# Patient Record
Sex: Female | Born: 1965 | Race: White | Hispanic: No | Smoking: Current every day smoker
Health system: Southern US, Community
[De-identification: ages and names within clinical notes are randomized; demographics above are authoritative.]

## PROBLEM LIST (undated history)

## (undated) ENCOUNTER — Ambulatory Visit

## (undated) ENCOUNTER — Encounter
Attending: Student in an Organized Health Care Education/Training Program | Primary: Student in an Organized Health Care Education/Training Program

## (undated) ENCOUNTER — Ambulatory Visit: Payer: MEDICARE

## (undated) ENCOUNTER — Telehealth

## (undated) ENCOUNTER — Telehealth: Attending: Hematology & Oncology | Primary: Hematology & Oncology

## (undated) ENCOUNTER — Ambulatory Visit: Payer: MEDICARE | Attending: Dermatology | Primary: Dermatology

## (undated) DIAGNOSIS — M5090 Cervical disc disorder, unspecified, unspecified cervical region: Secondary | ICD-10-CM

## (undated) DIAGNOSIS — S2239XA Fracture of one rib, unspecified side, initial encounter for closed fracture: Secondary | ICD-10-CM

## (undated) DIAGNOSIS — M4316 Spondylolisthesis, lumbar region: Secondary | ICD-10-CM

## (undated) DIAGNOSIS — M4310 Spondylolisthesis, site unspecified: Secondary | ICD-10-CM

## (undated) DIAGNOSIS — E119 Type 2 diabetes mellitus without complications: Secondary | ICD-10-CM

## (undated) DIAGNOSIS — F329 Major depressive disorder, single episode, unspecified: Secondary | ICD-10-CM

## (undated) DIAGNOSIS — K219 Gastro-esophageal reflux disease without esophagitis: Secondary | ICD-10-CM

## (undated) DIAGNOSIS — F32A Depression, unspecified: Secondary | ICD-10-CM

## (undated) DIAGNOSIS — E1140 Type 2 diabetes mellitus with diabetic neuropathy, unspecified: Secondary | ICD-10-CM

## (undated) DIAGNOSIS — I10 Essential (primary) hypertension: Secondary | ICD-10-CM

## (undated) DIAGNOSIS — J302 Other seasonal allergic rhinitis: Secondary | ICD-10-CM

## (undated) DIAGNOSIS — G971 Other reaction to spinal and lumbar puncture: Secondary | ICD-10-CM

## (undated) DIAGNOSIS — K259 Gastric ulcer, unspecified as acute or chronic, without hemorrhage or perforation: Secondary | ICD-10-CM

## (undated) DIAGNOSIS — M1990 Unspecified osteoarthritis, unspecified site: Secondary | ICD-10-CM

## (undated) HISTORY — PX: CATARACT EXTRACTION: SUR2

## (undated) HISTORY — PX: DIAGNOSTIC LAPAROSCOPY: SUR761

## (undated) HISTORY — PX: BACK SURGERY: SHX140

## (undated) HISTORY — PX: GASTRIC BYPASS: SHX52

## (undated) HISTORY — DX: Spondylolisthesis, lumbar region: M43.16

## (undated) HISTORY — PX: BREAST REDUCTION SURGERY: SHX8

## (undated) HISTORY — PX: APPENDECTOMY: SHX54

## (undated) HISTORY — PX: HERNIA REPAIR: SHX51

## (undated) HISTORY — PX: WISDOM TOOTH EXTRACTION: SHX21

## (undated) HISTORY — PX: CARPAL TUNNEL RELEASE: SHX101

## (undated) HISTORY — PX: COLONOSCOPY W/ BIOPSIES AND POLYPECTOMY: SHX1376

## (undated) HISTORY — PX: OTHER SURGICAL HISTORY: SHX169

## (undated) HISTORY — PX: KNEE ARTHROSCOPY: SUR90

## (undated) HISTORY — PX: JOINT REPLACEMENT: SHX530

## (undated) HISTORY — PX: CHOLECYSTECTOMY: SHX55

## (undated) HISTORY — DX: Cervical disc disorder, unspecified, unspecified cervical region: M50.90

---

## 2005-02-23 ENCOUNTER — Ambulatory Visit: Payer: Self-pay | Admitting: Internal Medicine

## 2005-04-10 ENCOUNTER — Ambulatory Visit: Payer: Self-pay | Admitting: Gastroenterology

## 2005-04-12 ENCOUNTER — Ambulatory Visit: Payer: Self-pay | Admitting: Gastroenterology

## 2005-10-19 ENCOUNTER — Encounter: Payer: Self-pay | Admitting: General Practice

## 2005-11-15 ENCOUNTER — Encounter: Payer: Self-pay | Admitting: General Practice

## 2006-05-08 ENCOUNTER — Emergency Department: Payer: Self-pay | Admitting: Emergency Medicine

## 2006-07-17 ENCOUNTER — Emergency Department: Payer: Self-pay | Admitting: Emergency Medicine

## 2006-11-20 ENCOUNTER — Emergency Department: Payer: Self-pay | Admitting: Emergency Medicine

## 2006-12-17 ENCOUNTER — Emergency Department: Payer: Self-pay | Admitting: Unknown Physician Specialty

## 2007-01-18 ENCOUNTER — Ambulatory Visit: Payer: Self-pay | Admitting: Internal Medicine

## 2007-02-08 ENCOUNTER — Emergency Department: Payer: Self-pay | Admitting: Internal Medicine

## 2007-08-01 ENCOUNTER — Ambulatory Visit: Payer: Self-pay | Admitting: Cardiology

## 2008-08-22 ENCOUNTER — Emergency Department: Payer: Self-pay | Admitting: Internal Medicine

## 2008-10-09 ENCOUNTER — Emergency Department: Payer: Self-pay | Admitting: Emergency Medicine

## 2008-10-14 ENCOUNTER — Ambulatory Visit: Payer: Self-pay | Admitting: Unknown Physician Specialty

## 2008-10-19 ENCOUNTER — Ambulatory Visit: Payer: Self-pay | Admitting: Unknown Physician Specialty

## 2008-10-29 ENCOUNTER — Ambulatory Visit: Payer: Self-pay | Admitting: Unknown Physician Specialty

## 2008-11-23 ENCOUNTER — Ambulatory Visit: Payer: Self-pay | Admitting: Unknown Physician Specialty

## 2009-02-01 ENCOUNTER — Ambulatory Visit: Payer: Self-pay

## 2009-03-11 ENCOUNTER — Ambulatory Visit: Payer: Self-pay

## 2009-04-06 ENCOUNTER — Emergency Department: Payer: Self-pay | Admitting: Emergency Medicine

## 2009-04-12 ENCOUNTER — Ambulatory Visit: Payer: Self-pay | Admitting: Unknown Physician Specialty

## 2009-06-10 ENCOUNTER — Ambulatory Visit: Payer: Self-pay | Admitting: Internal Medicine

## 2009-06-23 ENCOUNTER — Ambulatory Visit: Payer: Self-pay | Admitting: Unknown Physician Specialty

## 2009-08-10 ENCOUNTER — Ambulatory Visit: Payer: Self-pay | Admitting: Ophthalmology

## 2009-08-13 ENCOUNTER — Ambulatory Visit: Payer: Self-pay | Admitting: Unknown Physician Specialty

## 2009-08-23 ENCOUNTER — Ambulatory Visit: Payer: Self-pay | Admitting: Ophthalmology

## 2009-08-25 ENCOUNTER — Ambulatory Visit: Payer: Self-pay | Admitting: Unknown Physician Specialty

## 2009-08-31 ENCOUNTER — Ambulatory Visit: Payer: Self-pay | Admitting: Unknown Physician Specialty

## 2009-09-29 ENCOUNTER — Inpatient Hospital Stay: Payer: Self-pay | Admitting: Internal Medicine

## 2009-10-16 ENCOUNTER — Emergency Department: Payer: Self-pay | Admitting: Internal Medicine

## 2010-10-04 ENCOUNTER — Emergency Department: Payer: Self-pay | Admitting: Emergency Medicine

## 2011-10-12 ENCOUNTER — Emergency Department: Payer: Self-pay | Admitting: Emergency Medicine

## 2012-03-24 ENCOUNTER — Ambulatory Visit: Payer: Self-pay | Admitting: Unknown Physician Specialty

## 2012-05-01 ENCOUNTER — Ambulatory Visit: Payer: Self-pay | Admitting: Specialist

## 2012-05-08 ENCOUNTER — Ambulatory Visit: Payer: Self-pay | Admitting: Specialist

## 2012-05-08 DIAGNOSIS — I10 Essential (primary) hypertension: Secondary | ICD-10-CM

## 2012-05-08 LAB — COMPREHENSIVE METABOLIC PANEL
Alkaline Phosphatase: 82 U/L (ref 50–136)
Anion Gap: 8 (ref 7–16)
Bilirubin,Total: 0.3 mg/dL (ref 0.2–1.0)
Chloride: 106 mmol/L (ref 98–107)
Co2: 27 mmol/L (ref 21–32)
Creatinine: 0.7 mg/dL (ref 0.60–1.30)
EGFR (African American): >60
EGFR (Non-African Amer.): >60
Glucose: 162 mg/dL — ABNORMAL HIGH (ref 65–99)
Osmolality: 284 (ref 275–301)
Potassium: 3.9 mmol/L (ref 3.5–5.1)
SGPT (ALT): 31 U/L
Sodium: 141 mmol/L (ref 136–145)

## 2012-05-08 LAB — CBC WITH DIFFERENTIAL/PLATELET
Basophil %: 0.7 %
Eosinophil #: 0.5 10*3/uL (ref 0.0–0.7)
Eosinophil %: 3.9 %
HGB: 14.1 g/dL (ref 12.0–16.0)
Lymphocyte %: 28.7 %
MCH: 28.9 pg (ref 26.0–34.0)
MCHC: 33.5 g/dL (ref 32.0–36.0)
MCV: 86 fL (ref 80–100)
Monocyte #: 0.7 x10 3/mm (ref 0.2–0.9)
Neutrophil #: 7.4 10*3/uL — ABNORMAL HIGH (ref 1.4–6.5)
RBC: 4.88 10*6/uL (ref 3.80–5.20)
RDW: 14.2 % (ref 11.5–14.5)

## 2012-05-08 LAB — IRON AND TIBC
Iron Bind.Cap.(Total): 330 ug/dL (ref 250–450)
Iron Saturation: 17 %
Iron: 56 ug/dL (ref 50–170)

## 2012-05-08 LAB — BILIRUBIN, DIRECT: Bilirubin, Direct: <0.05 mg/dL (ref 0.00–0.20)

## 2012-05-08 LAB — LIPASE, BLOOD: Lipase: 105 U/L (ref 73–393)

## 2012-05-08 LAB — PROTIME-INR
INR: 0.8
Prothrombin Time: 11.5 secs (ref 11.5–14.7)

## 2012-05-08 LAB — AMYLASE: Amylase: 35 U/L (ref 25–115)

## 2012-05-08 LAB — HEMOGLOBIN A1C: Hemoglobin A1C: 8.4 % — ABNORMAL HIGH (ref 4.2–6.3)

## 2012-06-09 ENCOUNTER — Ambulatory Visit: Payer: Self-pay | Admitting: Specialist

## 2012-06-18 ENCOUNTER — Ambulatory Visit: Payer: Self-pay | Admitting: Unknown Physician Specialty

## 2012-06-18 ENCOUNTER — Other Ambulatory Visit: Payer: Self-pay | Admitting: Internal Medicine

## 2012-06-18 LAB — COMPREHENSIVE METABOLIC PANEL
Albumin: 3.1 g/dL — ABNORMAL LOW (ref 3.4–5.0)
Alkaline Phosphatase: 74 U/L (ref 50–136)
Anion Gap: 7 (ref 7–16)
BUN: 15 mg/dL (ref 7–18)
Bilirubin,Total: 0.2 mg/dL (ref 0.2–1.0)
Chloride: 103 mmol/L (ref 98–107)
Creatinine: 0.91 mg/dL (ref 0.60–1.30)
Glucose: 252 mg/dL — ABNORMAL HIGH (ref 65–99)
Osmolality: 279 (ref 275–301)
SGOT(AST): 23 U/L (ref 15–37)
SGPT (ALT): 21 U/L (ref 12–78)
Sodium: 135 mmol/L — ABNORMAL LOW (ref 136–145)
Total Protein: 7 g/dL (ref 6.4–8.2)

## 2012-06-18 LAB — URINALYSIS, COMPLETE
Bilirubin,UR: NEGATIVE
Glucose,UR: 50 mg/dL (ref 0–75)
Ketone: NEGATIVE
Protein: NEGATIVE
WBC UR: 6 /HPF (ref 0–5)

## 2012-06-18 LAB — HEMOGLOBIN A1C: Hemoglobin A1C: 8.7 % — ABNORMAL HIGH (ref 4.2–6.3)

## 2012-06-18 LAB — CBC WITH DIFFERENTIAL/PLATELET
Basophil %: 1.3 %
HCT: 39.4 % (ref 35.0–47.0)
HGB: 13.4 g/dL (ref 12.0–16.0)
Lymphocyte #: 3.5 10*3/uL (ref 1.0–3.6)
Lymphocyte %: 24.5 %
MCH: 29.3 pg (ref 26.0–34.0)
Monocyte %: 5.8 %
Neutrophil #: 8.4 10*3/uL — ABNORMAL HIGH (ref 1.4–6.5)
Platelet: 199 10*3/uL (ref 150–440)
RBC: 4.56 10*6/uL (ref 3.80–5.20)
RDW: 14.3 % (ref 11.5–14.5)
WBC: 14.1 10*3/uL — ABNORMAL HIGH (ref 3.6–11.0)

## 2012-06-18 LAB — PROTIME-INR: Prothrombin Time: 12.9 secs (ref 11.5–14.7)

## 2012-06-18 LAB — LIPID PANEL

## 2012-06-18 LAB — MRSA PCR SCREENING

## 2012-06-19 ENCOUNTER — Ambulatory Visit: Payer: Self-pay | Admitting: Specialist

## 2012-06-25 ENCOUNTER — Ambulatory Visit: Payer: Self-pay | Admitting: Gastroenterology

## 2012-07-02 ENCOUNTER — Inpatient Hospital Stay: Payer: Self-pay | Admitting: Unknown Physician Specialty

## 2012-07-02 LAB — ELECTROLYTE PANEL
Anion Gap: 8 (ref 7–16)
Chloride: 107 mmol/L (ref 98–107)
Potassium: 4.4 mmol/L (ref 3.5–5.1)

## 2012-07-03 LAB — BASIC METABOLIC PANEL
Anion Gap: 10 (ref 7–16)
BUN: 18 mg/dL (ref 7–18)
Calcium, Total: 7.9 mg/dL — ABNORMAL LOW (ref 8.5–10.1)
Chloride: 102 mmol/L (ref 98–107)
EGFR (Non-African Amer.): >60
Glucose: 253 mg/dL — ABNORMAL HIGH (ref 65–99)
Osmolality: 281 (ref 275–301)
Potassium: 4.2 mmol/L (ref 3.5–5.1)

## 2012-07-04 LAB — COMPREHENSIVE METABOLIC PANEL
Albumin: 2.9 g/dL — ABNORMAL LOW (ref 3.4–5.0)
Alkaline Phosphatase: 71 U/L (ref 50–136)
Anion Gap: 11 (ref 7–16)
BUN: 7 mg/dL (ref 7–18)
Bilirubin,Total: 0.4 mg/dL (ref 0.2–1.0)
Glucose: 245 mg/dL — ABNORMAL HIGH (ref 65–99)
Osmolality: 282 (ref 275–301)
Potassium: 4.1 mmol/L (ref 3.5–5.1)
Sodium: 138 mmol/L (ref 136–145)
Total Protein: 6.8 g/dL (ref 6.4–8.2)

## 2012-07-04 LAB — CBC WITH DIFFERENTIAL/PLATELET
Basophil #: 0.1 10*3/uL (ref 0.0–0.1)
Eosinophil %: 7.8 %
Lymphocyte %: 17.8 %
MCH: 28.7 pg (ref 26.0–34.0)
MCHC: 32.9 g/dL (ref 32.0–36.0)
MCV: 87 fL (ref 80–100)
Monocyte #: 1.6 x10 3/mm — ABNORMAL HIGH (ref 0.2–0.9)
Monocyte %: 11.1 %
Neutrophil %: 62.4 %
Platelet: 173 10*3/uL (ref 150–440)

## 2012-07-04 LAB — TROPONIN I: Troponin-I: <0.02 ng/mL

## 2012-12-06 LAB — CBC
HCT: 42.6 % (ref 35.0–47.0)
HGB: 14.1 g/dL (ref 12.0–16.0)
MCH: 27.6 pg (ref 26.0–34.0)
MCHC: 33.1 g/dL (ref 32.0–36.0)
MCV: 84 fL (ref 80–100)
WBC: 14.7 10*3/uL — ABNORMAL HIGH (ref 3.6–11.0)

## 2012-12-06 LAB — BASIC METABOLIC PANEL
Anion Gap: 9 (ref 7–16)
Chloride: 99 mmol/L (ref 98–107)
EGFR (Non-African Amer.): >60
Osmolality: 280 (ref 275–301)
Potassium: 4.2 mmol/L (ref 3.5–5.1)
Sodium: 132 mmol/L — ABNORMAL LOW (ref 136–145)

## 2012-12-07 ENCOUNTER — Inpatient Hospital Stay: Payer: Self-pay | Admitting: Surgery

## 2012-12-12 LAB — CULTURE, BLOOD (SINGLE)

## 2012-12-25 ENCOUNTER — Other Ambulatory Visit: Payer: Self-pay | Admitting: Specialist

## 2012-12-26 ENCOUNTER — Ambulatory Visit: Payer: Self-pay | Admitting: Specialist

## 2013-07-30 ENCOUNTER — Ambulatory Visit: Payer: Self-pay | Admitting: Specialist

## 2013-08-15 ENCOUNTER — Ambulatory Visit: Payer: Self-pay | Admitting: Specialist

## 2014-02-08 DIAGNOSIS — M5090 Cervical disc disorder, unspecified, unspecified cervical region: Secondary | ICD-10-CM

## 2014-02-08 DIAGNOSIS — E109 Type 1 diabetes mellitus without complications: Secondary | ICD-10-CM | POA: Insufficient documentation

## 2014-02-08 DIAGNOSIS — F17200 Nicotine dependence, unspecified, uncomplicated: Secondary | ICD-10-CM | POA: Insufficient documentation

## 2014-02-08 DIAGNOSIS — G939 Disorder of brain, unspecified: Secondary | ICD-10-CM | POA: Insufficient documentation

## 2014-02-08 DIAGNOSIS — I6782 Cerebral ischemia: Secondary | ICD-10-CM | POA: Insufficient documentation

## 2014-02-08 DIAGNOSIS — I10 Essential (primary) hypertension: Secondary | ICD-10-CM | POA: Insufficient documentation

## 2014-02-08 HISTORY — DX: Cervical disc disorder, unspecified, unspecified cervical region: M50.90

## 2014-04-09 ENCOUNTER — Ambulatory Visit: Payer: Self-pay | Admitting: Internal Medicine

## 2014-04-23 ENCOUNTER — Ambulatory Visit: Payer: Self-pay | Admitting: Internal Medicine

## 2014-04-23 ENCOUNTER — Emergency Department: Payer: Self-pay | Admitting: Emergency Medicine

## 2014-04-23 LAB — COMPREHENSIVE METABOLIC PANEL
ALBUMIN: 3.2 g/dL — AB (ref 3.4–5.0)
ALK PHOS: 80 U/L
ANION GAP: 8 (ref 7–16)
BUN: 8 mg/dL (ref 7–18)
Bilirubin,Total: 0.3 mg/dL (ref 0.2–1.0)
Calcium, Total: 8.9 mg/dL (ref 8.5–10.1)
Chloride: 104 mmol/L (ref 98–107)
Co2: 26 mmol/L (ref 21–32)
Creatinine: 0.9 mg/dL (ref 0.60–1.30)
EGFR (African American): >60
EGFR (Non-African Amer.): >60
GLUCOSE: 162 mg/dL — AB (ref 65–99)
Osmolality: 278 (ref 275–301)
Potassium: 3.4 mmol/L — ABNORMAL LOW (ref 3.5–5.1)
SGOT(AST): 17 U/L (ref 15–37)
SGPT (ALT): 15 U/L (ref 12–78)
Sodium: 138 mmol/L (ref 136–145)
Total Protein: 7.8 g/dL (ref 6.4–8.2)

## 2014-04-23 LAB — CBC WITH DIFFERENTIAL/PLATELET
BASOS PCT: 0.8 %
Basophil #: 0.1 10*3/uL (ref 0.0–0.1)
Eosinophil #: 0.5 10*3/uL (ref 0.0–0.7)
Eosinophil %: 4.1 %
HCT: 46.5 % (ref 35.0–47.0)
HGB: 15.2 g/dL (ref 12.0–16.0)
Lymphocyte #: 3.8 10*3/uL — ABNORMAL HIGH (ref 1.0–3.6)
Lymphocyte %: 28.1 %
MCH: 27.4 pg (ref 26.0–34.0)
MCHC: 32.7 g/dL (ref 32.0–36.0)
MCV: 84 fL (ref 80–100)
MONOS PCT: 6.3 %
Monocyte #: 0.9 x10 3/mm (ref 0.2–0.9)
Neutrophil #: 8.2 10*3/uL — ABNORMAL HIGH (ref 1.4–6.5)
Neutrophil %: 60.7 %
PLATELETS: 222 10*3/uL (ref 150–440)
RBC: 5.54 10*6/uL — ABNORMAL HIGH (ref 3.80–5.20)
RDW: 14.1 % (ref 11.5–14.5)
WBC: 13.5 10*3/uL — ABNORMAL HIGH (ref 3.6–11.0)

## 2014-04-23 LAB — URINALYSIS, COMPLETE
Blood: NEGATIVE
GLUCOSE, UR: NEGATIVE mg/dL (ref 0–75)
Ketone: NEGATIVE
Nitrite: NEGATIVE
Ph: 5 (ref 4.5–8.0)
Protein: NEGATIVE
RBC,UR: 9 /HPF (ref 0–5)
SPECIFIC GRAVITY: 1.029 (ref 1.003–1.030)
Squamous Epithelial: 15
WBC UR: 38 /HPF (ref 0–5)

## 2014-04-23 LAB — LIPASE, BLOOD: LIPASE: 86 U/L (ref 73–393)

## 2014-05-10 ENCOUNTER — Encounter: Payer: Self-pay | Admitting: Neurology

## 2014-05-15 ENCOUNTER — Encounter: Payer: Self-pay | Admitting: Neurology

## 2014-06-07 ENCOUNTER — Ambulatory Visit: Payer: Self-pay | Admitting: Surgery

## 2014-06-07 LAB — POTASSIUM: Potassium: 4.4 mmol/L (ref 3.5–5.1)

## 2014-06-10 ENCOUNTER — Ambulatory Visit: Payer: Self-pay | Admitting: Surgery

## 2014-06-11 LAB — PATHOLOGY REPORT

## 2014-06-15 ENCOUNTER — Encounter: Payer: Self-pay | Admitting: Neurology

## 2014-06-24 ENCOUNTER — Emergency Department: Payer: Self-pay | Admitting: Emergency Medicine

## 2014-06-24 LAB — COMPREHENSIVE METABOLIC PANEL
ALBUMIN: 3.2 g/dL — AB (ref 3.4–5.0)
ALT: 22 U/L
AST: 23 U/L (ref 15–37)
Alkaline Phosphatase: 74 U/L
Anion Gap: 9 (ref 7–16)
BUN: 8 mg/dL (ref 7–18)
Bilirubin,Total: 0.2 mg/dL (ref 0.2–1.0)
CALCIUM: 8.9 mg/dL (ref 8.5–10.1)
CHLORIDE: 110 mmol/L — AB (ref 98–107)
CO2: 22 mmol/L (ref 21–32)
CREATININE: 0.78 mg/dL (ref 0.60–1.30)
EGFR (Non-African Amer.): >60
Glucose: 135 mg/dL — ABNORMAL HIGH (ref 65–99)
OSMOLALITY: 282 (ref 275–301)
Potassium: 4 mmol/L (ref 3.5–5.1)
Sodium: 141 mmol/L (ref 136–145)
Total Protein: 7.4 g/dL (ref 6.4–8.2)

## 2014-06-24 LAB — URINALYSIS, COMPLETE
BILIRUBIN, UR: NEGATIVE
BLOOD: NEGATIVE
GLUCOSE, UR: NEGATIVE mg/dL (ref 0–75)
Ketone: NEGATIVE
NITRITE: NEGATIVE
PH: 5 (ref 4.5–8.0)
Protein: NEGATIVE
SPECIFIC GRAVITY: 1.018 (ref 1.003–1.030)
Squamous Epithelial: 9
WBC UR: 40 /HPF (ref 0–5)

## 2014-06-24 LAB — LIPASE, BLOOD: LIPASE: 95 U/L (ref 73–393)

## 2014-06-24 LAB — CBC
HCT: 47.5 % — ABNORMAL HIGH (ref 35.0–47.0)
HGB: 15.5 g/dL (ref 12.0–16.0)
MCH: 28.2 pg (ref 26.0–34.0)
MCHC: 32.7 g/dL (ref 32.0–36.0)
MCV: 86 fL (ref 80–100)
PLATELETS: 225 10*3/uL (ref 150–440)
RBC: 5.51 10*6/uL — ABNORMAL HIGH (ref 3.80–5.20)
RDW: 14.1 % (ref 11.5–14.5)
WBC: 10.5 10*3/uL (ref 3.6–11.0)

## 2014-06-29 ENCOUNTER — Ambulatory Visit: Payer: Self-pay | Admitting: Surgery

## 2014-07-30 ENCOUNTER — Ambulatory Visit: Payer: Self-pay | Admitting: Gastroenterology

## 2014-08-31 ENCOUNTER — Emergency Department: Payer: Self-pay | Admitting: Emergency Medicine

## 2014-08-31 LAB — URINALYSIS, COMPLETE
Bilirubin,UR: NEGATIVE
Blood: NEGATIVE
Glucose,UR: NEGATIVE mg/dL (ref 0–75)
KETONE: NEGATIVE
Leukocyte Esterase: NEGATIVE
NITRITE: NEGATIVE
PH: 5 (ref 4.5–8.0)
Protein: NEGATIVE
SPECIFIC GRAVITY: 1.014 (ref 1.003–1.030)
WBC UR: 3 /HPF (ref 0–5)

## 2014-08-31 LAB — CBC WITH DIFFERENTIAL/PLATELET
Basophil #: 0.1 10*3/uL (ref 0.0–0.1)
Basophil %: 0.9 %
EOS ABS: 1.1 10*3/uL — AB (ref 0.0–0.7)
Eosinophil %: 8.1 %
HCT: 50 % — ABNORMAL HIGH (ref 35.0–47.0)
HGB: 16.3 g/dL — AB (ref 12.0–16.0)
Lymphocyte #: 3.5 10*3/uL (ref 1.0–3.6)
Lymphocyte %: 26.8 %
MCH: 28.2 pg (ref 26.0–34.0)
MCHC: 32.6 g/dL (ref 32.0–36.0)
MCV: 87 fL (ref 80–100)
Monocyte #: 0.7 x10 3/mm (ref 0.2–0.9)
Monocyte %: 4.9 %
NEUTROS ABS: 7.9 10*3/uL — AB (ref 1.4–6.5)
Neutrophil %: 59.3 %
Platelet: 254 10*3/uL (ref 150–440)
RBC: 5.77 10*6/uL — AB (ref 3.80–5.20)
RDW: 13.2 % (ref 11.5–14.5)
WBC: 13.2 10*3/uL — ABNORMAL HIGH (ref 3.6–11.0)

## 2014-08-31 LAB — COMPREHENSIVE METABOLIC PANEL
ALBUMIN: 3.9 g/dL (ref 3.4–5.0)
ALT: 25 U/L
Alkaline Phosphatase: 79 U/L
Anion Gap: 4 — ABNORMAL LOW (ref 7–16)
BUN: 9 mg/dL (ref 7–18)
Bilirubin,Total: 0.4 mg/dL (ref 0.2–1.0)
CO2: 27 mmol/L (ref 21–32)
Calcium, Total: 8.8 mg/dL (ref 8.5–10.1)
Chloride: 105 mmol/L (ref 98–107)
Creatinine: 0.9 mg/dL (ref 0.60–1.30)
Glucose: 134 mg/dL — ABNORMAL HIGH (ref 65–99)
OSMOLALITY: 273 (ref 275–301)
Potassium: 3.8 mmol/L (ref 3.5–5.1)
SGOT(AST): 17 U/L (ref 15–37)
SODIUM: 136 mmol/L (ref 136–145)
TOTAL PROTEIN: 8.1 g/dL (ref 6.4–8.2)

## 2014-08-31 LAB — LIPASE, BLOOD: Lipase: 85 U/L (ref 73–393)

## 2014-09-02 LAB — URINE CULTURE

## 2014-09-23 ENCOUNTER — Emergency Department: Payer: Self-pay | Admitting: Emergency Medicine

## 2014-09-23 LAB — COMPREHENSIVE METABOLIC PANEL
ALBUMIN: 3.5 g/dL (ref 3.4–5.0)
ANION GAP: 7 (ref 7–16)
AST: 23 U/L (ref 15–37)
Alkaline Phosphatase: 84 U/L
BILIRUBIN TOTAL: 0.4 mg/dL (ref 0.2–1.0)
BUN: 7 mg/dL (ref 7–18)
CO2: 27 mmol/L (ref 21–32)
CREATININE: 0.79 mg/dL (ref 0.60–1.30)
Calcium, Total: 8.6 mg/dL (ref 8.5–10.1)
Chloride: 106 mmol/L (ref 98–107)
GLUCOSE: 153 mg/dL — AB (ref 65–99)
Osmolality: 280 (ref 275–301)
Potassium: 3.8 mmol/L (ref 3.5–5.1)
SGPT (ALT): 26 U/L
Sodium: 140 mmol/L (ref 136–145)
Total Protein: 8 g/dL (ref 6.4–8.2)

## 2014-09-23 LAB — CBC WITH DIFFERENTIAL/PLATELET
BASOS ABS: 0.2 10*3/uL — AB (ref 0.0–0.1)
Basophil %: 1.4 %
Eosinophil #: 0.6 10*3/uL (ref 0.0–0.7)
Eosinophil %: 5.2 %
HCT: 50.4 % — AB (ref 35.0–47.0)
HGB: 16.8 g/dL — ABNORMAL HIGH (ref 12.0–16.0)
LYMPHS PCT: 38.1 %
Lymphocyte #: 4.1 10*3/uL — ABNORMAL HIGH (ref 1.0–3.6)
MCH: 28.9 pg (ref 26.0–34.0)
MCHC: 33.3 g/dL (ref 32.0–36.0)
MCV: 87 fL (ref 80–100)
MONO ABS: 0.7 x10 3/mm (ref 0.2–0.9)
Monocyte %: 6.2 %
NEUTROS ABS: 5.3 10*3/uL (ref 1.4–6.5)
Neutrophil %: 49.1 %
Platelet: 243 10*3/uL (ref 150–440)
RBC: 5.82 10*6/uL — ABNORMAL HIGH (ref 3.80–5.20)
RDW: 13.4 % (ref 11.5–14.5)
WBC: 10.8 10*3/uL (ref 3.6–11.0)

## 2014-09-23 LAB — TROPONIN I

## 2014-09-30 ENCOUNTER — Ambulatory Visit: Payer: Self-pay | Admitting: Specialist

## 2014-10-01 ENCOUNTER — Ambulatory Visit: Payer: Self-pay | Admitting: Gastroenterology

## 2014-10-18 DIAGNOSIS — L732 Hidradenitis suppurativa: Secondary | ICD-10-CM | POA: Insufficient documentation

## 2014-10-28 ENCOUNTER — Ambulatory Visit: Payer: Self-pay | Admitting: Obstetrics and Gynecology

## 2014-12-22 ENCOUNTER — Emergency Department: Payer: Self-pay | Admitting: Emergency Medicine

## 2015-02-01 NOTE — Consult Note (Signed)
PATIENT NAME:  Brittany Cummings, Brittany Cummings MR#:  [id number] DATE OF BIRTH:  September 01, 1966  DATE OF CONSULTATION:  07/03/2012  REFERRING PHYSICIAN:  Hessie Knows, MD CONSULTING PHYSICIAN:  Rusty Aus, MD  REASON FOR CONSULTATION:  Elevated heart rate.   HISTORY OF PRESENT ILLNESS: The patient is day one postop total knee replacement. She has a history of significant smoking and diabetes. She is in significant pain this morning postop. She notes no chest pain or shortness of breath. Heart rate is running in the 120-130 range, regular. Systolic pressure is 333 to 120. She notes no shortness of breath, no abdominal pain, no headache. She has a history of elevated blood pressure typically control with Azor.  She notes no neck pain.   PAST MEDICAL HISTORY/MEDICAL ILLNESSES:  1. Tobacco abuse.  2. Cervical disk disease.  3. Anxiety/depression.  4. Diabetes mellitus, type 2 complicated by diabetic neuropathy.   PAST SURGICAL HISTORY:  1. Lumbar disk.  2. Breast reduction.  3. Appendectomy.  4. Left knee surgery 2010.  5. Cataract surgery 2008.   ALLERGIES: Demerol.   MEDICATIONS: 1. Azor 10/40 daily.  2. NovoLog Mix 70/30, 50 units twice daily.  3. Cymbalta 60 mg b.i.d.  4. Protonix 40 mg daily.  5. HCTZ 25 mg daily p.r.n.  6. Glimepiride 4 mg daily.  7. Flonase two sprays daily.   SOCIAL HISTORY: Married. Smokes 1 to 2 packs a day. She is a Marine scientist.   FAMILY HISTORY: Mother with breast cancer.   REVIEW OF SYSTEMS: Otherwise negative.   PHYSICAL EXAMINATION:  VITAL SIGNS: Blood pressure 115/75, pulse 130 and regular.   HEENT: Normal oropharynx.   LUNGS: Clear to auscultation and percussion.   HEART: Tachycardic, regular rhythm. No audible murmur.   ABDOMEN: Soft and nontender.   EXTREMITIES: No edema.   NEUROLOGICAL: Nonfocal.   ASSESSMENT AND PLAN:  1. Sinus tachycardia: Likely reactive to pain. With her lower blood pressures we will use low dose beta blocker but hold her other  blood pressure medications. Administer IV fluids. Some of this could be nicotine withdrawal. We will put a nicotine patch on her. Follow closely. No signs for ischemia.  No sign for significant dyspnea. The likelihood of pulmonary embolism is very low.  2. Diabetes: Sliding scale insulin.  ____________________________ Rusty Aus, MD mfm:bjt D: 07/03/2012 08:21:42 ET T: 07/03/2012 10:31:18 ET JOB#: [id number]  cc: Laurene Footman, MD Rusty Aus MD ELECTRONICALLY SIGNED 07/04/2012 8:00

## 2015-02-01 NOTE — Discharge Summary (Signed)
PATIENT NAME:  Brittany Cummings, Brittany Cummings MR#:  [id number] DATE OF BIRTH:  1966-08-09  DATE OF ADMISSION:  07/02/2012 DATE OF DISCHARGE:  07/05/2012  ADMITTING DIAGNOSIS: Degenerative arthrosis of the left knee.   DISCHARGE DIAGNOSES: Degenerative arthrosis of the left knee, tachycardia.  CONSULTATION: Emily Filbert, MD  HISTORY: The patient is a 49 year old female who has been followed at [facility] for progression of left knee pain. She was noted to have significant tricompartmental arthritic changes. MRI also was consistent with tricompartmental degenerative changes. The patient did not see any improvement in her condition despite conservative care. After discussion of the risks and benefits of surgical intervention, the patient expressed her understanding of the risks and benefits and agreed for plans for surgical intervention.   PROCEDURE: Left total knee arthroplasty.   ANESTHESIA: Spinal.   IMPLANTS UTILIZED: Stryker PS.  A #7 tibial baseplate cemented, a #8 femoral component cemented, a 10 mm thick resurfacing patellar component.  HOSPITAL COURSE: The patient tolerated the procedure very well. She had no complications. She was then taken to PACU where she was stabilized and then transferred to the orthopedic floor. She began receiving anticoagulation therapy of Lovenox 30 mg subcutaneous every 12 hours per anesthesia protocol. She was fitted with TED stockings bilaterally. These were allowed to be removed 1 hour per 8-hour shift. The left one was applied on day 2 following removal of the Hemovac and the dressing change. She was also fitted with the AV-I compression foot pumps set at 80 mmHg. Her calves have been nontender. There has been no evidence of any deep venous thromboses.   The patient has denied any chest pains or shortness of breath. Vital signs were stable except for tachycardia. She was noted to run 120 to 130. Subsequently, a medical consultation was obtained by Dr. Emily Filbert. He felt that this was secondary to pain and was placed on a beta blocker with her blood pressure medications being reduced secondary to the fact that she was running normotensive. She seemed to tolerate this very well and has had no problems. The patient has been very independent with her activities. The first night she was noted to get up and go to the bathroom herself by disconnecting the bed alarm, Polar Care, and other devices. She has progressed very well. There were no transfusions needed on this visitation. She has been afebrile.   Physical therapy was initiated on day 1 for ADLs and assistive devices. Upon being discharged was ambulating greater than 200 feet. Was able to go up and down four sets of steps. Was independent with bed to chair transfers. Occupational therapy was also initiated on day 1 for ADLs and assistive devices.   The patient's IV, Foley, and Hemovac were discontinued on day 2 along with dressing change. The wound was free of any drainage or signs of infection.   DISPOSITION: The patient is being discharged to home in improved, stable condition. She  needs to weight bear as tolerated. Continue using a walker until cleared by Physical Therapy to go to a quad cane. She will receive home health with PT. She was instructed on wound care. She is to continue wearing TED stockings. These are to be worn during the day but may be removed at night. She is also to continue the Polar Care. Recommend that she try to use this much as  she can around the clock.  She is placed on an ADA diet. She is to follow up with Dr. Joneen Boers  [facility] Thursday in the office. Will need to call for this appointment.   She is to resume her regular medications that she was on prior to admission. She was given a prescription for Roxicodone 5 to 10 mg q.4 to 6 hours p.r.n. for pain and Lovenox 40 mg 1 injection subcutaneously daily for 14 days, then discontinue and begin taking 181 mg enteric-coated aspirin.    PAST MEDICAL HISTORY:  Depression, hypertension, diabetes mellitus, glaucoma, migraines, scoliosis, H. pylori  with treatment.    ____________________________ Vance Peper, PA jrw:vtd D: 07/05/2012 07:14:22 ET T: 07/07/2012 12:24:17 ET JOB#: [id number] cc: Vance Peper, PA, <Dictator> Stonewall Doss PA ELECTRONICALLY SIGNED 07/07/2012 14:10

## 2015-02-01 NOTE — Op Note (Signed)
PATIENT NAME:  Brittany Cummings, Brittany Cummings MR#:  [id number] DATE OF BIRTH:  1965/12/06  DATE OF PROCEDURE:  07/02/2012  PREOPERATIVE DIAGNOSIS: Severe degenerative arthritis, left knee.   POSTOPERATIVE DIAGNOSIS: Severe degenerative arthritis, left knee.   PROCEDURE: Stryker PS total knee replacement on the left.   SURGEON: Kathrene Alu., MD  ANESTHESIA: Spinal.   HISTORY: The patient had a long history of symptomatic degenerative arthritis of the left knee. Plain films revealed tricompartmental degenerative arthritis. MRI confirmed this. Due to worsening of her symptoms despite conservative treatment she was brought in for a total knee replacement on the left.   DESCRIPTION OF PROCEDURE: Patient taken to the Operating Room where satisfactory spinal anesthesia was achieved. A tourniquet was applied to her left upper thigh and the left lower extremity was prepped and draped in the usual fashion for a total knee procedure. Incidentally, the patient was given 2 grams of Kefzol IV prior to the start of the procedure. Next, the left lower extremity was exsanguinated and the tourniquet was inflated. Slightly curved longitudinal incision was made over the anterior aspect of the left knee joint. Dissection was carried down through the subcutaneous tissue onto the extensor mechanism. The vastus medialis was divided in line with its fibers about a fingerbreadth above the superomedial pole of the patella. The joint was entered. Dissection was carried down along the medial border of the patellar and patellar tendon. The knee was flexed, patella was everted and dislocated laterally.   Inspection of the knee joint revealed the patient indeed had rather severe degenerative arthritis of the medial compartment and moderately severe degenerative arthritis of the lateral compartment. There was erosion of her medial and lateral tibial plateaus. The patient had extensive osteophytes about the periphery of her distal femur  and patella. Osteophytes were excised. I went ahead and made and 3 inch hole in the trochlear groove of the femur about a centimeter anterior to the insertion of the posterior cruciate ligament on the distal femur. Intramedullary rod was inserted. On the distal end of the rod was an alignment jig and cutting block. After the alignment jig was appropriately positioned, cutting block was fixed to the distal femur with smooth pins. The intramedullary rod and alignment jig were removed and the distal femoral cut was made. We templated the distal femur for a #8 femoral component. Starting holes were made in the distal femur using the appropriate jig. They were placed in slight external rotation. I then impacted the femoral cutting block onto the distal femur and made my anterior and posterior cuts along with our anterior and posterior chamfer cuts. The appropriate jig was used to notch out the trochlear groove. The anterior cruciate ligament and posterior cruciate ligament were removed at this time. The knee was hyperflexed. Meniscal remnants were excised. A three-eighths inch hole was made in the midline of the tibia junction of the anterior one third and posterior two thirds. Intramedullary rod was inserted. On the proximal end of the rod was a 5 degrees sloped cutting block. It was positioned for a cut 4 mm below the lowest portion of the lateral tibial plateau. After the cutting block was aligned, it was fixed to the proximal tibia with smooth pins and then the proximal tibia cut was made. The tibial cut appeared to be too shallow so we actually made two more cuts with each cut being 2 mm lower than the one before it. Finally I was able to create enough space to insert our  trials. With the #8 femoral component in place and a #7 tibial baseplate and 8 mm spacer in place, the knee came to full extension. It seemed to be quite stable. I actually was able to insert a 10 mm spacer and achieve the same result.   At this  time the tourniquet was released. I went ahead and removed about 6 mm of bone from the retropatellar surface. We trialed the retropatellar surface for a #7 resurfacing patellar component. Appropriate holes were made for the trial with the trial in place, the patella tracked well. After the tourniquet had been down about 12 minutes the left lower extremity was re-exsanguinated and the tourniquet was inflated. The trial components were removed. I did use the tibial baseplate as a guide for our cruciate cut in the proximal tibia. After this was made, jet lavage was used to remove blood from the cut cancellous surfaces. I do need to add that I had previously used a curved osteotome to osteotomize the posterior aspect of the medial and lateral femoral condyles removing some osteophytes. An elevator was used to slightly elevate the posterior capsule from its attachment on the distal femur.   Next, the components were sequentially implanted. The #7 tibial baseplate was impacted onto the proximal tibia with methylmethacrylate and the #8 femoral component was impacted onto the distal femur with methylmethacrylate. Excess glue was removed. We went ahead and mixed up another batch of glue for the patella. The #7 10-mm thick resurfacing patellar component was glued to the retropatellar surface and then a trial 8 mm tibial bearing insert was inserted. The trial #7 8 mm thick tibial bearing insert was impacted on the tibial baseplate. After the glue had set up, I removed the trial, and then inserted the permanent #7 8 mm thick tibial bearing insert. This was a PS insert.   The knee moved well at this time and was quite stable. The tourniquet was released. It was up 82 minutes initially, and the second time it was up 49 minutes for a total of 131 minutes.   The wound was re-irrigated using jet lavage. Bleeding was controlled with coagulation cautery. The extensor mechanism was closed with #1 Ethibond and #1 Vicryl sutures.  The subcutaneous was closed with 2-0 Vicryl and the skin with skin staples. Betadine was applied to the wound followed by four TENS pads and sterile dressing. A Polar Care cooling pad was placed around the left knee and then a knee immobilizer was applied.   The patient was then transferred to her hospital bed and taken to the recovery room in satisfactory condition. Estimated blood loss was about 150 to 200 mL. No blood was transfused during the course of the procedure.   SUMMARY OF IMPLANTS USED: A #8 PS femoral component, a #7 standard tibial tray, a #7 8-mm PS tibial bearing insert, and a #7 10-mm thick resurfacing patellar component.   ____________________________ Kathrene Alu., MD hbk:cms D: 07/02/2012 18:19:54 ET T: 07/03/2012 10:57:35 ET JOB#: [id number]  cc: Kathrene Alu., MD, <Dictator>  Vilinda Flake, JR MD ELECTRONICALLY SIGNED 08/19/2012 8:39

## 2015-02-04 NOTE — Op Note (Signed)
PATIENT NAME:  Brittany Cummings, Brittany Cummings MR#:  [id number] DATE OF BIRTH:  1965/11/28  DATE OF PROCEDURE:  12/07/2012  PREOPERATIVE DIAGNOSIS: Left groin abscess   POSTOPERATIVE DIAGNOSIS: Left groin abscess with some necrotic fascia.   OPERATION PERFORMED: Incision and drainage of abscess and debridement of necrotic fascia, left groin.   SURGEON: Consuela Mimes, MD   ANESTHESIA: General.   PROCEDURE IN DETAIL: The patient was placed supine on the Operating Room table and prepped and draped in the usual sterile fashion. I probed the external opening, and it went cephalad and lateral; therefore, I opened the skin in this area which went across the groin crease and down through the extensive subcutaneous tissue into the abscess cavity. This needed to be enlarged a little bit medially as well. There was some necrotic fascia at the base of the incision, and this was debrided and sent for tissue culture. All of the necrotic tissue was removed. The patient did not have necrotizing fasciitis. Hemostasis was achieved with the electrocautery, and after it was ascertained that all of the pus was adequately drained, and all of the tissue was viable, the wound was irrigated with hydrogen peroxide, and this was suctioned clear, and it was then packed with a saline-soaked Kerlix gauze. The patient tolerated the procedure well. There were no complications.    ____________________________ Consuela Mimes, MD wfm:cb D: 12/07/2012 15:21:35 ET T: 12/07/2012 19:06:18 ET JOB#: [id number]  cc: Consuela Mimes, MD, <Dictator> Consuela Mimes MD ELECTRONICALLY SIGNED 12/15/2012 20:44

## 2015-02-04 NOTE — H&P (Signed)
Subjective/Chief Complaint Left groin swelling, drainage, pain   History of Present Illness Brittany Cummings is a pleasant 49 yo diabetic female who presents with 3 weeks of left groin pain and swelling.  She says that it began suddenly approx 3 weeks ago.  She felt a swelling which increased in size and began draining 1 week ago.  She has a history of approx 2-3 abscesses per year but usually they drain and resolve on their own.  She did take some keflex and sulfa drug that she had left over and it has not improved.  Has noticed a hardening area superiorly to this which has became increasingly more painful.  Her blood sugars, which when well controlled are around 200, have been approx 3-400 recently.  Has an ultrasound which shows a superior 3 x 3 cm fluid collection connecting to opening.  Is scheduled to get gastric bypass in March.  Last ate at 4 pm, last PO liquids at 9 pm.   Past History DM HTN Arthritis Glaucoma Migraines Depression H/o total knee replacement s/p appendectomy H/o back surgery H/o breast reduction   Past Medical Health Hypertension, Diabetes Mellitus, Smoking   Past Med/Surgical Hx:  Glaucoma:   Arthritis:   diabetes:   Migraines:   Hypertension:   Depression:   left total knee replacement:   left knee:   Cataract Extraction:   appendectomy:   breast reduction:   back surg:   ALLERGIES:  Demerol: N/V/Diarrhea  Family and Social History:  Family History Coronary Artery Disease  Hypertension  Diabetes Mellitus  Cancer  Stroke  Mother - Breast, Father - colon   Social History positive  tobacco, negative ETOH, negative Illicit drugs, 1 ppd, previously 1.5-2 ppd   + Tobacco Current (within 1 year)   Place of [place], Here with husband   Review of Systems:  Subjective/Chief Complaint Pain, drainage, swelling left groin   Fever/Chills No   Cough No   Sputum No   Abdominal Pain No   Diarrhea No   Constipation No   Nausea/Vomiting  No   SOB/DOE No   Chest Pain No   Medications/Allergies Reviewed Medications/Allergies reviewed   Physical Exam:  GEN well developed, no acute distress, obese   HEENT pink conjunctivae, PERRL, hearing intact to voice, moist oral mucosa   NECK supple  No masses   RESP normal resp effort  clear BS  no use of accessory muscles   CARD regular rate  no murmur  no thrills  No LE edema  no JVD  no Rub   ABD denies tenderness  denies Flank Tenderness  no hernia  soft   GU Left groin with punctate opening with purulent drainage, indurated, approx 3 x 3 cm area of induration left lower abdomen   EXTR negative cyanosis/clubbing, negative edema   SKIN No rashes, No ulcers   NEURO negative rigidity, negative tremor   PSYCH alert, A+O to time, place, person, good insight    Assessment/Admission Diagnosis Brittany Cummings is a pleasant 49 yo F with a history of DM and recurrent groin abscesses.  Presents with left groin abscess, likely inadequately drained and with hyperglycemia.   Plan Will plan on operative I and D of left groin abscess.  Will have to lower BG prior to operative management.   Electronic Signatures: Floyde Parkins (MD)  (Signed 23-Feb-14 03:55)  Authored: CHIEF COMPLAINT and HISTORY, PAST MEDICAL/SURGIAL HISTORY, ALLERGIES, FAMILY AND SOCIAL HISTORY, REVIEW OF SYSTEMS, PHYSICAL EXAM, ASSESSMENT  AND PLAN   Last Updated: 23-Feb-14 03:55 by Floyde Parkins (MD)

## 2015-02-05 NOTE — Op Note (Signed)
PATIENT NAME:  Brittany Cummings, Brittany Cummings MR#:  [id number] DATE OF BIRTH:  1966/09/06  DATE OF PROCEDURE:  06/10/2014  PREOPERATIVE DIAGNOSIS: Chronic cholecystitis, cholelithiasis.   POSTOPERATIVE DIAGNOSIS: Chronic cholecystitis, cholelithiasis.   PROCEDURE: Laparoscopic cholecystectomy.   SURGEON: Loreli Dollar, M.D.   ANESTHESIA: General.   INDICATIONS: This 49 year old female has a history of right upper quadrant pains. CT findings of a dependent gallstone, ultrasound findings of a density within the gallbladder that did not shadow. 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 Laparoscopic cholecystectomy is recommended due to continuing right upper quadrant pains.   DESCRIPTION OF PROCEDURE: The patient was placed on the operating table in the supine position under general endotracheal anesthesia. The abdomen was prepared with ChloraPrep, draped in a sterile manner.   A short incision was made in the inferior aspect of the umbilicus and carried down to the deep fascia, which was grasped with a laryngeal hook and elevated. A Veress needle was inserted, aspirated, and irrigated with a saline solution. Next, the peritoneal cavity was inflated with carbon dioxide. The Veress needle was removed. The 10 mm cannula was inserted. The 10 mm 0 degree laparoscope was inserted to view the peritoneal cavity. Another incision was made in the epigastrium, slightly to the right of the midline, to introduce an 11 mm cannula. Two incisions were made in the lateral aspect of the right mid abdomen to introduce two 5 mm cannulas.   Initial inspection revealed the stomach with close proximity of the small bowel, consistent with her bypass. There was a fatty liver. There were a few adhesions in the right lower quadrant. Next, the gallbladder was retracted towards the right shoulder. The infundibulum  was retracted inferiorly and laterally. The gallbladder neck was mobilized with incision of the visceral peritoneum. The cystic duct was dissected free from surrounding structures. The cystic artery was dissected free from surrounding structures. A critical view of safety was demonstrated. Next, the cystic artery was controlled with double Endoclips and divided allowing additional traction on the cystic duct. It appeared that with the mass effect of the fatty liver and degree of exposure, I could not safely do a cholangiogram, and, therefore, the cystic duct was controlled with 3 Endoclips proximally and 1 distally and divided. Next, the gallbladder was dissected free from the liver with hook and cautery. Bleeding was minimal. Hemostasis was subsequently intact. The gallbladder was completely separated and was brought up through the infraumbilical incision, opened and suctioned. Stone forceps were used to remove a small stone, and with additional traction and manipulation, the gallbladder was removed and submitted in formalin for routine pathology.   Next, the right upper quadrant was further inspected. Hemostasis was intact. The cannulas were removed allowing carbon dioxide to escape from the peritoneal cavity. Next, umbilical hernia sac was dissected free from surrounding structures up through the fascial ring defect and was inverted and the fascial ring defect closed with a 0 Maxon figure-of-eight suture. All skin incisions were closed with interrupted 5-0 chromic subcuticular suture, benzoin, and Steri-Strips. Dressings were applied with paper tape.   The patient tolerated the surgery satisfactorily and was prepared for transfer to the recovery room.   ____________________________ Lenna Sciara. Rochel Brome, MD jws:JT D: 06/10/2014 11:47:35 ET T: 06/10/2014 14:18:29 ET JOB#: [id number]  cc: Loreli Dollar, MD, <Dictator> Loreli Dollar MD ELECTRONICALLY SIGNED 06/11/2014 17:28

## 2015-05-02 ENCOUNTER — Other Ambulatory Visit: Payer: Self-pay | Admitting: Internal Medicine

## 2015-05-02 DIAGNOSIS — M5116 Intervertebral disc disorders with radiculopathy, lumbar region: Secondary | ICD-10-CM

## 2015-05-05 ENCOUNTER — Ambulatory Visit
Admission: RE | Admit: 2015-05-05 | Discharge: 2015-05-05 | Disposition: A | Discharge status: Home / Self Care | Payer: Medicare Other | Source: Ambulatory Visit | Attending: Internal Medicine | Admitting: Internal Medicine

## 2015-05-05 DIAGNOSIS — M4806 Spinal stenosis, lumbar region: Secondary | ICD-10-CM | POA: Diagnosis not present

## 2015-05-05 DIAGNOSIS — M5416 Radiculopathy, lumbar region: Secondary | ICD-10-CM | POA: Insufficient documentation

## 2015-05-05 DIAGNOSIS — M5116 Intervertebral disc disorders with radiculopathy, lumbar region: Secondary | ICD-10-CM

## 2015-05-05 DIAGNOSIS — M7138 Other bursal cyst, other site: Secondary | ICD-10-CM | POA: Diagnosis not present

## 2015-05-05 DIAGNOSIS — Z9889 Other specified postprocedural states: Secondary | ICD-10-CM | POA: Diagnosis not present

## 2015-07-08 ENCOUNTER — Other Ambulatory Visit: Payer: Self-pay | Admitting: Neurosurgery

## 2015-07-08 DIAGNOSIS — M5416 Radiculopathy, lumbar region: Secondary | ICD-10-CM

## 2015-07-21 ENCOUNTER — Ambulatory Visit
Admission: RE | Admit: 2015-07-21 | Discharge: 2015-07-21 | Disposition: A | Discharge status: Home / Self Care | Payer: Medicare Other | Source: Ambulatory Visit | Attending: Neurosurgery | Admitting: Neurosurgery

## 2015-07-21 DIAGNOSIS — M5416 Radiculopathy, lumbar region: Secondary | ICD-10-CM

## 2015-07-21 MED ORDER — DIAZEPAM 5 MG PO TABS: 10.0000 mg | ORAL_TABLET | Freq: Once | ORAL | Status: DC

## 2015-07-21 MED ORDER — DIAZEPAM 5 MG PO TABS
10.0000 mg | ORAL_TABLET | Freq: Once | ORAL | Status: AC
  Administered 2015-07-21: 10 mg via ORAL

## 2015-07-21 MED ORDER — IOHEXOL 180 MG/ML  SOLN
15.0000 mL | Freq: Once | INTRAMUSCULAR | Status: DC | PRN
  Administered 2015-07-21: 15 mL via INTRATHECAL

## 2015-07-21 NOTE — Progress Notes (Signed)
Pt states she has been off Seroquel and Cymbalta for the past 2 days. Discharge instructions explained to pt.

## 2015-07-21 NOTE — Discharge Instructions (Signed)
Myelogram Discharge Instructions  1. Go home and rest quietly for the next 24 hours.  It is important to lie flat for the next 24 hours.  Get up only to go to the restroom.  You may lie in the bed or on a couch on your back, your stomach, your left side or your right side.  You may have one pillow under your head.  You may have pillows between your knees while you are on your side or under your knees while you are on your back.  2. DO NOT drive today.  Recline the seat as far back as it will go, while still wearing your seat belt, on the way home.  3. You may get up to go to the bathroom as needed.  You may sit up for 10 minutes to eat.  You may resume your normal diet and medications unless otherwise indicated.  Drink lots of extra fluids today and tomorrow.  4. The incidence of headache, nausea, or vomiting is about 5% (one in 20 patients).  If you develop a headache, lie flat and drink plenty of fluids until the headache goes away.  Caffeinated beverages may be helpful.  If you develop severe nausea and vomiting or a headache that does not go away with flat bed rest, call [phone].  5. You may resume normal activities after your 24 hours of bed rest is over; however, do not exert yourself strongly or do any heavy lifting tomorrow. If when you get up you have a headache when standing, go back to bed and force fluids for another 24 hours.  6. Call your physician for a follow-up appointment.  The results of your myelogram will be sent directly to your physician by the following day.  7. If you have any questions or if complications develop after you arrive home, please call [phone].  Discharge instructions have been explained to the patient.  The patient, or the person responsible for the patient, fully understands these instructions.       May resume Seroquel and Cymbalta on Oct. 7, 2016, after 1:00 pm.

## 2015-08-10 ENCOUNTER — Other Ambulatory Visit (HOSPITAL_COMMUNITY): Payer: Self-pay | Admitting: Neurosurgery

## 2015-08-24 ENCOUNTER — Encounter (HOSPITAL_COMMUNITY): Payer: Self-pay

## 2015-08-24 ENCOUNTER — Encounter (HOSPITAL_COMMUNITY)
Admission: RE | Admit: 2015-08-24 | Discharge: 2015-08-24 | Disposition: A | Discharge status: Home / Self Care | Payer: Medicare Other | Source: Ambulatory Visit | Attending: Neurosurgery | Admitting: Neurosurgery

## 2015-08-24 DIAGNOSIS — Z01818 Encounter for other preprocedural examination: Secondary | ICD-10-CM | POA: Diagnosis not present

## 2015-08-24 DIAGNOSIS — M4310 Spondylolisthesis, site unspecified: Secondary | ICD-10-CM | POA: Insufficient documentation

## 2015-08-24 DIAGNOSIS — E119 Type 2 diabetes mellitus without complications: Secondary | ICD-10-CM | POA: Diagnosis not present

## 2015-08-24 HISTORY — DX: Type 2 diabetes mellitus without complications: E11.9

## 2015-08-24 HISTORY — DX: Spondylolisthesis, site unspecified: M43.10

## 2015-08-24 HISTORY — DX: Type 2 diabetes mellitus with diabetic neuropathy, unspecified: E11.40

## 2015-08-24 HISTORY — DX: Other seasonal allergic rhinitis: J30.2

## 2015-08-24 HISTORY — DX: Essential (primary) hypertension: I10

## 2015-08-24 HISTORY — DX: Gastric ulcer, unspecified as acute or chronic, without hemorrhage or perforation: K25.9

## 2015-08-24 HISTORY — DX: Major depressive disorder, single episode, unspecified: F32.9

## 2015-08-24 HISTORY — DX: Gastro-esophageal reflux disease without esophagitis: K21.9

## 2015-08-24 HISTORY — DX: Depression, unspecified: F32.A

## 2015-08-24 HISTORY — DX: Unspecified osteoarthritis, unspecified site: M19.90

## 2015-08-24 HISTORY — DX: Other reaction to spinal and lumbar puncture: G97.1

## 2015-08-24 LAB — BASIC METABOLIC PANEL
ANION GAP: 8 (ref 5–15)
BUN: 6 mg/dL (ref 6–20)
CALCIUM: 8.8 mg/dL — AB (ref 8.9–10.3)
CO2: 25 mmol/L (ref 22–32)
Chloride: 103 mmol/L (ref 101–111)
Creatinine, Ser: 0.62 mg/dL (ref 0.44–1.00)
GFR calc Af Amer: >60 mL/min (ref >60)
GLUCOSE: 149 mg/dL — AB (ref 65–99)
Potassium: 3.9 mmol/L (ref 3.5–5.1)
SODIUM: 136 mmol/L (ref 135–145)

## 2015-08-24 LAB — TYPE AND SCREEN
ABO/RH(D): O POS
ANTIBODY SCREEN: NEGATIVE

## 2015-08-24 LAB — CBC
HCT: 44.9 % (ref 36.0–46.0)
Hemoglobin: 14.8 g/dL (ref 12.0–15.0)
MCH: 29 pg (ref 26.0–34.0)
MCHC: 33 g/dL (ref 30.0–36.0)
MCV: 87.9 fL (ref 78.0–100.0)
PLATELETS: 182 10*3/uL (ref 150–400)
RBC: 5.11 MIL/uL (ref 3.87–5.11)
RDW: 13 % (ref 11.5–15.5)
WBC: 12.5 10*3/uL — AB (ref 4.0–10.5)

## 2015-08-24 LAB — SURGICAL PCR SCREEN
MRSA, PCR: NEGATIVE
STAPHYLOCOCCUS AUREUS: NEGATIVE

## 2015-08-24 LAB — ABO/RH: ABO/RH(D): O POS

## 2015-08-24 LAB — GLUCOSE, CAPILLARY: Glucose-Capillary: 169 mg/dL — ABNORMAL HIGH (ref 65–99)

## 2015-08-24 NOTE — Progress Notes (Signed)
Pt denies SOB, chest pain, and being under the care of a cardiologist. Pt stated that an echo was done 2 years ago pre-operatively, prior to gastric bypass surgery but denies having a stress and cardiac cath; records requested from Dr. Ubaldo Glassing of [facility]. Pt denies having a chest x ray within the last year.

## 2015-08-24 NOTE — Pre-Procedure Instructions (Addendum)
Brittany Cummings  08/24/2015      [id number] [place], [place] - [address] [address] Phone: [phone] Fax: [phone]    Your procedure is scheduled on Thursday, September 01, 2015.  Report to [facility] Admitting at 9:00 A.M.  Call this number if you have problems the morning of surgery:  [phone]   Remember:  Do not eat food or drink liquids after midnight Wednesday, August 31, 2015  Take these medicines the morning of surgery with A SIP OF WATER : DULoxetine (CYMBALTA), estradiol-norethindrone (MIMVEY), metoprolol succinate (TOPROL-XL), pantoprazole (PROTONIX), fluticasone (FLONASE) nasal spray, azelastine (ASTELIN) nasal spray  if needed: Oxycodone OR Hydrocodone for pain, ondansetron (ZOFRAN ODT) for nausea or vomiting,  butorphanol (STADOL) 10 MG/ML nasal spray for headache or migraine.   Stop taking Aspirin, vitamins, fish oil,  and herbal medications. Do not take any NSAIDs ie: Ibuprofen, Advil, Naproxen or any medication containing Aspirin such as diclofenac sodium (VOLTAREN) ; STOP Thursday 11/ 10/ 16 How to Manage Your Diabetes Before Surgery Why is it important to control my blood sugar before and after surgery?   Improving blood sugar levels before and after surgery helps healing and can limit problems.  A way of improving blood sugar control is eating a healthy diet by:  - Eating less sugar and carbohydrates  - Increasing activity/exercise  - Talk with your doctor about reaching your blood sugar goals  High blood sugars (greater than 180 mg/dL) can raise your risk of infections and slow down your recovery so you will need to focus on controlling your diabetes during the weeks before surgery.  Make sure that the doctor who takes care of your diabetes knows about your planned surgery including the date and location.  How do I manage my blood sugars before surgery?   Check your blood sugar at least 4  times a day, 2 days before surgery to make sure that they are not too high or low.   Check your blood sugar the morning of your surgery when you wake up and every 2  hours until you get to the Short-Stay unit.  If your blood sugar is less than 70 mg/dL, you will need to treat for low blood sugar by:  Treat a low blood sugar (less than 70 mg/dL) with 1/2 cup of clear juice (cranberry or apple), 4 glucose tablets, OR glucose gel.  Recheck blood sugar in 15 minutes after treatment (to make sure it is greater than 70 mg/dL).  If blood sugar is not greater than 70 mg/dL on re-check, call [phone] for further instructions.   Report your blood sugar to the Short-Stay nurse when you get to Short-Stay.  References:  [facility], 2007 "How to Manage your Diabetes Before and After Surgery".  What do I do about my diabetes medications?   Do not take oral diabetes medicines (pills) the morning of surgery such as glimepiride (AMARYL)   THE NIGHT BEFORE SURGERY, take 28 units units of NOVOLIN 70/30   Insulin.  DO not take any insulin such as NOVOLIN 70/30 the morning of surgery.  Do not wear jewelry, make-up or nail polish.  Do not wear lotions, powders, or perfumes.  You may not wear deodorant.  Do not shave 48 hours prior to surgery.    Do not bring valuables to the hospital.  [facility] is not responsible for any belongings or valuables.  Contacts, dentures  or bridgework may not be worn into surgery.  Leave your suitcase in the car.  After surgery it may be brought to your room.  For patients admitted to the hospital, discharge time will be determined by your treatment team.  Patients discharged the day of surgery will not be allowed to drive home.   Name and phone number of your driver:    Special instructions:SHOWER THE NIGHT BEFORE SURGERY AND THE MORNING OF WITH CHG.  Please read over the following fact sheets that you were given. Pain Booklet, Coughing  and Deep Breathing, Blood Transfusion Information, MRSA Information and Surgical Site Infection Prevention

## 2015-08-25 LAB — HEMOGLOBIN A1C
Hgb A1c MFr Bld: 7.1 % — ABNORMAL HIGH (ref 4.8–5.6)
MEAN PLASMA GLUCOSE: 157 mg/dL

## 2015-08-30 ENCOUNTER — Encounter: Payer: Self-pay | Admitting: Urgent Care

## 2015-08-30 ENCOUNTER — Emergency Department
Admission: EM | Admit: 2015-08-30 | Discharge: 2015-08-30 | Disposition: A | Discharge status: Home / Self Care | Payer: Medicare Other | Attending: Emergency Medicine | Admitting: Emergency Medicine

## 2015-08-30 ENCOUNTER — Emergency Department: Payer: Medicare Other

## 2015-08-30 DIAGNOSIS — I10 Essential (primary) hypertension: Secondary | ICD-10-CM | POA: Insufficient documentation

## 2015-08-30 DIAGNOSIS — Z79899 Other long term (current) drug therapy: Secondary | ICD-10-CM | POA: Insufficient documentation

## 2015-08-30 DIAGNOSIS — F17210 Nicotine dependence, cigarettes, uncomplicated: Secondary | ICD-10-CM | POA: Diagnosis not present

## 2015-08-30 DIAGNOSIS — Y9289 Other specified places as the place of occurrence of the external cause: Secondary | ICD-10-CM | POA: Insufficient documentation

## 2015-08-30 DIAGNOSIS — S20211A Contusion of right front wall of thorax, initial encounter: Secondary | ICD-10-CM | POA: Diagnosis not present

## 2015-08-30 DIAGNOSIS — E1140 Type 2 diabetes mellitus with diabetic neuropathy, unspecified: Secondary | ICD-10-CM | POA: Insufficient documentation

## 2015-08-30 DIAGNOSIS — W01198A Fall on same level from slipping, tripping and stumbling with subsequent striking against other object, initial encounter: Secondary | ICD-10-CM | POA: Diagnosis not present

## 2015-08-30 DIAGNOSIS — S299XXA Unspecified injury of thorax, initial encounter: Secondary | ICD-10-CM | POA: Diagnosis present

## 2015-08-30 DIAGNOSIS — Z794 Long term (current) use of insulin: Secondary | ICD-10-CM | POA: Diagnosis not present

## 2015-08-30 DIAGNOSIS — Y998 Other external cause status: Secondary | ICD-10-CM | POA: Diagnosis not present

## 2015-08-30 DIAGNOSIS — Y9389 Activity, other specified: Secondary | ICD-10-CM | POA: Insufficient documentation

## 2015-08-30 DIAGNOSIS — Z791 Long term (current) use of non-steroidal anti-inflammatories (NSAID): Secondary | ICD-10-CM | POA: Diagnosis not present

## 2015-08-30 MED ORDER — OXYCODONE-ACETAMINOPHEN 5-325 MG PO TABS: 1.0000 | ORAL_TABLET | Freq: Four times a day (QID) | ORAL | Status: DC | PRN

## 2015-08-30 MED ORDER — MORPHINE SULFATE (PF) 4 MG/ML IV SOLN
4.0000 mg | Freq: Once | INTRAVENOUS | Status: AC
  Administered 2015-08-30: 4 mg via INTRAMUSCULAR
  Filled 2015-08-30: qty 1

## 2015-08-30 NOTE — ED Provider Notes (Signed)
[facility] Emergency Department Provider Note  Time seen: 7:35 AM  I have reviewed the triage vital signs and the nursing notes.   HISTORY  Chief Complaint Fall    HPI Brittany Cummings is a 49 y.o. female with a past medical history of diabetes, hypertension, presents to the emergency department after a fall last night. According to the patient she tripped in her yard on a root of an oak tree.Landing on her right side. States right lateral chest wall pain since then. Some pain with deep breathing. Denies any shortness of breath. Describes her chest pain is moderate and dull constant pain with occasional severe sharp pains. Denies any leg pain, swelling, cough/congestion, fever.     Past Medical History  Diagnosis Date  . Spondylolisthesis   . Diabetes mellitus without complication ([place])   . Hypertension   . Spinal headache     migraines  . Depression   . Seasonal allergies   . Diabetic neuropathy ([place])   . GERD (gastroesophageal reflux disease)   . Multiple gastric ulcers   . Arthritis     There are no active problems to display for this patient.   Past Surgical History  Procedure Laterality Date  . Gastric bypass    . Back surgery    . Breast reduction surgery    . Appendectomy    . Carpal tunnel release      right   . Joint replacement      left knee  . Knee arthroscopy      Left  . Cataract extraction      right  . Abdominal abscess excision    . Diagnostic laparoscopy      exploratory lap and LOA  . Cholecystectomy    . Hernia repair    . Colonoscopy w/ biopsies and polypectomy    . Wisdom tooth extraction      Current Outpatient Rx  Name  Route  Sig  Dispense  Refill  . amLODipine-olmesartan (AZOR) 10-40 MG tablet   Oral   Take 1 tablet by mouth daily.          Marland Kitchen azelastine (ASTELIN) 0.1 % nasal spray   Each Nare   Place 1 spray into both nostrils 2 (two) times daily. Use in each nostril as directed         .  butorphanol (STADOL) 10 MG/ML nasal spray   Nasal   Place 1 spray into the nose every 4 (four) hours as needed for headache or migraine.          . Calcium Carb-Cholecalciferol (CALCIUM 600/VITAMIN D3) 600-800 MG-UNIT TABS   Oral   Take 1 tablet by mouth daily.          . diclofenac sodium (VOLTAREN) 1 % GEL   Topical   Apply 4 g topically 4 (four) times daily.         . DULoxetine (CYMBALTA) 60 MG capsule   Oral   Take 60 mg by mouth daily.          Marland Kitchen estradiol-norethindrone (MIMVEY) 1-0.5 MG tablet   Oral   Take 1 tablet by mouth daily.          . fluticasone (FLONASE) 50 MCG/ACT nasal spray   Each Nare   Place 2 sprays into both nostrils daily.         Marland Kitchen gabapentin (NEURONTIN) 300 MG capsule   Oral   Take 300 mg by mouth at bedtime.          Marland Kitchen  glimepiride (AMARYL) 4 MG tablet   Oral   Take 4 mg by mouth daily with breakfast.          . HYDROcodone-acetaminophen (NORCO/VICODIN) 5-325 MG tablet   Oral   Take 1 tablet by mouth 3 (three) times daily as needed for moderate pain.         Marland Kitchen insulin NPH-regular Human (NOVOLIN 70/30) (70-30) 100 UNIT/ML injection   Subcutaneous   Inject 40 Units into the skin 2 (two) times daily.          . metoprolol succinate (TOPROL-XL) 100 MG 24 hr tablet   Oral   Take 100 mg by mouth daily.          . Multiple Vitamin (MULTI-VITAMINS) TABS   Oral   Take 1 tablet by mouth daily.          . ondansetron (ZOFRAN ODT) 4 MG disintegrating tablet   Oral   Take 4 mg by mouth every 8 (eight) hours as needed for nausea or vomiting.          . Oxycodone HCl 10 MG TABS   Oral   Take 10 mg by mouth every 4 (four) hours as needed. pain      0   . pantoprazole (PROTONIX) 40 MG tablet   Oral   Take 40 mg by mouth daily.          . QUEtiapine (SEROQUEL) 100 MG tablet   Oral   Take 100 mg by mouth at bedtime.          . sucralfate (CARAFATE) 1 G tablet   Oral   Take 1 g by mouth 4 (four) times daily -   with meals and at bedtime.           Allergies Meperidine  Family History  Problem Relation Age of Onset  . Cancer Mother   . Cancer - Colon Father   . Diabetes Other   . Stroke Other   . Hypertension Other     Social History Social History  Substance Use Topics  . Smoking status: Current Every Day Smoker -- 1.00 packs/day for 25 years    Types: Cigarettes  . Smokeless tobacco: Never Used  . Alcohol Use: No    Review of Systems Constitutional: Negative for fever. Denies loss of consciousness. Denies head injury. Cardiovascular: Positive right chest wall pain. Respiratory: Negative for shortness of breath. Gastrointestinal: Negative for abdominal pain Musculoskeletal: Negative for back pain. Negative neck pain. Skin: Negative for rash. Negative for abrasions Neurological: Negative for headache 10-point ROS otherwise negative.  ____________________________________________   PHYSICAL EXAM:  VITAL SIGNS: ED Triage Vitals  Enc Vitals Group     BP 08/30/15 0612 139/93 mmHg     Pulse Rate 08/30/15 0612 100     Resp 08/30/15 0612 18     Temp 08/30/15 0612 98.2 F (36.8 C)     Temp Source 08/30/15 0612 Oral     SpO2 08/30/15 0612 94 %     Weight 08/30/15 0612 165 lb (74.844 kg)     Height 08/30/15 0612 5\' 3"  (1.6 m)     Head Cir --      Peak Flow --      Pain Score 08/30/15 0612 10     Pain Loc --      Pain Edu? --      Excl. in [place]? --     Constitutional: Alert and oriented. Well appearing and in no distress. Eyes:  Normal exam ENT   Head: Normocephalic and atraumatic.   Mouth/Throat: Mucous membranes are moist. Cardiovascular: Normal rate, regular rhythm. No murmur Respiratory: Normal respiratory effort without tachypnea nor retractions. Breath sounds are clear and equal bilaterally. No wheezes/rales/rhonchi. Moderate right chest wall tenderness to palpation inferior to the right breast. Gastrointestinal: Soft and nontender. No distention.    Musculoskeletal: Nontender with normal range of motion in all extremities. Neurologic:  Normal speech and language. No gross focal neurologic deficits Skin:  Skin is warm, dry and intact.  Psychiatric: Mood and affect are normal. Speech and behavior are normal.   ____________________________________________    RADIOLOGY  Chest x-ray shows no acute findings.  ____________________________________________    INITIAL IMPRESSION / ASSESSMENT AND PLAN / ED COURSE  Pertinent labs & imaging results that were available during my care of the patient were reviewed by me and considered in my medical decision making (see chart for details).  No acute findings on chest x-ray. Likely chest wall contusion. We'll discharge him Percocet, and incentive spirometer, and have the patient follow up with her primary care doctor.  ____________________________________________   FINAL CLINICAL IMPRESSION(S) / ED DIAGNOSES  Right chest wall contusion   Harvest Dark, MD 08/30/15 [phone]

## 2015-08-30 NOTE — ED Notes (Signed)
Patient transported to X-ray 

## 2015-08-30 NOTE — ED Notes (Signed)
Patient presents with c/o pain to the lateral aspect of the RIGHT side of her torso s/p a fall last night. Patient reports that she tripped over a root and fell. (+) SOB and intense pain since.

## 2015-08-30 NOTE — ED Notes (Signed)
Pain to R side from fall when going across yard.  States that she was going to her mother in laws and tripped over a root in the yard.

## 2015-08-30 NOTE — Discharge Instructions (Signed)
Chest Contusion °A contusion is a deep bruise. Bruises happen when an injury causes bleeding under the skin. Signs of bruising include pain, puffiness (swelling), and discolored skin. The bruise may turn blue, purple, or yellow.  °HOME CARE °· Put ice on the injured area. °¨ Put ice in a plastic bag. °¨ Place a towel between the skin and the bag. °¨ Leave the ice on for 15-20 minutes at a time, 03-04 times a day for the first 48 hours. °· Only take medicine as told by your doctor. °· Rest. °· Take deep breaths (deep-breathing exercises) as told by your doctor. °· Stop smoking if you smoke. °· Do not lift objects over 5 pounds (2.3 kilograms) for 3 days or longer if told by your doctor. °GET HELP RIGHT AWAY IF:  °· You have more bruising or puffiness. °· You have pain that gets worse. °· You have trouble breathing. °· You are dizzy, weak, or pass out (faint). °· You have blood in your pee (urine) or poop (stool). °· You cough up or throw up (vomit) blood. °· Your puffiness or pain is not helped with medicines. °MAKE SURE YOU:  °· Understand these instructions. °· Will watch your condition. °· Will get help right away if you are not doing well or get worse. °  °This information is not intended to replace advice given to you by your health care provider. Make sure you discuss any questions you have with your health care provider. °  °Document Released: 03/19/2008 Document Revised: 06/25/2012 Document Reviewed: 03/24/2012 °Elsevier Interactive Patient Education ©2016 [employer] ° °

## 2015-09-01 ENCOUNTER — Emergency Department: Payer: Medicare Other

## 2015-09-01 ENCOUNTER — Encounter: Payer: Self-pay | Admitting: *Deleted

## 2015-09-01 ENCOUNTER — Emergency Department
Admission: EM | Admit: 2015-09-01 | Discharge: 2015-09-01 | Disposition: A | Discharge status: Home / Self Care | Payer: Medicare Other | Attending: Emergency Medicine | Admitting: Emergency Medicine

## 2015-09-01 DIAGNOSIS — E1140 Type 2 diabetes mellitus with diabetic neuropathy, unspecified: Secondary | ICD-10-CM | POA: Insufficient documentation

## 2015-09-01 DIAGNOSIS — Y9302 Activity, running: Secondary | ICD-10-CM | POA: Insufficient documentation

## 2015-09-01 DIAGNOSIS — Z794 Long term (current) use of insulin: Secondary | ICD-10-CM | POA: Insufficient documentation

## 2015-09-01 DIAGNOSIS — Y9289 Other specified places as the place of occurrence of the external cause: Secondary | ICD-10-CM | POA: Insufficient documentation

## 2015-09-01 DIAGNOSIS — Z791 Long term (current) use of non-steroidal anti-inflammatories (NSAID): Secondary | ICD-10-CM | POA: Diagnosis not present

## 2015-09-01 DIAGNOSIS — S2231XA Fracture of one rib, right side, initial encounter for closed fracture: Secondary | ICD-10-CM | POA: Diagnosis not present

## 2015-09-01 DIAGNOSIS — F17210 Nicotine dependence, cigarettes, uncomplicated: Secondary | ICD-10-CM | POA: Insufficient documentation

## 2015-09-01 DIAGNOSIS — Z79899 Other long term (current) drug therapy: Secondary | ICD-10-CM | POA: Diagnosis not present

## 2015-09-01 DIAGNOSIS — W01198A Fall on same level from slipping, tripping and stumbling with subsequent striking against other object, initial encounter: Secondary | ICD-10-CM | POA: Insufficient documentation

## 2015-09-01 DIAGNOSIS — I10 Essential (primary) hypertension: Secondary | ICD-10-CM | POA: Insufficient documentation

## 2015-09-01 DIAGNOSIS — Y998 Other external cause status: Secondary | ICD-10-CM | POA: Diagnosis not present

## 2015-09-01 DIAGNOSIS — S299XXA Unspecified injury of thorax, initial encounter: Secondary | ICD-10-CM | POA: Diagnosis present

## 2015-09-01 MED ORDER — METHOCARBAMOL 500 MG PO TABS: 500.0000 mg | ORAL_TABLET | Freq: Four times a day (QID) | ORAL | Status: DC | PRN

## 2015-09-01 NOTE — Discharge Instructions (Signed)

## 2015-09-01 NOTE — ED Provider Notes (Signed)
[facility] Emergency Department Provider Note ____________________________________________  Time seen: Approximately 7:24 AM  I have reviewed the triage vital signs and the nursing notes.   HISTORY  Chief Complaint Fall   HPI Brittany Cummings is a 49 y.o. female who presents to the emergency department for a second evaluation of right rib pain. She was running outside and tripped over a root and fell.She states that she was seen here after the fall, but the pain has worsened. She denies new injury. She is taking Percocet with some relief. Movement and lifting dishes make the pain worse.   Past Medical History  Diagnosis Date  . Spondylolisthesis   . Diabetes mellitus without complication ([place])   . Hypertension   . Spinal headache     migraines  . Depression   . Seasonal allergies   . Diabetic neuropathy ([place])   . GERD (gastroesophageal reflux disease)   . Multiple gastric ulcers   . Arthritis     There are no active problems to display for this patient.   Past Surgical History  Procedure Laterality Date  . Gastric bypass    . Back surgery    . Breast reduction surgery    . Appendectomy    . Carpal tunnel release      right   . Joint replacement      left knee  . Knee arthroscopy      Left  . Cataract extraction      right  . Abdominal abscess excision    . Diagnostic laparoscopy      exploratory lap and LOA  . Cholecystectomy    . Hernia repair    . Colonoscopy w/ biopsies and polypectomy    . Wisdom tooth extraction      Current Outpatient Rx  Name  Route  Sig  Dispense  Refill  . amLODipine-olmesartan (AZOR) 10-40 MG tablet   Oral   Take 1 tablet by mouth daily.          Marland Kitchen azelastine (ASTELIN) 0.1 % nasal spray   Each Nare   Place 1 spray into both nostrils 2 (two) times daily. Use in each nostril as directed         . butorphanol (STADOL) 10 MG/ML nasal spray   Nasal   Place 1 spray into the nose every 4 (four) hours  as needed for headache or migraine.          . Calcium Carb-Cholecalciferol (CALCIUM 600/VITAMIN D3) 600-800 MG-UNIT TABS   Oral   Take 1 tablet by mouth daily.          . diclofenac sodium (VOLTAREN) 1 % GEL   Topical   Apply 4 g topically 4 (four) times daily.         . DULoxetine (CYMBALTA) 60 MG capsule   Oral   Take 60 mg by mouth daily.          Marland Kitchen estradiol-norethindrone (MIMVEY) 1-0.5 MG tablet   Oral   Take 1 tablet by mouth daily.          . fluticasone (FLONASE) 50 MCG/ACT nasal spray   Each Nare   Place 2 sprays into both nostrils daily.         Marland Kitchen gabapentin (NEURONTIN) 300 MG capsule   Oral   Take 300 mg by mouth at bedtime.          Marland Kitchen glimepiride (AMARYL) 4 MG tablet   Oral   Take 4 mg  by mouth daily with breakfast.          . HYDROcodone-acetaminophen (NORCO/VICODIN) 5-325 MG tablet   Oral   Take 1 tablet by mouth 3 (three) times daily as needed for moderate pain.         Marland Kitchen insulin NPH-regular Human (NOVOLIN 70/30) (70-30) 100 UNIT/ML injection   Subcutaneous   Inject 40 Units into the skin 2 (two) times daily.          . methocarbamol (ROBAXIN) 500 MG tablet   Oral   Take 1 tablet (500 mg total) by mouth every 6 (six) hours as needed for muscle spasms.   30 tablet   0   . metoprolol succinate (TOPROL-XL) 100 MG 24 hr tablet   Oral   Take 100 mg by mouth daily.          . Multiple Vitamin (MULTI-VITAMINS) TABS   Oral   Take 1 tablet by mouth daily.          . Oxycodone HCl 10 MG TABS   Oral   Take 10 mg by mouth every 4 (four) hours as needed (for pain).       0   . oxyCODONE-acetaminophen (ROXICET) 5-325 MG tablet   Oral   Take 1 tablet by mouth every 6 (six) hours as needed.   12 tablet   0   . pantoprazole (PROTONIX) 40 MG tablet   Oral   Take 40 mg by mouth daily.          . QUEtiapine (SEROQUEL) 100 MG tablet   Oral   Take 100 mg by mouth at bedtime.          . sucralfate (CARAFATE) 1 G tablet    Oral   Take 1 g by mouth 4 (four) times daily -  with meals and at bedtime.           Allergies Meperidine  Family History  Problem Relation Age of Onset  . Cancer Mother   . Cancer - Colon Father   . Diabetes Other   . Stroke Other   . Hypertension Other     Social History Social History  Substance Use Topics  . Smoking status: Current Every Day Smoker -- 1.00 packs/day for 25 years    Types: Cigarettes  . Smokeless tobacco: Never Used  . Alcohol Use: No    Review of Systems Constitutional: No recent illness. Eyes: No visual changes. ENT: No sore throat. Cardiovascular: Denies chest pain or palpitations. Respiratory: Denies shortness of breath. Gastrointestinal: No abdominal pain.  Genitourinary: Negative for dysuria. Musculoskeletal: Pain in right rib area. Skin: Negative for rash. Neurological: Negative for headaches, focal weakness or numbness. 10-point ROS otherwise negative.  ____________________________________________   PHYSICAL EXAM:  VITAL SIGNS: ED Triage Vitals  Enc Vitals Group     BP 09/01/15 0714 135/79 mmHg     Pulse Rate 09/01/15 0714 98     Resp 09/01/15 0714 20     Temp 09/01/15 0714 97.8 F (36.6 C)     Temp Source 09/01/15 0714 Oral     SpO2 09/01/15 0714 97 %     Weight 09/01/15 0714 165 lb (74.844 kg)     Height 09/01/15 0714 5\' 3"  (1.6 m)     Head Cir --      Peak Flow --      Pain Score 09/01/15 0715 10     Pain Loc --      Pain Edu? --  Excl. in [place]? --     Constitutional: Alert and oriented. Well appearing and in no acute distress. Eyes: Conjunctivae are normal. EOMI. Head: Atraumatic. Nose: No congestion/rhinnorhea. Neck: No stridor.  Respiratory: Normal respiratory effort.  Scattered wheezes throughout noted to auscultation Musculoskeletal: Tenderness to palpation over the right anterior ribs under the right breast. No external sign of trauma. No flail chest. Neurologic:  Normal speech and language. No gross  focal neurologic deficits are appreciated. Speech is normal. No gait instability. Skin:  Skin is warm, dry and intact. Atraumatic. Psychiatric: Mood and affect are normal. Speech and behavior are normal.  ____________________________________________   LABS (all labs ordered are listed, but only abnormal results are displayed)  Labs Reviewed - No data to display ____________________________________________  RADIOLOGY  Questionable nondisplaced fracture of the right seventh rib. ____________________________________________   PROCEDURES  Procedure(s) performed: None   ____________________________________________   INITIAL IMPRESSION / ASSESSMENT AND PLAN / ED COURSE  Pertinent labs & imaging results that were available during my care of the patient were reviewed by me and considered in my medical decision making (see chart for details).  Patient was advised to follow-up with her primary care provider or orthopedics. She was advised that she may continue the Percocet for pain. She was given a prescription for Robaxin today. ER return precautions were discussed. ____________________________________________   FINAL CLINICAL IMPRESSION(S) / ED DIAGNOSES  Final diagnoses:  Right rib fracture, closed, initial encounter       Victorino Dike, FNP 09/01/15 [id number]  Ahmed Prima, MD 09/01/15 [phone]

## 2015-09-01 NOTE — ED Notes (Addendum)
Still has pain under right breast, hx fall, seen here this week for same, tender to touch

## 2015-09-14 ENCOUNTER — Encounter (HOSPITAL_COMMUNITY): Payer: Self-pay | Admitting: *Deleted

## 2015-09-14 MED ORDER — CEFAZOLIN SODIUM-DEXTROSE 2-3 GM-% IV SOLR
2.0000 g | INTRAVENOUS | Status: AC
  Administered 2015-09-15: 2 g via INTRAVENOUS
  Filled 2015-09-14: qty 50

## 2015-09-14 MED ORDER — DEXAMETHASONE SODIUM PHOSPHATE 10 MG/ML IJ SOLN
10.0000 mg | INTRAMUSCULAR | Status: AC
  Administered 2015-09-15: 10 mg via INTRAVENOUS
  Filled 2015-09-14: qty 1

## 2015-09-14 NOTE — Progress Notes (Signed)
Pt had pre-op instructions from PAT; pt verbalized understanding of all pre-op instructions.

## 2015-09-15 ENCOUNTER — Encounter (HOSPITAL_COMMUNITY): Payer: Self-pay | Admitting: Certified Registered"

## 2015-09-15 ENCOUNTER — Inpatient Hospital Stay (HOSPITAL_COMMUNITY): Payer: Medicare Other | Admitting: Certified Registered"

## 2015-09-15 ENCOUNTER — Encounter (HOSPITAL_COMMUNITY)
Admission: RE | Disposition: A | Discharge status: Home / Self Care | Payer: Self-pay | Source: Ambulatory Visit | Attending: Neurosurgery

## 2015-09-15 ENCOUNTER — Inpatient Hospital Stay (HOSPITAL_COMMUNITY): Payer: Medicare Other

## 2015-09-15 ENCOUNTER — Inpatient Hospital Stay (HOSPITAL_COMMUNITY)
Admission: RE | Admit: 2015-09-15 | Discharge: 2015-09-16 | DRG: 460 | Disposition: A | Discharge status: Home / Self Care | Payer: Medicare Other | Source: Ambulatory Visit | Attending: Neurosurgery | Admitting: Neurosurgery

## 2015-09-15 DIAGNOSIS — F17210 Nicotine dependence, cigarettes, uncomplicated: Secondary | ICD-10-CM | POA: Diagnosis present

## 2015-09-15 DIAGNOSIS — M4316 Spondylolisthesis, lumbar region: Secondary | ICD-10-CM | POA: Diagnosis present

## 2015-09-15 DIAGNOSIS — M79605 Pain in left leg: Secondary | ICD-10-CM | POA: Diagnosis present

## 2015-09-15 DIAGNOSIS — Z7951 Long term (current) use of inhaled steroids: Secondary | ICD-10-CM

## 2015-09-15 DIAGNOSIS — Z9884 Bariatric surgery status: Secondary | ICD-10-CM | POA: Diagnosis not present

## 2015-09-15 DIAGNOSIS — M4806 Spinal stenosis, lumbar region: Secondary | ICD-10-CM | POA: Diagnosis present

## 2015-09-15 DIAGNOSIS — K219 Gastro-esophageal reflux disease without esophagitis: Secondary | ICD-10-CM | POA: Diagnosis present

## 2015-09-15 DIAGNOSIS — E1140 Type 2 diabetes mellitus with diabetic neuropathy, unspecified: Secondary | ICD-10-CM | POA: Diagnosis present

## 2015-09-15 DIAGNOSIS — Z96652 Presence of left artificial knee joint: Secondary | ICD-10-CM | POA: Diagnosis present

## 2015-09-15 DIAGNOSIS — Z794 Long term (current) use of insulin: Secondary | ICD-10-CM | POA: Diagnosis not present

## 2015-09-15 DIAGNOSIS — Z79899 Other long term (current) drug therapy: Secondary | ICD-10-CM | POA: Diagnosis not present

## 2015-09-15 DIAGNOSIS — Z419 Encounter for procedure for purposes other than remedying health state, unspecified: Secondary | ICD-10-CM

## 2015-09-15 DIAGNOSIS — F329 Major depressive disorder, single episode, unspecified: Secondary | ICD-10-CM | POA: Diagnosis present

## 2015-09-15 DIAGNOSIS — I10 Essential (primary) hypertension: Secondary | ICD-10-CM | POA: Diagnosis present

## 2015-09-15 HISTORY — PX: SPINAL FUSION: SHX223

## 2015-09-15 HISTORY — DX: Spondylolisthesis, lumbar region: M43.16

## 2015-09-15 HISTORY — DX: Fracture of one rib, unspecified side, initial encounter for closed fracture: S22.39XA

## 2015-09-15 LAB — CBC
HEMATOCRIT: 46 % (ref 36.0–46.0)
HEMOGLOBIN: 15.4 g/dL — AB (ref 12.0–15.0)
MCH: 28.9 pg (ref 26.0–34.0)
MCHC: 33.5 g/dL (ref 30.0–36.0)
MCV: 86.5 fL (ref 78.0–100.0)
Platelets: 210 10*3/uL (ref 150–400)
RBC: 5.32 MIL/uL — ABNORMAL HIGH (ref 3.87–5.11)
RDW: 12.9 % (ref 11.5–15.5)
WBC: 11 10*3/uL — ABNORMAL HIGH (ref 4.0–10.5)

## 2015-09-15 LAB — GLUCOSE, CAPILLARY
GLUCOSE-CAPILLARY: 103 mg/dL — AB (ref 65–99)
Glucose-Capillary: 172 mg/dL — ABNORMAL HIGH (ref 65–99)
Glucose-Capillary: 242 mg/dL — ABNORMAL HIGH (ref 65–99)
Glucose-Capillary: 185 mg/dL — ABNORMAL HIGH (ref 65–99)

## 2015-09-15 LAB — TYPE AND SCREEN
ABO/RH(D): O POS
ANTIBODY SCREEN: NEGATIVE

## 2015-09-15 LAB — BASIC METABOLIC PANEL
Anion gap: 11 (ref 5–15)
BUN: 14 mg/dL (ref 6–20)
CALCIUM: 8.8 mg/dL — AB (ref 8.9–10.3)
CHLORIDE: 108 mmol/L (ref 101–111)
CO2: 19 mmol/L — AB (ref 22–32)
CREATININE: 0.58 mg/dL (ref 0.44–1.00)
GFR calc non Af Amer: >60 mL/min (ref >60)
GLUCOSE: 107 mg/dL — AB (ref 65–99)
Potassium: 4.2 mmol/L (ref 3.5–5.1)
Sodium: 138 mmol/L (ref 135–145)

## 2015-09-15 SURGERY — POSTERIOR LUMBAR FUSION 1 LEVEL
Anesthesia: General | Site: Spine Lumbar

## 2015-09-15 MED ORDER — AMLODIPINE BESYLATE 10 MG PO TABS
10.0000 mg | ORAL_TABLET | Freq: Every day | ORAL | Status: DC
  Filled 2015-09-15: qty 1

## 2015-09-15 MED ORDER — ESTRADIOL-NORETHINDRONE ACET 1-0.5 MG PO TABS: 1.0000 | ORAL_TABLET | Freq: Every day | ORAL | Status: DC

## 2015-09-15 MED ORDER — ROCURONIUM BROMIDE 100 MG/10ML IV SOLN
INTRAVENOUS | Status: DC | PRN
  Administered 2015-09-15: 50 mg via INTRAVENOUS

## 2015-09-15 MED ORDER — POTASSIUM CHLORIDE IN NACL 20-0.45 MEQ/L-% IV SOLN
INTRAVENOUS | Status: DC
  Filled 2015-09-15 (×3): qty 1000

## 2015-09-15 MED ORDER — ESMOLOL HCL 100 MG/10ML IV SOLN
INTRAVENOUS | Status: AC
  Filled 2015-09-15: qty 10

## 2015-09-15 MED ORDER — GLIMEPIRIDE 4 MG PO TABS
4.0000 mg | ORAL_TABLET | Freq: Every day | ORAL | Status: DC
  Administered 2015-09-16: 4 mg via ORAL
  Filled 2015-09-15 (×2): qty 1

## 2015-09-15 MED ORDER — ONDANSETRON HCL 4 MG/2ML IJ SOLN
INTRAMUSCULAR | Status: AC
  Filled 2015-09-15: qty 2

## 2015-09-15 MED ORDER — INSULIN ASPART 100 UNIT/ML ~~LOC~~ SOLN
0.0000 [IU] | Freq: Three times a day (TID) | SUBCUTANEOUS | Status: DC
  Administered 2015-09-15: 5 [IU] via SUBCUTANEOUS
  Administered 2015-09-16: 2 [IU] via SUBCUTANEOUS

## 2015-09-15 MED ORDER — CEFAZOLIN SODIUM-DEXTROSE 2-3 GM-% IV SOLR
2.0000 g | Freq: Three times a day (TID) | INTRAVENOUS | Status: AC
  Administered 2015-09-15 – 2015-09-16 (×2): 2 g via INTRAVENOUS
  Filled 2015-09-15 (×2): qty 50

## 2015-09-15 MED ORDER — BISACODYL 5 MG PO TBEC: 5.0000 mg | DELAYED_RELEASE_TABLET | Freq: Every day | ORAL | Status: DC | PRN

## 2015-09-15 MED ORDER — ALBUMIN HUMAN 5 % IV SOLN
INTRAVENOUS | Status: DC | PRN
  Administered 2015-09-15: 12:00:00 via INTRAVENOUS

## 2015-09-15 MED ORDER — SODIUM CHLORIDE 0.9 % IJ SOLN
3.0000 mL | Freq: Two times a day (BID) | INTRAMUSCULAR | Status: DC
  Administered 2015-09-15 (×2): 3 mL via INTRAVENOUS

## 2015-09-15 MED ORDER — 0.9 % SODIUM CHLORIDE (POUR BTL) OPTIME
TOPICAL | Status: DC | PRN
  Administered 2015-09-15: 1000 mL

## 2015-09-15 MED ORDER — FENTANYL CITRATE (PF) 250 MCG/5ML IJ SOLN
INTRAMUSCULAR | Status: AC
  Filled 2015-09-15: qty 5

## 2015-09-15 MED ORDER — PHENOL 1.4 % MT LIQD: 1.0000 | OROMUCOSAL | Status: DC | PRN

## 2015-09-15 MED ORDER — METHOCARBAMOL 500 MG PO TABS
500.0000 mg | ORAL_TABLET | Freq: Four times a day (QID) | ORAL | Status: DC | PRN
  Administered 2015-09-15 – 2015-09-16 (×3): 500 mg via ORAL
  Filled 2015-09-15 (×2): qty 1

## 2015-09-15 MED ORDER — SODIUM CHLORIDE 0.9 % IR SOLN
Status: DC | PRN
  Administered 2015-09-15: 500 mL

## 2015-09-15 MED ORDER — ACETAMINOPHEN 325 MG PO TABS: 650.0000 mg | ORAL_TABLET | ORAL | Status: DC | PRN

## 2015-09-15 MED ORDER — BUPIVACAINE HCL (PF) 0.5 % IJ SOLN
INTRAMUSCULAR | Status: DC | PRN
Start: 2015-09-15 — End: 2015-09-15
  Administered 2015-09-15: 20 mL

## 2015-09-15 MED ORDER — QUETIAPINE FUMARATE 100 MG PO TABS
100.0000 mg | ORAL_TABLET | Freq: Every day | ORAL | Status: DC
  Administered 2015-09-15: 100 mg via ORAL
  Filled 2015-09-15 (×2): qty 1

## 2015-09-15 MED ORDER — IRBESARTAN 300 MG PO TABS
300.0000 mg | ORAL_TABLET | Freq: Every day | ORAL | Status: DC
  Filled 2015-09-15: qty 1

## 2015-09-15 MED ORDER — PROPOFOL 10 MG/ML IV BOLUS
INTRAVENOUS | Status: AC
  Filled 2015-09-15: qty 20

## 2015-09-15 MED ORDER — LIDOCAINE HCL (CARDIAC) 20 MG/ML IV SOLN
INTRAVENOUS | Status: AC
  Filled 2015-09-15: qty 5

## 2015-09-15 MED ORDER — METOPROLOL SUCCINATE ER 25 MG PO TB24
100.0000 mg | ORAL_TABLET | Freq: Every day | ORAL | Status: DC
  Administered 2015-09-16: 100 mg via ORAL
  Filled 2015-09-15: qty 4

## 2015-09-15 MED ORDER — ARTIFICIAL TEARS OP OINT
TOPICAL_OINTMENT | OPHTHALMIC | Status: AC
  Filled 2015-09-15: qty 3.5

## 2015-09-15 MED ORDER — HYDROMORPHONE HCL 1 MG/ML IJ SOLN
0.2500 mg | INTRAMUSCULAR | Status: DC | PRN
  Administered 2015-09-15 (×3): 0.5 mg via INTRAVENOUS

## 2015-09-15 MED ORDER — FLUTICASONE PROPIONATE 50 MCG/ACT NA SUSP
2.0000 | Freq: Every day | NASAL | Status: DC
  Filled 2015-09-15: qty 16

## 2015-09-15 MED ORDER — HYDROMORPHONE HCL 1 MG/ML IJ SOLN
INTRAMUSCULAR | Status: AC
  Filled 2015-09-15: qty 1

## 2015-09-15 MED ORDER — ONDANSETRON HCL 4 MG/2ML IJ SOLN: 4.0000 mg | INTRAMUSCULAR | Status: DC | PRN

## 2015-09-15 MED ORDER — AMLODIPINE-OLMESARTAN 10-40 MG PO TABS: 1.0000 | ORAL_TABLET | Freq: Every day | ORAL | Status: DC

## 2015-09-15 MED ORDER — VANCOMYCIN HCL 1000 MG IV SOLR
INTRAVENOUS | Status: DC | PRN
  Administered 2015-09-15: 1000 mg via TOPICAL

## 2015-09-15 MED ORDER — SUCRALFATE 1 G PO TABS
1.0000 g | ORAL_TABLET | Freq: Three times a day (TID) | ORAL | Status: DC
  Administered 2015-09-15 – 2015-09-16 (×2): 1 g via ORAL
  Filled 2015-09-15 (×7): qty 1

## 2015-09-15 MED ORDER — HYDROMORPHONE HCL 1 MG/ML IJ SOLN
1.0000 mg | INTRAMUSCULAR | Status: DC | PRN
  Administered 2015-09-15: 1 mg via INTRAMUSCULAR
  Administered 2015-09-16: 1.5 mg via INTRAMUSCULAR
  Filled 2015-09-15: qty 1
  Filled 2015-09-15: qty 2

## 2015-09-15 MED ORDER — INSULIN ASPART 100 UNIT/ML ~~LOC~~ SOLN
4.0000 [IU] | Freq: Three times a day (TID) | SUBCUTANEOUS | Status: DC
  Administered 2015-09-15 – 2015-09-16 (×2): 4 [IU] via SUBCUTANEOUS

## 2015-09-15 MED ORDER — PROPOFOL 10 MG/ML IV BOLUS
INTRAVENOUS | Status: DC | PRN
  Administered 2015-09-15: 170 mg via INTRAVENOUS
  Administered 2015-09-15: 30 mg via INTRAVENOUS

## 2015-09-15 MED ORDER — GLYCOPYRROLATE 0.2 MG/ML IJ SOLN
INTRAMUSCULAR | Status: DC | PRN
  Administered 2015-09-15: 0.4 mg via INTRAVENOUS

## 2015-09-15 MED ORDER — METHOCARBAMOL 500 MG PO TABS
ORAL_TABLET | ORAL | Status: AC
  Filled 2015-09-15: qty 1

## 2015-09-15 MED ORDER — NEOSTIGMINE METHYLSULFATE 10 MG/10ML IV SOLN
INTRAVENOUS | Status: AC
  Filled 2015-09-15: qty 1

## 2015-09-15 MED ORDER — NEOSTIGMINE METHYLSULFATE 10 MG/10ML IV SOLN
INTRAVENOUS | Status: DC | PRN
  Administered 2015-09-15: 3 mg via INTRAVENOUS

## 2015-09-15 MED ORDER — LACTATED RINGERS IV SOLN
INTRAVENOUS | Status: DC
  Administered 2015-09-15 (×3): via INTRAVENOUS

## 2015-09-15 MED ORDER — MENTHOL 3 MG MT LOZG: 1.0000 | LOZENGE | OROMUCOSAL | Status: DC | PRN

## 2015-09-15 MED ORDER — SODIUM CHLORIDE 0.9 % IJ SOLN: 3.0000 mL | INTRAMUSCULAR | Status: DC | PRN

## 2015-09-15 MED ORDER — ARTIFICIAL TEARS OP OINT
TOPICAL_OINTMENT | OPHTHALMIC | Status: DC | PRN
Start: 2015-09-15 — End: 2015-09-15
  Administered 2015-09-15: 1 via OPHTHALMIC

## 2015-09-15 MED ORDER — MIDAZOLAM HCL 5 MG/5ML IJ SOLN
INTRAMUSCULAR | Status: DC | PRN
  Administered 2015-09-15: 2 mg via INTRAVENOUS

## 2015-09-15 MED ORDER — THROMBIN 20000 UNITS EX SOLR
CUTANEOUS | Status: DC | PRN
  Administered 2015-09-15: 20 mL via TOPICAL

## 2015-09-15 MED ORDER — DULOXETINE HCL 30 MG PO CPEP
60.0000 mg | ORAL_CAPSULE | Freq: Every day | ORAL | Status: DC
  Administered 2015-09-15 – 2015-09-16 (×2): 60 mg via ORAL
  Filled 2015-09-15 (×2): qty 2

## 2015-09-15 MED ORDER — MIDAZOLAM HCL 2 MG/2ML IJ SOLN
INTRAMUSCULAR | Status: AC
  Filled 2015-09-15: qty 2

## 2015-09-15 MED ORDER — DOCUSATE SODIUM 100 MG PO CAPS
100.0000 mg | ORAL_CAPSULE | Freq: Two times a day (BID) | ORAL | Status: DC
  Administered 2015-09-15 – 2015-09-16 (×2): 100 mg via ORAL
  Filled 2015-09-15 (×2): qty 1

## 2015-09-15 MED ORDER — PROMETHAZINE HCL 25 MG/ML IJ SOLN: 6.2500 mg | INTRAMUSCULAR | Status: DC | PRN

## 2015-09-15 MED ORDER — INSULIN ASPART 100 UNIT/ML ~~LOC~~ SOLN: 0.0000 [IU] | Freq: Every day | SUBCUTANEOUS | Status: DC

## 2015-09-15 MED ORDER — ONDANSETRON HCL 4 MG/2ML IJ SOLN
INTRAMUSCULAR | Status: DC | PRN
  Administered 2015-09-15: 4 mg via INTRAVENOUS

## 2015-09-15 MED ORDER — VANCOMYCIN HCL 1000 MG IV SOLR
INTRAVENOUS | Status: AC
  Filled 2015-09-15: qty 1000

## 2015-09-15 MED ORDER — OXYCODONE-ACETAMINOPHEN 5-325 MG PO TABS
1.0000 | ORAL_TABLET | ORAL | Status: DC | PRN
  Administered 2015-09-15 (×2): 2 via ORAL
  Administered 2015-09-15: 1 via ORAL
  Administered 2015-09-16 (×2): 2 via ORAL
  Filled 2015-09-15 (×5): qty 2

## 2015-09-15 MED ORDER — GABAPENTIN 300 MG PO CAPS
300.0000 mg | ORAL_CAPSULE | Freq: Every day | ORAL | Status: DC
  Administered 2015-09-15: 300 mg via ORAL
  Filled 2015-09-15: qty 1

## 2015-09-15 MED ORDER — ESMOLOL HCL 100 MG/10ML IV SOLN
INTRAVENOUS | Status: DC | PRN
  Administered 2015-09-15 (×2): 20 mg via INTRAVENOUS

## 2015-09-15 MED ORDER — GLYCOPYRROLATE 0.2 MG/ML IJ SOLN
INTRAMUSCULAR | Status: AC
  Filled 2015-09-15: qty 2

## 2015-09-15 MED ORDER — ACETAMINOPHEN 650 MG RE SUPP: 650.0000 mg | RECTAL | Status: DC | PRN

## 2015-09-15 MED ORDER — AZELASTINE HCL 0.1 % NA SOLN
1.0000 | Freq: Two times a day (BID) | NASAL | Status: DC
  Administered 2015-09-15 – 2015-09-16 (×2): 1 via NASAL
  Filled 2015-09-15: qty 30

## 2015-09-15 MED ORDER — FENTANYL CITRATE (PF) 100 MCG/2ML IJ SOLN
INTRAMUSCULAR | Status: DC | PRN
  Administered 2015-09-15 (×4): 50 ug via INTRAVENOUS
  Administered 2015-09-15: 150 ug via INTRAVENOUS
  Administered 2015-09-15 (×2): 50 ug via INTRAVENOUS
  Administered 2015-09-15: 100 ug via INTRAVENOUS
  Administered 2015-09-15: 50 ug via INTRAVENOUS

## 2015-09-15 MED ORDER — LIDOCAINE HCL (CARDIAC) 20 MG/ML IV SOLN
INTRAVENOUS | Status: DC | PRN
  Administered 2015-09-15: 80 mg via INTRAVENOUS

## 2015-09-15 SURGICAL SUPPLY — 70 items
BAG DECANTER FOR FLEXI CONT (MISCELLANEOUS) ×3 IMPLANT
BENZOIN TINCTURE PRP APPL 2/3 (GAUZE/BANDAGES/DRESSINGS) ×3 IMPLANT
BLADE CLIPPER SURG (BLADE) IMPLANT
BONE EQUIVA 5CC (Bone Implant) ×3 IMPLANT
BRUSH SCRUB EZ PLAIN DRY (MISCELLANEOUS) ×3 IMPLANT
BUR CUTTER 7.0 ROUND (BURR) ×3 IMPLANT
BUR MATCHSTICK NEURO 3.0 LAGG (BURR) ×3 IMPLANT
CAGE PEEK OPTIMA ARDIS 11X9X26 (Cage) ×6 IMPLANT
CANISTER SUCT 3000ML PPV (MISCELLANEOUS) ×3 IMPLANT
CLOSURE WOUND 1/2 X4 (GAUZE/BANDAGES/DRESSINGS)
CONT SPEC 4OZ CLIKSEAL STRL BL (MISCELLANEOUS) ×3 IMPLANT
COVER BACK TABLE 60X90IN (DRAPES) ×3 IMPLANT
DRAPE C-ARM 42X72 X-RAY (DRAPES) ×6 IMPLANT
DRAPE C-ARMOR (DRAPES) ×3 IMPLANT
DRAPE LAPAROTOMY 100X72X124 (DRAPES) ×3 IMPLANT
DRAPE SURG 17X23 STRL (DRAPES) ×6 IMPLANT
DRSG OPSITE 4X5.5 SM (GAUZE/BANDAGES/DRESSINGS) ×3 IMPLANT
DRSG OPSITE POSTOP 4X6 (GAUZE/BANDAGES/DRESSINGS) ×3 IMPLANT
DURAPREP 26ML APPLICATOR (WOUND CARE) ×3 IMPLANT
ELECT REM PT RETURN 9FT ADLT (ELECTROSURGICAL) ×3
ELECTRODE REM PT RTRN 9FT ADLT (ELECTROSURGICAL) ×1 IMPLANT
EVACUATOR 1/8 PVC DRAIN (DRAIN) ×3 IMPLANT
GAUZE SPONGE 4X4 12PLY STRL (GAUZE/BANDAGES/DRESSINGS) IMPLANT
GAUZE SPONGE 4X4 16PLY XRAY LF (GAUZE/BANDAGES/DRESSINGS) ×6 IMPLANT
GLOVE BIO SURGEON STRL SZ8 (GLOVE) ×3 IMPLANT
GLOVE BIOGEL PI IND STRL 6.5 (GLOVE) ×1 IMPLANT
GLOVE BIOGEL PI IND STRL 8 (GLOVE) ×1 IMPLANT
GLOVE BIOGEL PI IND STRL 8.5 (GLOVE) ×1 IMPLANT
GLOVE BIOGEL PI INDICATOR 6.5 (GLOVE) ×2
GLOVE BIOGEL PI INDICATOR 8 (GLOVE) ×2
GLOVE BIOGEL PI INDICATOR 8.5 (GLOVE) ×2
GLOVE ECLIPSE 6.5 STRL STRAW (GLOVE) ×3 IMPLANT
GLOVE ECLIPSE 7.5 STRL STRAW (GLOVE) ×12 IMPLANT
GLOVE ECLIPSE 8.0 STRL XLNG CF (GLOVE) ×6 IMPLANT
GLOVE EXAM NITRILE LRG STRL (GLOVE) IMPLANT
GLOVE EXAM NITRILE MD LF STRL (GLOVE) IMPLANT
GLOVE EXAM NITRILE XS STR PU (GLOVE) IMPLANT
GOWN STRL REUS W/ TWL LRG LVL3 (GOWN DISPOSABLE) ×1 IMPLANT
GOWN STRL REUS W/ TWL XL LVL3 (GOWN DISPOSABLE) ×3 IMPLANT
GOWN STRL REUS W/TWL 2XL LVL3 (GOWN DISPOSABLE) ×6 IMPLANT
GOWN STRL REUS W/TWL LRG LVL3 (GOWN DISPOSABLE) ×2
GOWN STRL REUS W/TWL XL LVL3 (GOWN DISPOSABLE) ×6
HANDLE PEDIGUARD CANNULATED (INSTRUMENTS) ×3 IMPLANT
K-WIRE NITHNOL TROCAR TIP (WIRE) ×12 IMPLANT
KIT BASIN OR (CUSTOM PROCEDURE TRAY) ×3 IMPLANT
KIT ROOM TURNOVER OR (KITS) ×3 IMPLANT
LIQUID BAND (GAUZE/BANDAGES/DRESSINGS) ×3 IMPLANT
NEEDLE 1 PEDIGUARD CANNULATED (NEEDLE) ×6 IMPLANT
NEEDLE HYPO 22GX1.5 SAFETY (NEEDLE) ×3 IMPLANT
NS IRRIG 1000ML POUR BTL (IV SOLUTION) ×3 IMPLANT
PACK LAMINECTOMY NEURO (CUSTOM PROCEDURE TRAY) ×3 IMPLANT
PAD ARMBOARD 7.5X6 YLW CONV (MISCELLANEOUS) ×18 IMPLANT
PATTIES SURGICAL .75X.75 (GAUZE/BANDAGES/DRESSINGS) IMPLANT
ROD BENT PERC 35MM (Rod) ×6 IMPLANT
SCREW POLYAXIA MIS 6.5X40MM (Screw) ×12 IMPLANT
SHEATH PAT (SHEATH) ×3 IMPLANT
SPONGE LAP 4X18 X RAY DECT (DISPOSABLE) IMPLANT
SPONGE SURGIFOAM ABS GEL 100 (HEMOSTASIS) ×3 IMPLANT
STRIP CLOSURE SKIN 1/2X4 (GAUZE/BANDAGES/DRESSINGS) IMPLANT
SUT PROLENE 0 CT 1 30 (SUTURE) ×3 IMPLANT
SUT VIC AB 0 CT1 18XCR BRD8 (SUTURE) ×2 IMPLANT
SUT VIC AB 0 CT1 8-18 (SUTURE) ×4
SUT VIC AB 2-0 OS6 18 (SUTURE) ×9 IMPLANT
SUT VIC AB 3-0 CP2 18 (SUTURE) ×3 IMPLANT
TOP CLSR SEQUOIA (Orthopedic Implant) ×12 IMPLANT
TOWEL OR 17X24 6PK STRL BLUE (TOWEL DISPOSABLE) ×3 IMPLANT
TOWEL OR 17X26 10 PK STRL BLUE (TOWEL DISPOSABLE) ×3 IMPLANT
TRAP SPECIMEN MUCOUS 40CC (MISCELLANEOUS) ×3 IMPLANT
TRAY FOLEY W/METER SILVER 14FR (SET/KITS/TRAYS/PACK) ×3 IMPLANT
WATER STERILE IRR 1000ML POUR (IV SOLUTION) ×3 IMPLANT

## 2015-09-15 NOTE — Progress Notes (Signed)
Orthopedic Tech Progress Note Patient Details:  Brittany Cummings [birth year] [name] Patient has brace. Patient ID: Brittany Cummings, female   DOB: [birth year], 49 y.o.   MRN: [name]   Braulio Bosch 09/15/2015, 4:03 PM

## 2015-09-15 NOTE — Transfer of Care (Signed)
Immediate Anesthesia Transfer of Care Note  Patient: Brittany Cummings  Procedure(s) Performed: Procedure(s): POSTERIOR LUMBAR INTERBODY FUSION LUMBAR FOUR-FIVE WITH PATHFINDER SCREWS (N/A)  Patient Location: PACU  Anesthesia Type:General  Level of Consciousness: awake, alert  and patient cooperative  Airway & Oxygen Therapy: Patient Spontanous Breathing and Patient connected to face mask oxygen  Post-op Assessment: Report given to RN, Post -op Vital signs reviewed and stable and Patient moving all extremities X 4  Post vital signs: Reviewed and stable  Last Vitals:  Filed Vitals:   09/15/15 0844  BP: 138/80  Pulse: 90  Temp: 36.8 C  Resp: 20    Complications: No apparent anesthesia complications

## 2015-09-15 NOTE — Anesthesia Preprocedure Evaluation (Signed)
Anesthesia Evaluation  Patient identified by MRN, date of birth, ID band Patient awake    Reviewed: Allergy & Precautions, NPO status , Patient's Chart, lab work & pertinent test results  History of Anesthesia Complications (+) POST - OP SPINAL HEADACHE  Airway Mallampati: II  TM Distance: >3 FB Neck ROM: Full    Dental  (+) Poor Dentition   Pulmonary Current Smoker,    breath sounds clear to auscultation       Cardiovascular hypertension,  Rhythm:Regular Rate:Normal     Neuro/Psych  Headaches,    GI/Hepatic PUD, GERD  ,Hx of gastric bypass surgery   Endo/Other  diabetes  Renal/GU      Musculoskeletal  (+) Arthritis ,   Abdominal   Peds  Hematology   Anesthesia Other Findings   Reproductive/Obstetrics                             Anesthesia Physical Anesthesia Plan  ASA: III  Anesthesia Plan: General   Post-op Pain Management:    Induction:   Airway Management Planned: Oral ETT  Additional Equipment:   Intra-op Plan:   Post-operative Plan: Extubation in OR  Informed Consent: I have reviewed the patients History and Physical, chart, labs and discussed the procedure including the risks, benefits and alternatives for the proposed anesthesia with the patient or authorized representative who has indicated his/her understanding and acceptance.   Dental advisory given  Plan Discussed with: CRNA and Surgeon  Anesthesia Plan Comments:         Anesthesia Quick Evaluation

## 2015-09-15 NOTE — Anesthesia Procedure Notes (Signed)
Procedure Name: Intubation Date/Time: 09/15/2015 10:11 AM Performed by: Rogers Blocker Pre-anesthesia Checklist: Patient identified, Timeout performed, Emergency Drugs available, Suction available and Patient being monitored Patient Re-evaluated:Patient Re-evaluated prior to inductionOxygen Delivery Method: Circle system utilized Preoxygenation: Pre-oxygenation with 100% oxygen Intubation Type: IV induction Ventilation: Mask ventilation without difficulty Laryngoscope Size: Mac and 3 Grade View: Grade I Tube type: Oral Tube size: 7.5 mm Number of attempts: 1 Airway Equipment and Method: Stylet Placement Confirmation: ETT inserted through vocal cords under direct vision,  breath sounds checked- equal and bilateral,  positive ETCO2 and CO2 detector Secured at: 22 cm Tube secured with: Tape Dental Injury: Teeth and Oropharynx as per pre-operative assessment

## 2015-09-15 NOTE — Op Note (Signed)
Preop diagnosis: Multiple spondylolisthesis L4-5 with lateral recess stenosis Postop diagnosis: Same Procedure: By lateral L4-5 decompressive laminectomy for relief of central and lateral recess stenosis more so than needed for interbody fusion with decompression of L4 and L5 nerve roots Bilateral L4-5 microdiscectomy L4-5 posterior lumbar interbody fusion with peek interbody spacer L4-5 posterolateral fusion L4-5 nonsegmental instrumentation with Pathfinder percutaneous pedicle screw system Surgeon: Garo Heidelberg Asst.: Vertell Limber  After being placed the prone position the patient's back was prepped and draped in usual sterile fashion. Localizing x-rays taken prior to incision to identify the appropriate level. Midline incision was made above the spinous processes of L4 and L5 incision was carried out of the dorsal lumbar fascia. The plane between the subcutaneous tissue and the dorsal lumbar fascia was dissected free. Subperiosteal dissection was then carried out on the spinous processes and lamina and facet joint at L4-5 bilaterally. Self retaining retractor was placed for exposure and x-ray showed approach the appropriate level. Using the [place] rongeur spinous processes of L4 and L5 were removed. Starting at L4-5 on the left generous laminotomy was performed by removing the inferior 80% of the L4 lamina the medial three quarters the facet joint the superior one third of the L5 lamina. Residual bone and hypertrophic ligamentum flavum removed in a piecemeal fashion. Some decompression was carried on the opposite side and then the residual midline structures were removed to complete the bilateral decompression. L4 and L5 nerve roots well visualized well decompressed more so than needed for interbody fusion. At this time bilateral microdiscectomy was carried out. Thorough disc space cleanout was carried out once same time great care was taken to avoid injury to the neural elements and this was successfully done.  This was then prepared for interbody fusion. It was sequentially distracted up to an 11 mm size and this was felt to be a good choice. After thoroughly preparing the endplates for interbody fusion we placed an 11 x 9 x 26 motor cage filled with autologous bone morselized allograft on one side. Prior to placing the second cage we placed the same mixture deep within the interspace to help with interbody fusion. We then placed a second cage without difficulty and fossae showed to be in good position. Large amount of irrigation were carried out and any bleeding control proper coagulation [place]. The dorsal lumbar fascia was closed the percutaneous pedicle screws were placed in standard fashion. Jamshidi needle was placed at L4 and L5 bilaterally through the pedicle and the vertebral body without difficulty. We then placed guidewires remove the Jamshidi needles. We then connected the open the fascia tapped with a 6 mm tap and then placed 6.5 x 40 mm screws at L4 and L5 bilaterally. We then passed the rod to the towers and secured at the top of the screws without difficulty. We did tightening and final tightening with torque and counter torque and then removed the [place] without difficulty. Final fluoroscopy AP lateral direction looked excellent. The was then irrigated and the opening the dorsal lumbar fascia was closed with 0 Vicryl. A drain was left in the space and then the subcutaneous and subcuticular tissues were closed with interrupted Vicryl. A running locking Prolene was placed on the skin. A sterile dressing was then applied and the patient was extubated and taken to recovery in stable condition.

## 2015-09-15 NOTE — Anesthesia Postprocedure Evaluation (Signed)
Anesthesia Post Note  Patient: Brittany Cummings  Procedure(s) Performed: Procedure(s) (LRB): POSTERIOR LUMBAR INTERBODY FUSION LUMBAR FOUR-FIVE WITH PATHFINDER SCREWS (N/A)  Patient location during evaluation: PACU Anesthesia Type: General Level of consciousness: awake and alert Pain management: pain level controlled Vital Signs Assessment: post-procedure vital signs reviewed and stable Respiratory status: spontaneous breathing, nonlabored ventilation, respiratory function stable and patient connected to nasal cannula oxygen Cardiovascular status: blood pressure returned to baseline and stable Postop Assessment: no signs of nausea or vomiting Anesthetic complications: no    Last Vitals:  Filed Vitals:   09/15/15 1440 09/15/15 1500  BP: 130/75   Pulse: 104   Temp:  36.1 C  Resp: 19     Last Pain:  Filed Vitals:   09/15/15 1511  PainSc: 5     LLE Motor Response: Purposeful movement, Responds to commands LLE Sensation: No numbness RLE Motor Response: Purposeful movement, Responds to commands RLE Sensation: No numbness      Laquitha Heslin,JAMES TERRILL

## 2015-09-15 NOTE — H&P (Signed)
Brittany Cummings is an 49 y.o. female.   Chief Complaint: Back and bilateral leg pain HPI: The patient is a 49 year old female who was evaluated in the office for back pain with bilateral lower extremity pain. She had left-sided pain for many years of the right-sided pain is going on for a few months is fairly severe. She actually relates this increased pain to lifting some boxes back in July. She saw physician recommended a fusion and she came to me for evaluation. After evaluation the office the patient underwent myelography with post myelographic CT. Showed a mobile spondylolisthesis at L4-5. There was lateral recess stenosis bilaterally. After discussing the options the patient requested surgery and now comes for bilateral decompression with interbody fusion and pedicle screw fixation. I've had a long discussion with her regarding the risks and benefits of surgical intervention. The risks discussed include but are not limited to bleeding infection weakness numbness paralysis spinal fluid leak coma quadriplegia nonunion coma and death. We have discussed alternative methods of therapy along with the risks and benefits of nonintervention. She's had the opportunity to ask numerous questions and appears to understand. With this information in hand she has requested we proceed with surgery.  Past Medical History  Diagnosis Date  . Spondylolisthesis   . Diabetes mellitus without complication ([place])   . Hypertension   . Spinal headache     migraines  . Depression   . Seasonal allergies   . Diabetic neuropathy ([place])   . GERD (gastroesophageal reflux disease)   . Multiple gastric ulcers   . Arthritis   . Rib fracture     right side    Past Surgical History  Procedure Laterality Date  . Gastric bypass    . Back surgery    . Breast reduction surgery    . Appendectomy    . Carpal tunnel release      right   . Joint replacement      left knee  . Knee arthroscopy      Left  . Cataract extraction       right  . Abdominal abscess excision    . Diagnostic laparoscopy      exploratory lap and LOA  . Cholecystectomy    . Hernia repair    . Colonoscopy w/ biopsies and polypectomy    . Wisdom tooth extraction      Family History  Problem Relation Age of Onset  . Cancer Mother   . Cancer - Colon Father   . Diabetes Other   . Stroke Other   . Hypertension Other    Social History:  reports that she has been smoking Cigarettes.  She has a 25 pack-year smoking history. She has never used smokeless tobacco. She reports that she does not drink alcohol or use illicit drugs.  Allergies:  Allergies  Allergen Reactions  . Meperidine Nausea And Vomiting    Medications Prior to Admission  Medication Sig Dispense Refill  . amLODipine-olmesartan (AZOR) 10-40 MG tablet Take 1 tablet by mouth daily.     Marland Kitchen azelastine (ASTELIN) 0.1 % nasal spray Place 1 spray into both nostrils 2 (two) times daily. Use in each nostril as directed    . butorphanol (STADOL) 10 MG/ML nasal spray Place 1 spray into the nose every 4 (four) hours as needed for headache or migraine.     . Calcium Carb-Cholecalciferol (CALCIUM 600/VITAMIN D3) 600-800 MG-UNIT TABS Take 1 tablet by mouth daily.     . diclofenac sodium (VOLTAREN)  1 % GEL Apply 4 g topically 4 (four) times daily.    . DULoxetine (CYMBALTA) 60 MG capsule Take 60 mg by mouth daily.     Marland Kitchen estradiol-norethindrone (MIMVEY) 1-0.5 MG tablet Take 1 tablet by mouth daily.     . fluticasone (FLONASE) 50 MCG/ACT nasal spray Place 2 sprays into both nostrils daily.    Marland Kitchen gabapentin (NEURONTIN) 300 MG capsule Take 300 mg by mouth at bedtime.     Marland Kitchen glimepiride (AMARYL) 4 MG tablet Take 4 mg by mouth daily with breakfast.     . HYDROcodone-acetaminophen (NORCO/VICODIN) 5-325 MG tablet Take 1 tablet by mouth 3 (three) times daily as needed for moderate pain.    Marland Kitchen insulin NPH-regular Human (NOVOLIN 70/30) (70-30) 100 UNIT/ML injection Inject 40 Units into the skin 2 (two)  times daily.     . methocarbamol (ROBAXIN) 500 MG tablet Take 1 tablet (500 mg total) by mouth every 6 (six) hours as needed for muscle spasms. 30 tablet 0  . metoprolol succinate (TOPROL-XL) 100 MG 24 hr tablet Take 100 mg by mouth daily.     . Multiple Vitamin (MULTI-VITAMINS) TABS Take 1 tablet by mouth daily.     . Oxycodone HCl 10 MG TABS Take 10 mg by mouth every 4 (four) hours as needed (for pain).   0  . oxyCODONE-acetaminophen (ROXICET) 5-325 MG tablet Take 1 tablet by mouth every 6 (six) hours as needed. 12 tablet 0  . pantoprazole (PROTONIX) 40 MG tablet Take 40 mg by mouth daily.     . QUEtiapine (SEROQUEL) 100 MG tablet Take 100 mg by mouth at bedtime.     . sucralfate (CARAFATE) 1 G tablet Take 1 g by mouth 4 (four) times daily -  with meals and at bedtime.      Results for orders placed or performed during the hospital encounter of 09/15/15 (from the past 48 hour(s))  Glucose, capillary     Status: Abnormal   Collection Time: 09/15/15  8:47 AM  Result Value Ref Range   Glucose-Capillary 103 (H) 65 - 99 mg/dL  CBC     Status: Abnormal   Collection Time: 09/15/15  8:54 AM  Result Value Ref Range   WBC 11.0 (H) 4.0 - 10.5 K/uL   RBC 5.32 (H) 3.87 - 5.11 MIL/uL   Hemoglobin 15.4 (H) 12.0 - 15.0 g/dL   HCT 46.0 36.0 - 46.0 %   MCV 86.5 78.0 - 100.0 fL   MCH 28.9 26.0 - 34.0 pg   MCHC 33.5 30.0 - 36.0 g/dL   RDW 12.9 11.5 - 15.5 %   Platelets 210 150 - 400 K/uL  Basic metabolic panel     Status: Abnormal   Collection Time: 09/15/15  8:54 AM  Result Value Ref Range   Sodium 138 135 - 145 mmol/L   Potassium 4.2 3.5 - 5.1 mmol/L   Chloride 108 101 - 111 mmol/L   CO2 19 (L) 22 - 32 mmol/L   Glucose, Bld 107 (H) 65 - 99 mg/dL   BUN 14 6 - 20 mg/dL   Creatinine, Ser 0.58 0.44 - 1.00 mg/dL   Calcium 8.8 (L) 8.9 - 10.3 mg/dL   GFR calc non Af Amer >60 >60 mL/min   GFR calc Af Amer >60 >60 mL/min    Comment: (NOTE) The eGFR has been calculated using the CKD EPI  equation. This calculation has not been validated in all clinical situations. eGFR's persistently <60 mL/min signify possible Chronic Kidney  Disease.    Anion gap 11 5 - 15   No results found.  Positive for balance disturbance chest pain high blood pressure indigestion nausea and vomiting gastritis abdominal pain  Blood pressure 138/80, pulse 90, temperature 98.2 F (36.8 C), temperature source Oral, resp. rate 20, height 5' 3"  (1.6 m), weight 74.844 kg (165 lb), SpO2 100 %.  Patient is awake or and oriented. She is no facial asymmetry. Her gait is mildly antalgic. She has decreased knee jerk absent ankle jerk reflexes. Strength is intact. Assessment/Plan Impression is that of mobile spondylolisthesis L4-5. The plan is for an L4-5 interbody fusion with pedicle screw fixation.  Faythe Ghee, MD 09/15/2015, 9:58 AM

## 2015-09-16 LAB — GLUCOSE, CAPILLARY: Glucose-Capillary: 144 mg/dL — ABNORMAL HIGH (ref 65–99)

## 2015-09-16 MED ORDER — CYCLOBENZAPRINE HCL 10 MG PO TABS: 10.0000 mg | ORAL_TABLET | Freq: Three times a day (TID) | ORAL | Status: AC | PRN

## 2015-09-16 MED ORDER — OXYCODONE HCL 10 MG PO TABS: 5.0000 mg | ORAL_TABLET | ORAL | Status: DC | PRN

## 2015-09-16 NOTE — Progress Notes (Signed)
Pt. discharged home accompanied by husband. Prescriptions and discharge instructions given with verbalization of understanding. Incision site on back with no s/s of infection - no swelling, redness, bleeding, and/or drainage noted. Lumbar brace in place. Opportunity given to ask questions but no question asked. Pt. transported out of this unit in wheelchair by the volunteer.

## 2015-09-16 NOTE — Discharge Summary (Signed)
Physician Discharge Summary  Patient ID: KITT COURSE MRN: [name] DOB/AGE: 51-Mar-1967 51 y.o.  Admit date: 09/15/2015 Discharge date: 09/16/2015  Admission Diagnoses:  Discharge Diagnoses:  Active Problems:   Spondylolisthesis of lumbar region   Discharged Condition: good  Hospital Course: Surgery yesterday for 1 level plif; did well. No leg pain post op. Wound clean and dry. Home pod 1, specific instructions given.  Consults: None  Significant Diagnostic Studies: none  Treatments: surgery: L 45 plif  Discharge Exam: Blood pressure 127/62, pulse 113, temperature 98.9 F (37.2 C), temperature source Oral, resp. rate 20, height 5\' 3"  (1.6 m), weight 74.844 kg (165 lb), SpO2 97 %. Incision/Wound:clean and dry; no new neuro issues  Disposition: 01-Home or Self Care     Medication List    ASK your doctor about these medications        azelastine 0.1 % nasal spray  Commonly known as:  ASTELIN  Place 1 spray into both nostrils 2 (two) times daily. Use in each nostril as directed     AZOR 10-40 MG tablet  Generic drug:  amLODipine-olmesartan  Take 1 tablet by mouth daily.     butorphanol 10 MG/ML nasal spray  Commonly known as:  STADOL  Place 1 spray into the nose every 4 (four) hours as needed for headache or migraine.     CALCIUM 600/VITAMIN D3 600-800 MG-UNIT Tabs  Generic drug:  Calcium Carb-Cholecalciferol  Take 1 tablet by mouth daily.     diclofenac sodium 1 % Gel  Commonly known as:  VOLTAREN  Apply 4 g topically 4 (four) times daily.     DULoxetine 60 MG capsule  Commonly known as:  CYMBALTA  Take 60 mg by mouth daily.     fluticasone 50 MCG/ACT nasal spray  Commonly known as:  FLONASE  Place 2 sprays into both nostrils daily.     gabapentin 300 MG capsule  Commonly known as:  NEURONTIN  Take 300 mg by mouth at bedtime.     glimepiride 4 MG tablet  Commonly known as:  AMARYL  Take 4 mg by mouth daily with breakfast.     HYDROcodone-acetaminophen 5-325 MG tablet  Commonly known as:  NORCO/VICODIN  Take 1 tablet by mouth 3 (three) times daily as needed for moderate pain.     insulin NPH-regular Human (70-30) 100 UNIT/ML injection  Commonly known as:  NOVOLIN 70/30  Inject 40 Units into the skin 2 (two) times daily.     methocarbamol 500 MG tablet  Commonly known as:  ROBAXIN  Take 1 tablet (500 mg total) by mouth every 6 (six) hours as needed for muscle spasms.     metoprolol succinate 100 MG 24 hr tablet  Commonly known as:  TOPROL-XL  Take 100 mg by mouth daily.     MIMVEY 1-0.5 MG tablet  Generic drug:  estradiol-norethindrone  Take 1 tablet by mouth daily.     MULTI-VITAMINS Tabs  Take 1 tablet by mouth daily.     Oxycodone HCl 10 MG Tabs  Take 10 mg by mouth every 4 (four) hours as needed (for pain).     oxyCODONE-acetaminophen 5-325 MG tablet  Commonly known as:  ROXICET  Take 1 tablet by mouth every 6 (six) hours as needed.     pantoprazole 40 MG tablet  Commonly known as:  PROTONIX  Take 40 mg by mouth daily.     QUEtiapine 100 MG tablet  Commonly known as:  SEROQUEL  Take 100 mg  by mouth at bedtime.     sucralfate 1 G tablet  Commonly known as:  CARAFATE  Take 1 g by mouth 4 (four) times daily -  with meals and at bedtime.         At home rest most of the time. Get up 9 or 10 times each day and take a 15 or 20 minute walk. No riding in the car and to your first postoperative appointment. If you have neck surgery you may shower from the chest down starting on the third postoperative day. If you had back surgery he may start showering on the third postoperative day with saran wrap wrapped around your incisional area 3 times. After the shower remove the saran wrap. Take pain medicine as needed and other medications as instructed. Call my office for an appointment.  SignedFaythe Ghee, MD 09/16/2015, 9:17 AM

## 2015-11-04 ENCOUNTER — Other Ambulatory Visit: Payer: Self-pay | Admitting: Otolaryngology

## 2015-11-04 DIAGNOSIS — R221 Localized swelling, mass and lump, neck: Secondary | ICD-10-CM

## 2015-11-04 DIAGNOSIS — E041 Nontoxic single thyroid nodule: Secondary | ICD-10-CM

## 2015-11-14 ENCOUNTER — Ambulatory Visit
Admission: RE | Admit: 2015-11-14 | Discharge: 2015-11-14 | Disposition: A | Discharge status: Home / Self Care | Payer: Medicare Other | Source: Ambulatory Visit | Attending: Otolaryngology | Admitting: Otolaryngology

## 2015-11-14 DIAGNOSIS — R590 Localized enlarged lymph nodes: Secondary | ICD-10-CM | POA: Insufficient documentation

## 2015-11-14 DIAGNOSIS — E042 Nontoxic multinodular goiter: Secondary | ICD-10-CM | POA: Diagnosis present

## 2015-11-14 DIAGNOSIS — R221 Localized swelling, mass and lump, neck: Secondary | ICD-10-CM

## 2015-11-14 DIAGNOSIS — E041 Nontoxic single thyroid nodule: Secondary | ICD-10-CM

## 2015-11-14 LAB — POCT I-STAT CREATININE: Creatinine, Ser: 0.6 mg/dL (ref 0.44–1.00)

## 2015-11-14 MED ORDER — IOHEXOL 300 MG/ML  SOLN
75.0000 mL | Freq: Once | INTRAMUSCULAR | Status: AC | PRN
  Administered 2015-11-14: 75 mL via INTRAVENOUS

## 2015-11-16 ENCOUNTER — Ambulatory Visit: Payer: Medicare Other | Admitting: Internal Medicine

## 2015-11-22 ENCOUNTER — Encounter: Payer: Medicare Other | Attending: Internal Medicine | Admitting: Internal Medicine

## 2015-11-22 DIAGNOSIS — F329 Major depressive disorder, single episode, unspecified: Secondary | ICD-10-CM | POA: Insufficient documentation

## 2015-11-22 DIAGNOSIS — I10 Essential (primary) hypertension: Secondary | ICD-10-CM | POA: Insufficient documentation

## 2015-11-22 DIAGNOSIS — E119 Type 2 diabetes mellitus without complications: Secondary | ICD-10-CM | POA: Diagnosis not present

## 2015-11-22 DIAGNOSIS — M4316 Spondylolisthesis, lumbar region: Secondary | ICD-10-CM | POA: Diagnosis present

## 2015-11-22 DIAGNOSIS — Y839 Surgical procedure, unspecified as the cause of abnormal reaction of the patient, or of later complication, without mention of misadventure at the time of the procedure: Secondary | ICD-10-CM | POA: Insufficient documentation

## 2015-11-22 DIAGNOSIS — T8131XA Disruption of external operation (surgical) wound, not elsewhere classified, initial encounter: Secondary | ICD-10-CM | POA: Diagnosis not present

## 2015-11-22 DIAGNOSIS — Z794 Long term (current) use of insulin: Secondary | ICD-10-CM | POA: Diagnosis not present

## 2015-11-22 DIAGNOSIS — F17200 Nicotine dependence, unspecified, uncomplicated: Secondary | ICD-10-CM | POA: Diagnosis not present

## 2015-11-22 DIAGNOSIS — M1990 Unspecified osteoarthritis, unspecified site: Secondary | ICD-10-CM | POA: Insufficient documentation

## 2015-11-22 DIAGNOSIS — Z9884 Bariatric surgery status: Secondary | ICD-10-CM | POA: Diagnosis not present

## 2015-11-23 NOTE — Progress Notes (Signed)
Brittany Cummings, Brittany Cummings ([name]) Visit Report for 11/22/2015 Abuse/Suicide Risk Screen Details Patient Name: Brittany Cummings, Brittany Cummings. Date of Service: 11/22/2015 12:45 PM Medical Record Patient Account Number: [name] Number: Treating RN: Baruch Gouty, RN, BSN, Rita [birth year] [phone] y.o. Other Clinician: Date of Birth/Sex: Female) Treating ROBSON, MICHAEL Primary Care Physician/Extender: Claudette Laws Physician: Referring Physician: Karie Chimera Weeks in Treatment: 0 Abuse/Suicide Risk Screen Items Answer ABUSE/SUICIDE RISK SCREEN: Has anyone close to you tried to hurt or harm you recentlyo No Do you feel uncomfortable with anyone in your familyo No Has anyone forced you do things that you didnot want to doo No Do you have any thoughts of harming yourselfo No Patient displays signs or symptoms of abuse and/or neglect. No Electronic Signature(s) Signed: 11/22/2015 3:17:41 PM By: Regan Lemming BSN, RN Entered By: Regan Lemming on 11/22/2015 13:04:40 Brittany Cummings ([name]) -------------------------------------------------------------------------------- Activities of Daily Living Details Patient Name: Brittany Cummings, Brittany Cummings. Date of Service: 11/22/2015 12:45 PM Medical Record Patient Account Number: [name] Number: Treating RN: Baruch Gouty, RN, BSN, Rita [birth year] [phone] y.o. Other Clinician: Date of Birth/Sex: Female) Treating ROBSON, MICHAEL Primary Care Physician/Extender: Claudette Laws Physician: Referring Physician: Karie Chimera Weeks in Treatment: 0 Activities of Daily Living Items Answer Activities of Daily Living (Please select one for each item) Drive Automobile Completely Able Take Medications Completely Able Use Telephone Completely Able Care for Appearance Completely Able Use Toilet Completely Able Bath / Shower Completely Able Dress Self Completely Able Feed Self Completely Able Walk Completely Able Get In / Out Bed Completely Able Housework Completely Able Prepare  Meals Completely Able Handle Money Completely Able Shop for Self Completely Able Electronic Signature(s) Signed: 11/22/2015 3:17:41 PM By: Regan Lemming BSN, RN Entered By: Regan Lemming on 11/22/2015 13:04:27 Brittany Cummings ([name]) -------------------------------------------------------------------------------- Education Assessment Details Patient Name: Brittany Cummings. Date of Service: 11/22/2015 12:45 PM Medical Record Patient Account Number: [name] Number: Treating RN: Baruch Gouty, RN, BSN, Rita [birth year] [phone] y.o. Other Clinician: Date of Birth/Sex: Female) Treating ROBSON, MICHAEL Primary Care Physician/Extender: Claudette Laws Physician: Referring Physician: Clement Sayres in Treatment: 0 Primary Learner Assessed: Patient Learning Preferences/Education Level/Primary Language Learning Preference: Explanation Highest Education Level: College or Above Preferred Language: English Cognitive Barrier Assessment/Beliefs Language Barrier: No Physical Barrier Assessment Impaired Vision: No Impaired Hearing: No Decreased Hand dexterity: No Knowledge/Comprehension Assessment Knowledge Level: High Comprehension Level: High Ability to understand written High instructions: Ability to understand verbal High instructions: Motivation Assessment Anxiety Level: Calm Cooperation: Cooperative Education Importance: Acknowledges Need Interest in Health Problems: Asks Questions Perception: Coherent Willingness to Engage in Self- High Management Activities: Readiness to Engage in Self- High Management Activities: Electronic Signature(s) Signed: 11/22/2015 3:17:41 PM By: Regan Lemming BSN, RN Brittany Cummings, Brittany Cummings. ([name]) Entered By: Regan Lemming on 11/22/2015 [place], [place]. ([name]) -------------------------------------------------------------------------------- Fall Risk Assessment Details Patient Name: Brittany Cummings. Date of Service: 11/22/2015 12:45  PM Medical Record Patient Account Number: [name] Number: Treating RN: Baruch Gouty, RN, BSN, Rita 03-14-1966 [phone] y.o. Other Clinician: Date of Birth/Sex: Female) Treating ROBSON, MICHAEL Primary Care Physician/Extender: Claudette Laws Physician: Referring Physician: Karie Chimera Weeks in Treatment: 0 Fall Risk Assessment Items Have you had 2 or more falls in the last 12 monthso 0 Yes Have you had any fall that resulted in injury in the last 12 monthso 0 No FALL RISK ASSESSMENT: History of falling - immediate or within 3 months 0 No Secondary diagnosis 0 No Ambulatory aid None/bed rest/wheelchair/nurse 0 Yes Crutches/cane/walker 0  No Furniture 0 No IV Access/Saline Lock 0 No Gait/Training Normal/bed rest/immobile 0 Yes Weak 0 No Impaired 0 No Mental Status Oriented to own ability 0 Yes Electronic Signature(s) Signed: 11/22/2015 3:17:41 PM By: Regan Lemming BSN, RN Entered By: Regan Lemming on 11/22/2015 13:03:39 Brittany Cummings ([id number]) -------------------------------------------------------------------------------- Foot Assessment Details Patient Name: Brittany Decamp Cummings. Date of Service: 11/22/2015 12:45 PM Medical Record Patient Account Number: [id number] Number: Treating RN: Baruch Gouty, RN, BSN, Rita 02-17-1966 [phone] y.o. Other Clinician: Date of Birth/Sex: Female) Treating ROBSON, MICHAEL Primary Care Physician/Extender: Claudette Laws Physician: Referring Physician: Karie Chimera Weeks in Treatment: 0 Foot Assessment Items Site Locations + = Sensation present, - = Sensation absent, C = Callus, U = Ulcer R = Redness, W = Warmth, M = Maceration, PU = Pre-ulcerative lesion F = Fissure, S = Swelling, D = Dryness Assessment Right: Left: Other Deformity: No No Prior Foot Ulcer: No No Prior Amputation: No No Charcot Joint: No No Ambulatory Status: Ambulatory Without Help Gait: Steady Electronic Signature(s) Signed: 11/22/2015 3:17:41 PM By: Regan Lemming BSN,  RN Entered By: Regan Lemming on 11/22/2015 13:03:10 Brittany Cummings ([id number]) Brittany Cummings, Brittany EMarland Kitchen ([id number]) -------------------------------------------------------------------------------- Nutrition Risk Assessment Details Patient Name: Brittany Decamp Cummings. Date of Service: 11/22/2015 12:45 PM Medical Record Patient Account Number: [id number] Number: Treating RN: Baruch Gouty, RN, BSN, Rita 08-10-1966 [phone] y.o. Other Clinician: Date of Birth/Sex: Female) Treating ROBSON, MICHAEL Primary Care Physician/Extender: Claudette Laws Physician: Referring Physician: Karie Chimera Weeks in Treatment: 0 Height (in): Weight (lbs): Body Mass Index (BMI): Nutrition Risk Assessment Items NUTRITION RISK SCREEN: I have an illness or condition that made me change the kind and/or 0 No amount of food I eat I eat fewer than two meals per day 0 No I eat few fruits and vegetables, or milk products 0 No I have three or more drinks of beer, liquor or wine almost every day 0 No I have tooth or mouth problems that make it hard for me to eat 0 No I don't always have enough money to buy the food I need 0 No I eat alone most of the time 0 No I take three or more different prescribed or over-the-counter drugs a 0 No day Without wanting to, I have lost or gained 10 pounds in the last six 0 No months I am not always physically able to shop, cook and/or feed myself 0 No Nutrition Protocols Good Risk Protocol 0 No interventions needed Moderate Risk Protocol Electronic Signature(s) Signed: 11/22/2015 3:17:41 PM By: Regan Lemming BSN, RN Entered By: Regan Lemming on 11/22/2015 13:03:19

## 2015-11-23 NOTE — Progress Notes (Signed)
Brittany, Cummings ([name]) Visit Report for 11/22/2015 Allergy List Details Patient Name: Brittany Cummings, Brittany Cummings. Date of Service: 11/22/2015 12:45 PM Medical Record Patient Account Number: [name] Number: Treating RN: Baruch Gouty, RN, BSN, Rita Nov 20, 1965 [phone] y.o. Other Clinician: Date of Birth/Sex: Female) Treating ROBSON, MICHAEL Primary Care Physician: Emily Filbert Physician/Extender: G Referring Physician: Karie Chimera Weeks in Treatment: 0 Allergies Active Allergies Demerol Allergy Notes Electronic Signature(s) Signed: 11/22/2015 3:17:41 PM By: Regan Lemming BSN, RN Entered By: Regan Lemming on 11/22/2015 13:02:57 Brittany Cummings ([name]) -------------------------------------------------------------------------------- Arrival Information Details Patient Name: Brittany Cummings. Date of Service: 11/22/2015 12:45 PM Medical Record Patient Account Number: [name] Number: Treating RN: Baruch Gouty, RN, BSN, Rita Apr 15, 1966 [phone] y.o. Other Clinician: Date of Birth/Sex: Female) Treating ROBSON, [place] Primary Care Physician: Emily Filbert Physician/Extender: G Referring Physician: Clement Sayres in Treatment: 0 Visit Information Patient Arrived: Ambulatory Arrival Time: 13:00 Accompanied By: self Transfer Assistance: None Patient Identification Verified: Yes Secondary Verification Process Yes Completed: Patient Requires Transmission-Based No Precautions: Patient Has Alerts: No Electronic Signature(s) Signed: 11/22/2015 3:17:41 PM By: Regan Lemming BSN, RN Entered By: Regan Lemming on 11/22/2015 13:00:48 Brittany Cummings ([name]) -------------------------------------------------------------------------------- Clinic Level of Care Assessment Details Patient Name: Brittany Cummings. Date of Service: 11/22/2015 12:45 PM Medical Record Patient Account Number: [name] Number: Treating RN: Baruch Gouty, RN, BSN, Rita 1966/08/11 [phone] y.o. Other Clinician: Date of  Birth/Sex: Female) Treating ROBSON, [place] Primary Care Physician: Emily Filbert Physician/Extender: G Referring Physician: Clement Sayres in Treatment: 0 Clinic Level of Care Assessment Items TOOL 1 Quantity Score []  - Use when EandM and Procedure is performed on INITIAL visit 0 ASSESSMENTS - Nursing Assessment / Reassessment X - General Physical Exam (combine w/ comprehensive assessment (listed just 1 20 below) when performed on new pt. evals) X - Comprehensive Assessment (HX, ROS, Risk Assessments, Wounds Hx, etc.) 1 25 ASSESSMENTS - Wound and Skin Assessment / Reassessment []  - Dermatologic / Skin Assessment (not related to wound area) 0 ASSESSMENTS - Ostomy and/or Continence Assessment and Care []  - Incontinence Assessment and Management 0 []  - Ostomy Care Assessment and Management (repouching, etc.) 0 PROCESS - Coordination of Care X - Simple Patient / Family Education for ongoing care 1 15 []  - Complex (extensive) Patient / Family Education for ongoing care 0 X - Staff obtains Programmer, systems, Records, Test Results / Process Orders 1 10 []  - Staff telephones HHA, Nursing Homes / Clarify orders / etc 0 []  - Routine Transfer to another Facility (non-emergent condition) 0 []  - Routine Hospital Admission (non-emergent condition) 0 X - New Admissions / Biomedical engineer / Ordering NPWT, Apligraf, etc. 1 15 []  - Emergency Hospital Admission (emergent condition) 0 PROCESS - Special Needs []  - Pediatric / Minor Patient Management 0 Brittany Cummings, Brittany E. ([name]) []  - Isolation Patient Management 0 []  - Hearing / Language / Visual special needs 0 []  - Assessment of Community assistance (transportation, D/C planning, etc.) 0 []  - Additional assistance / Altered mentation 0 []  - Support Surface(s) Assessment (bed, cushion, seat, etc.) 0 INTERVENTIONS - Miscellaneous []  - External ear exam 0 []  - Patient Transfer (multiple staff / Civil Service fast streamer / Similar devices) 0 []  - Simple  Staple / Suture removal (25 or less) 0 []  - Complex Staple / Suture removal (26 or more) 0 []  - Hypo/Hyperglycemic Management (do not check if billed separately) 0 []  - Ankle / Brachial Index (ABI) - do not check if billed separately 0 Has the patient been seen  at the hospital within the last three years: Yes Total Score: 85 Level Of Care: New/Established - Level 3 Electronic Signature(s) Signed: 11/22/2015 3:17:41 PM By: Regan Lemming BSN, RN Entered By: Regan Lemming on 11/22/2015 13:53:53 Brittany Cummings ([name]) -------------------------------------------------------------------------------- Encounter Discharge Information Details Patient Name: Brittany Cummings, Brittany E. Date of Service: 11/22/2015 12:45 PM Medical Record Patient Account Number: [name] Number: Treating RN: Baruch Gouty, RN, BSN, Rita [birth year] [phone] y.o. Other Clinician: Date of Birth/Sex: Female) Treating ROBSON, [place] Primary Care Physician: Emily Filbert Physician/Extender: G Referring Physician: Clement Sayres in Treatment: 0 Encounter Discharge Information Items Discharge Pain Level: 0 Discharge Condition: Stable Ambulatory Status: Ambulatory Discharge Destination: Home Transportation: Private Auto Accompanied By: self Schedule Follow-up Appointment: No Medication Reconciliation completed and provided to Patient/Care No Brittany Cummings: Provided on Clinical Summary of Care: 11/22/2015 Form Type Recipient Paper Patient DB Electronic Signature(s) Signed: 11/22/2015 3:17:41 PM By: Regan Lemming BSN, RN Previous Signature: 11/22/2015 1:53:59 PM Version By: Ruthine Dose Entered By: Regan Lemming on 11/22/2015 13:54:55 [name], [name] ([name]) -------------------------------------------------------------------------------- Multi Wound Chart Details Patient Name: Brittany Decamp E. Date of Service: 11/22/2015 12:45 PM Medical Record Patient Account Number: [name] Number: Treating RN: Baruch Gouty, RN, BSN,  Rita Jan 01, 1966 [phone] y.o. Other Clinician: Date of Birth/Sex: Female) Treating ROBSON, [place] Primary Care Physician: Emily Filbert Physician/Extender: G Referring Physician: Karie Chimera Weeks in Treatment: 0 Vital Signs Height(in): 63 Pulse(bpm): 76 Weight(lbs): 176 Blood Pressure 130/80 (mmHg): Body Mass Index(BMI): 31 Temperature(F): 98 Respiratory Rate 16 (breaths/min): Photos: [1:No Photos] [N/A:N/A] Wound Location: [1:Back - Midline] [N/A:N/A] Wounding Event: [1:Surgical Injury] [N/A:N/A] Primary Etiology: [1:Open Surgical Wound] [N/A:N/A] Comorbid History: [1:Cataracts, Hypertension, N/A Type II Diabetes, Osteoarthritis, Neuropathy] Date Acquired: [1:09/28/2015] [N/A:N/A] Weeks of Treatment: [1:0] [N/A:N/A] Wound Status: [1:Open] [N/A:N/A] Measurements L x W x D 1x0.5x0.2 [N/A:N/A] (cm) Area (cm) : [1:0.393] [N/A:N/A] Volume (cm) : [1:0.079] [N/A:N/A] % Reduction in Area: [1:0.00%] [N/A:N/A] % Reduction in Volume: 0.00% [N/A:N/A] Classification: [1:Full Thickness Without Exposed Support Structures] [N/A:N/A] Exudate Amount: [1:Small] [N/A:N/A] Exudate Type: [1:Serous] [N/A:N/A] Exudate Color: [1:amber] [N/A:N/A] Wound Margin: [1:Distinct, outline attached N/A] Granulation Amount: [1:Large (67-100%)] [N/A:N/A] Necrotic Amount: [1:Small (1-33%)] [N/A:N/A] Exposed Structures: [1:Fascia: No Fat: No Tendon: No] [N/A:N/A] Muscle: No Joint: No Bone: No Limited to Skin Breakdown Debridement: Debridement [id number] N/A N/A [id number]) Time-Out Taken: Yes N/A N/A Pain Control: Lidocaine 4% Topical N/A N/A Solution Tissue Debrided: Fibrin/Slough, N/A N/A Subcutaneous Level: Skin/Subcutaneous N/A N/A Tissue Debridement Area (sq 0.5 N/A N/A cm): Instrument: Curette N/A N/A Bleeding: Minimum N/A N/A Hemostasis Achieved: Pressure N/A N/A Procedural Pain: 0 N/A N/A Post Procedural Pain: 0 N/A N/A Debridement Treatment Procedure was tolerated N/A N/A Response:  well Post Debridement 1x0.5x0.1 N/A N/A Measurements L x W x D (cm) Post Debridement 0.039 N/A N/A Volume: (cm) Periwound Skin Texture: Edema: No N/A N/A Excoriation: No Induration: No Callus: No Crepitus: No Fluctuance: No Friable: No Rash: No Scarring: No Periwound Skin Moist: Yes N/A N/A Moisture: Maceration: No Dry/Scaly: No Periwound Skin Color: Atrophie Blanche: No N/A N/A Cyanosis: No Ecchymosis: No Erythema: No Hemosiderin Staining: No Mottled: No Pallor: No Rubor: No Temperature: No Abnormality N/A N/A Yes N/A N/A Brittany Cummings, Brittany E. ([name]) Tenderness on Palpation: Wound Preparation: Ulcer Cleansing: N/A N/A Rinsed/Irrigated with Saline Topical Anesthetic Applied: Other: lidocaine 4% Procedures Performed: Debridement N/A N/A Treatment Notes Electronic Signature(s) Signed: 11/22/2015 3:17:41 PM By: Regan Lemming BSN, RN Entered By: Regan Lemming on 11/22/2015 13:48:24 Brittany Cummings ([name]) -------------------------------------------------------------------------------- Brittany Cummings Details Patient  Name: Brittany Cummings, SLIMAN. Date of Service: 11/22/2015 12:45 PM Medical Record Patient Account Number: [id number] Number: Treating RN: Baruch Gouty, RN, BSN, Rita September 12, 1966 [phone] y.o. Other Clinician: Date of Birth/Sex: Female) Treating ROBSON, [place] Primary Care Physician: Emily Filbert Physician/Extender: G Referring Physician: Clement Sayres in Treatment: 0 Active Inactive Orientation to the Wound Care Program Nursing Diagnoses: Knowledge deficit related to the wound healing center program Goals: Patient/caregiver will verbalize understanding of the [facility] Date Initiated: 11/22/2015 Goal Status: Active Interventions: Provide education on orientation to the wound center Notes: Wound/Skin Impairment Nursing Diagnoses: Impaired tissue integrity Knowledge deficit related to smoking impact on wound  healing Knowledge deficit related to ulceration/compromised skin integrity Goals: Patient/caregiver will verbalize understanding of skin care regimen Date Initiated: 11/22/2015 Goal Status: Active Ulcer/skin breakdown will have a volume reduction of 30% by week 4 Date Initiated: 11/22/2015 Goal Status: Active Ulcer/skin breakdown will have a volume reduction of 50% by week 8 Date Initiated: 11/22/2015 Goal Status: Active Ulcer/skin breakdown will have a volume reduction of 80% by week 12 Date Initiated: 11/22/2015 Goal Status: Active Brittany Cummings, Brittany Cummings ([id number]) Ulcer/skin breakdown will heal within 14 weeks Date Initiated: 11/22/2015 Goal Status: Active Interventions: Assess patient/caregiver ability to obtain necessary supplies Assess patient/caregiver ability to perform ulcer/skin care regimen upon admission and as needed Assess ulceration(s) every visit Provide education on smoking Provide education on ulcer and skin care Notes: Electronic Signature(s) Signed: 11/22/2015 3:17:41 PM By: Regan Lemming BSN, RN Entered By: Regan Lemming on 11/22/2015 13:46:58 Brittany Cummings, [place]. ([id number]) -------------------------------------------------------------------------------- Pain Assessment Details Patient Name: Brittany Decamp E. Date of Service: 11/22/2015 12:45 PM Medical Record Patient Account Number: [id number] Number: Treating RN: Baruch Gouty, RN, BSN, Rita 26-Feb-1966 [phone] y.o. Other Clinician: Date of Birth/Sex: Female) Treating ROBSON, [place] Primary Care Physician: Emily Filbert Physician/Extender: G Referring Physician: Karie Chimera Weeks in Treatment: 0 Active Problems Location of Pain Severity and Description of Pain Patient Has Paino Yes Site Locations Pain Location: Pain in Ulcers Rate the pain. Current Pain Level: 6 Character of Pain Describe the Pain: Tender Pain Management and Medication Current Pain Management: How does your pain impact your activities of daily  livingo Sleep: Yes Bathing: Yes Appetite: Yes Relationship With Others: Yes Bladder Continence: Yes Emotions: Yes Bowel Continence: Yes Work: Yes Toileting: Yes Drive: Yes Dressing: Yes Hobbies: Yes Electronic Signature(s) Signed: 11/22/2015 3:17:41 PM By: Regan Lemming BSN, RN Entered By: Regan Lemming on 11/22/2015 [place], [place]. ([id number]) -------------------------------------------------------------------------------- Patient/Caregiver Education Details Patient Name: Brittany Cummings. Date of Service: 11/22/2015 12:45 PM Medical Record Patient Account Number: [id number] Number: Treating RN: Baruch Gouty, RN, BSN, Rita 04/08/1966 [phone] y.o. Other Clinician: Date of Birth/Gender: Female) Treating ROBSON, [place] Primary Care Physician: Emily Filbert Physician/Extender: G Referring Physician: Clement Sayres in Treatment: 0 Education Assessment Education Provided To: Patient Education Topics Provided Smoking and Wound Healing: Methods: Explain/Verbal Responses: State content correctly Welcome To [facility]: Methods: Explain/Verbal Responses: State content correctly Wound/Skin Impairment: Methods: Explain/Verbal Responses: State content correctly Electronic Signature(s) Signed: 11/22/2015 3:17:41 PM By: Regan Lemming BSN, RN Entered By: Regan Lemming on 11/22/2015 13:55:09 Brittany Cummings, [place]. ([id number]) -------------------------------------------------------------------------------- Wound Assessment Details Patient Name: Brittany Decamp E. Date of Service: 11/22/2015 12:45 PM Medical Record Patient Account Number: [id number] Number: Treating RN: Baruch Gouty, RN, BSN, Rita [birth year] [phone] y.o. Other Clinician: Date of Birth/Sex: Female) Treating ROBSON, [place] Primary Care Physician: Emily Filbert Physician/Extender: G Referring Physician: Karie Chimera Weeks in Treatment: 0 Wound Status  Wound Number: 1 Primary Open Surgical Wound Etiology: Wound  Location: Back - Midline Wound Open Wounding Event: Surgical Injury Status: Date Acquired: 09/28/2015 Comorbid Cataracts, Hypertension, Type II Weeks Of Treatment: 0 History: Diabetes, Osteoarthritis, Neuropathy Clustered Wound: No Photos Photo Uploaded By: Regan Lemming on 11/22/2015 14:16:26 Wound Measurements Length: (cm) 1 Width: (cm) 0.5 Depth: (cm) 0.2 Area: (cm) 0.393 Volume: (cm) 0.079 % Reduction in Area: 0% % Reduction in Volume: 0% Tunneling: No Undermining: No Wound Description Full Thickness Without Exposed Classification: Support Structures Wound Margin: Distinct, outline attached Exudate Small Amount: Exudate Type: Serous Exudate Color: amber Foul Odor After Cleansing: No Wound Bed Granulation Amount: Large (67-100%) Exposed Structure Brittany Cummings, Brittany E. ([id number]) Necrotic Amount: Small (1-33%) Fascia Exposed: No Necrotic Quality: Adherent Slough Fat Layer Exposed: No Tendon Exposed: No Muscle Exposed: No Joint Exposed: No Bone Exposed: No Limited to Skin Breakdown Periwound Skin Texture Texture Color No Abnormalities Noted: No No Abnormalities Noted: No Callus: No Atrophie Blanche: No Crepitus: No Cyanosis: No Excoriation: No Ecchymosis: No Fluctuance: No Erythema: No Friable: No Hemosiderin Staining: No Induration: No Mottled: No Localized Edema: No Pallor: No Rash: No Rubor: No Scarring: No Temperature / Pain Moisture Temperature: No Abnormality No Abnormalities Noted: No Tenderness on Palpation: Yes Dry / Scaly: No Maceration: No Moist: Yes Wound Preparation Ulcer Cleansing: Rinsed/Irrigated with Saline Topical Anesthetic Applied: Other: lidocaine 4%, Treatment Notes Wound #1 (Midline Back) 1. Cleansed with: Clean wound with Normal Saline 4. Dressing Applied: Prisma Ag 5. Secondary Dressing Applied Bordered Foam Dressing Electronic Signature(s) Signed: 11/22/2015 3:17:41 PM By: Regan Lemming BSN, RN Entered By:  Regan Lemming on 11/22/2015 13:18:42 Brittany Cummings ([id number]) -------------------------------------------------------------------------------- Vitals Details Patient Name: Brittany Decamp E. Date of Service: 11/22/2015 12:45 PM Medical Record Patient Account Number: [id number] Number: Treating RN: Baruch Gouty, RN, BSN, Rita 17-Jan-1966 [phone] y.o. Other Clinician: Date of Birth/Sex: Female) Treating ROBSON, [place] Primary Care Physician: Emily Filbert Physician/Extender: G Referring Physician: Karie Chimera Weeks in Treatment: 0 Vital Signs Time Taken: 13:05 Temperature (F): 98 Height (in): 63 Pulse (bpm): 76 Source: Stated Respiratory Rate (breaths/min): 16 Weight (lbs): 176 Blood Pressure (mmHg): 130/80 Source: Measured Reference Range: 80 - 120 mg / dl Body Mass Index (BMI): 31.2 Electronic Signature(s) Signed: 11/22/2015 3:17:41 PM By: Regan Lemming BSN, RN Entered By: Regan Lemming on 11/22/2015 13:07:01

## 2015-11-23 NOTE — Progress Notes (Signed)
LIZZA, SHIMAZU ([name]) Visit Report for 11/22/2015 Chief Complaint Document Details Patient Name: Brittany Cummings, Brittany Cummings. Date of Service: 11/22/2015 12:45 PM Medical Record Patient Account Number: [name] Number: Treating RN: Baruch Gouty, RN, BSN, Rita 1965-11-25 [phone] y.o. Other Clinician: Date of Birth/Sex: Female) Treating Habeeb Puertas Primary Care Physician/Extender: Claudette Laws Physician: Referring Physician: Karie Chimera Weeks in Treatment: 0 Information Obtained from: Patient Chief Complaint Patient is here for review of a nonhealing wound on a lumbar surgical site from bilateral decompression and fusion surgery she had on 09/15/15 Electronic Signature(s) Signed: 11/22/2015 3:38:56 PM By: Linton Ham MD Entered By: Linton Ham on 11/22/2015 15:02:23 Rennis Harding ([name]) -------------------------------------------------------------------------------- Debridement Details Patient Name: Brittany Decamp E. Date of Service: 11/22/2015 12:45 PM Medical Record Patient Account Number: [name] Number: Treating RN: Baruch Gouty, RN, BSN, Rita 10-23-1965 [phone] y.o. Other Clinician: Date of Birth/Sex: Female) Treating Cressida Milford Primary Care Physician/Extender: Claudette Laws Physician: Referring Physician: Clement Sayres in Treatment: 0 Debridement Performed for Wound #1 Midline Back Assessment: Performed By: Physician Ricard Dillon, MD Debridement: Debridement Pre-procedure Yes Verification/Time Out Taken: Start Time: 13:43 Pain Control: Lidocaine 4% Topical Solution Level: Skin/Subcutaneous Tissue Total Area Debrided (L x 1 (cm) x 0.5 (cm) = 0.5 (cm) W): Tissue and other Non-Viable, Fibrin/Slough, Subcutaneous material debrided: Instrument: Curette Bleeding: Minimum Hemostasis Achieved: Pressure End Time: 13:47 Procedural Pain: 0 Post Procedural Pain: 0 Response to Treatment: Procedure was tolerated well Post Debridement  Measurements of Total Wound Length: (cm) 1 Width: (cm) 0.5 Depth: (cm) 0.1 Volume: (cm) 0.039 Post Procedure Diagnosis Same as Pre-procedure Electronic Signature(s) Signed: 11/22/2015 3:17:41 PM By: Regan Lemming BSN, RN Signed: 11/22/2015 3:38:56 PM By: Linton Ham MD Entered By: Linton Ham on 11/22/2015 15:00:17 Rennis Harding ([name]) Delfina Redwood, Alton E. ([name]) -------------------------------------------------------------------------------- HPI Details Patient Name: [name], [name] Date of Service: 11/22/2015 12:45 PM Medical Record Patient Account Number: [name] Number: Treating RN: Baruch Gouty, RN, BSN, Rita 08/24/1966 [phone] y.o. Other Clinician: Date of Birth/Sex: Female) Treating Rehmat Murtagh Primary Care Physician/Extender: Claudette Laws Physician: Referring Physician: Karie Chimera Weeks in Treatment: 0 History of Present Illness HPI Description: 11/22/15; this is a 50 year old woman who had back pain radiating into her bilateral legs up. 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 She underwent a bilateral decompression and fusion on 09/15/15. The surgery and immediate postoperative period were apparently uneventful. 2 weeks later she started to develop pain and swelling on the right side of her wound. She was seen by her surgeon Dr. Hal Neer. She apparently had an MRI that showed a hematoma collection although I don't see this result in [facility]. Dr. Hal Neer gave her Keflex for 10 days. She in the same time frame saw her primary physician who saw a scab on the wound and applied topical antibiotic creams. She is here for our review of the surgical site wound. The patient is a diabetic on oral agents and insulin but no prior wound issues. Electronic Signature(s) Signed: 11/22/2015 3:38:56 PM By: Linton Ham MD Entered By: Linton Ham on 11/22/2015 [place], [place].  ([name]) -------------------------------------------------------------------------------- Physical Exam Details Patient Name: [name], [name] Date of Service: 11/22/2015 12:45 PM Medical Record Patient Account Number: [name] Number: Treating RN: Baruch Gouty, RN, BSN, Rita 1966-04-02 [phone] y.o. Other Clinician: Date of Birth/Sex: Female) Treating Torrez Renfroe Primary Care Physician/Extender: Claudette Laws Physician: Referring Physician: Karie Chimera Weeks in Treatment: 0 Constitutional Blood pressures in the normal  range. Normal respiratory rate and effort. No fever. Appears stable. Respiratory No respiratory distress. Air entry is clear. However there is mild expiratory wheezing. The patient is a smoker. Cardiovascular Heart sounds are normal no murmur she appears to be euvolemic. Notes Wound exam; the area in question is in the superior part of the original surgical incision. This underwent a small surgical debridement to remove nonviable eschar and subcutaneous tissue. The base of this wound which is roughly a centimeter in length appears healthy. There is certainly no infection here swelling around her wound. No remnants of the hematoma that the patient seems to be describing some time after her original surgery. There was nothing here to culture and no need for antibiotics Electronic Signature(s) Signed: 11/22/2015 3:38:56 PM By: Linton Ham MD Entered By: Linton Ham on 11/22/2015 15:09:37 Rennis Harding ([name]) -------------------------------------------------------------------------------- Physician Orders Details Patient Name: Brittany Decamp E. Date of Service: 11/22/2015 12:45 PM Medical Record Patient Account Number: [name] Number: Treating RN: Baruch Gouty, RN, BSN, Rita Jan 24, 1966 [phone] y.o. Other Clinician: Date of Birth/Sex: Female) Treating Abenezer Odonell Primary Care Physician/Extender: Claudette Laws Physician: Referring  Physician: Clement Sayres in Treatment: 0 Verbal / Phone Orders: Yes Clinician: Afful, RN, BSN, Rita Read Back and Verified: Yes Diagnosis Coding Wound Cleansing Wound #1 Midline Back o Clean wound with Normal Saline. Anesthetic Wound #1 Midline Back o Topical Lidocaine 4% cream applied to wound bed prior to debridement Primary Wound Dressing Wound #1 Midline Back o Prisma Ag Secondary Dressing Wound #1 Midline Back o Boardered Foam Dressing Dressing Change Frequency Wound #1 Midline Back o Change dressing every other day. Follow-up Appointments Wound #1 Midline Back o Return Appointment in 1 week. Additional Orders / Instructions Wound #1 Midline Back o Stop Smoking o Increase protein intake. o Activity as tolerated Electronic Signature(s) AYLINE, FABRIZIO ([name]) Signed: 11/22/2015 3:17:41 PM By: Regan Lemming BSN, RN Signed: 11/22/2015 3:38:56 PM By: Linton Ham MD Entered By: Regan Lemming on 11/22/2015 13:50:20 Rennis Harding ([name]) -------------------------------------------------------------------------------- Problem List Details Patient Name: JOLE, BRANNAM E. Date of Service: 11/22/2015 12:45 PM Medical Record Patient Account Number: [name] Number: Treating RN: Baruch Gouty, RN, BSN, Rita Jul 23, 1966 [phone] y.o. Other Clinician: Date of Birth/Sex: Female) Treating Lui Bellis Primary Care Physician/Extender: Claudette Laws Physician: Referring Physician: Karie Chimera Weeks in Treatment: 0 Active Problems ICD-10 Encounter Code Description Active Date Diagnosis T81.31XA Disruption of external operation (surgical) wound, not 11/22/2015 Yes elsewhere classified, initial encounter M43.16 Spondylolisthesis, lumbar region 11/22/2015 Yes Inactive Problems Resolved Problems Electronic Signature(s) Signed: 11/22/2015 3:38:56 PM By: Linton Ham MD Entered By: Linton Ham on 11/22/2015 14:59:58 Taliaferro, Gesenia E.  ([name]) -------------------------------------------------------------------------------- Progress Note Details Patient Name: Brittany Decamp E. Date of Service: 11/22/2015 12:45 PM Medical Record Patient Account Number: [name] Number: Treating RN: Baruch Gouty, RN, BSN, Rita 04-Feb-1966 [phone] y.o. Other Clinician: Date of Birth/Sex: Female) Treating Mahrosh Donnell Primary Care Physician/Extender: Claudette Laws Physician: Referring Physician: Karie Chimera Weeks in Treatment: 0 Subjective Chief Complaint Information obtained from Patient Patient is here for review of a nonhealing wound on a lumbar surgical site from bilateral decompression and fusion surgery she had on 09/15/15 History of Present Illness (HPI) 11/22/15; this is a 50 year old woman who had back pain radiating into her bilateral legs up. 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 She underwent a bilateral decompression and fusion on 09/15/15. The surgery and immediate postoperative period were apparently uneventful. 2 weeks later she started to  develop pain and swelling on the right side of her wound. She was seen by her surgeon Dr. Hal Neer. She apparently had an MRI that showed a hematoma collection although I don't see this result in [facility]. Dr. Hal Neer gave her Keflex for 10 days. She in the same time frame saw her primary physician who saw a scab on the wound and applied topical antibiotic creams. She is here for our review of the surgical site wound. The patient is a diabetic on oral agents and insulin but no prior wound issues. Wound History Patient presents with 1 open wound that has been present for approximately 1 month. Patient has been treating wound in the following manner: dry dressing. Laboratory tests have not been performed in the last month. Patient reportedly has not tested positive for an antibiotic resistant organism. Patient reportedly has not tested positive  for osteomyelitis. Patient reportedly has not had testing performed to evaluate circulation in the legs. Patient History Information obtained from Patient. Allergies Demerol Family History Cancer - Father, Mother, Diabetes - Maternal Grandparents, Paternal Grandparents, Heart Disease - Paternal Grandparents, Maternal Grandparents, Hypertension - Paternal Grandparents, Maternal Grandparents, Stroke - Maternal Grandparents, ELLIETTE, THIBODAUX. ([name]) No family history of Hereditary Spherocytosis, Kidney Disease, Lung Disease, Seizures, Thyroid Problems, Tuberculosis. Social History Current every day smoker, Marital Status - Married, Alcohol Use - Never, Drug Use - No History, Caffeine Use - Daily - cokes. Medical History Eyes Patient has history of Cataracts - right eye cataract Denies history of Glaucoma, Optic Neuritis Ear/Nose/Mouth/Throat Denies history of Chronic sinus problems/congestion, Middle ear problems Hematologic/Lymphatic Denies history of Anemia, Hemophilia, Human Immunodeficiency Virus, Lymphedema, Sickle Cell Disease Respiratory Denies history of Aspiration, Asthma, Chronic Obstructive Pulmonary Disease (COPD), Pneumothorax, Sleep Apnea, Tuberculosis Cardiovascular Patient has history of Hypertension Endocrine Patient has history of Type II Diabetes Genitourinary Denies history of End Stage Renal Disease Immunological Denies history of Lupus Erythematosus, Raynaud s, Scleroderma Integumentary (Skin) Denies history of History of Burn, History of pressure wounds Musculoskeletal Patient has history of Osteoarthritis Neurologic Patient has history of Neuropathy Denies history of Dementia, Quadriplegia, Paraplegia, Seizure Disorder Oncologic Denies history of Received Chemotherapy, Received Radiation Patient is treated with Insulin, Oral Agents. Blood sugar is not tested. Blood sugar results noted at the following times: Breakfast - 130s. 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 Eyes Complains or has symptoms of Glasses / Contacts. Ear/Nose/Mouth/Throat JENICE, GENTHNER ([name]) The patient has no complaints or symptoms. Hematologic/Lymphatic The patient has no complaints or symptoms. Respiratory The patient has no complaints or symptoms. Cardiovascular The patient has no complaints or symptoms. Gastrointestinal The patient has no complaints or symptoms. Endocrine The patient has no complaints or symptoms. Genitourinary The patient has no complaints or symptoms. Immunological The patient has no complaints or symptoms. Integumentary (Skin) Complains or has symptoms of Wounds. Musculoskeletal The patient has no complaints or symptoms. Neurologic The patient has no complaints or symptoms. Oncologic The patient has no complaints or symptoms. Psychiatric The patient has no complaints or symptoms. Objective Constitutional Blood pressures in the normal range. Normal respiratory rate and effort. No fever. Appears stable. Vitals Time Taken: 1:05 PM, Height: 63 in, Source: Stated, Weight: 176 lbs, Source: Measured, BMI: 31.2, Temperature: 98 F, Pulse: 76 bpm, Respiratory Rate: 16 breaths/min, Blood Pressure: 130/80 mmHg. Respiratory No respiratory distress. Air entry is clear. However there is  mild expiratory wheezing. The patient is a smoker. Cardiovascular Heart sounds are normal no murmur she appears to be euvolemic. General Notes: Wound exam; the area in question is in the superior part of the original surgical incision. This underwent a small surgical debridement to remove nonviable eschar and subcutaneous tissue. The Plath, Loretta E. ([id number]) base of this wound which is roughly a centimeter in length appears healthy. There is certainly no  infection here swelling around her wound. No remnants of the hematoma that the patient seems to be describing some time after her original surgery. There was nothing here to culture and no need for antibiotics Integumentary (Hair, Skin) Wound #1 status is Open. Original cause of wound was Surgical Injury. The wound is located on the Midline Back. The wound measures 1cm length x 0.5cm width x 0.2cm depth; 0.393cm^2 area and 0.079cm^3 volume. The wound is limited to skin breakdown. There is no tunneling or undermining noted. There is a small amount of serous drainage noted. The wound margin is distinct with the outline attached to the wound base. There is large (67-100%) granulation within the wound bed. There is a small (1-33%) amount of necrotic tissue within the wound bed including Adherent Slough. The periwound skin appearance exhibited: Moist. The periwound skin appearance did not exhibit: Callus, Crepitus, Excoriation, Fluctuance, Friable, Induration, Localized Edema, Rash, Scarring, Dry/Scaly, Maceration, Atrophie Blanche, Cyanosis, Ecchymosis, Hemosiderin Staining, Mottled, Pallor, Rubor, Erythema. Periwound temperature was noted as No Abnormality. The periwound has tenderness on palpation. Assessment Active Problems ICD-10 T81.31XA - Disruption of external operation (surgical) wound, not elsewhere classified, initial encounter M43.16 - Spondylolisthesis, lumbar region Procedures Wound #1 Wound #1 is an Open Surgical Wound located on the Midline Back . There was a Skin/Subcutaneous Tissue Debridement [id number]) debridement with total area of 0.5 sq cm performed by Ricard Dillon, MD. with the following instrument(s): Curette to remove Non-Viable tissue/material including Fibrin/Slough and Subcutaneous after achieving pain control using Lidocaine 4% Topical Solution. A time out was conducted prior to the start of the procedure. A Minimum amount of bleeding was controlled  with Pressure. The procedure was tolerated well with a pain level of 0 throughout and a pain level of 0 following the procedure. Post Debridement Measurements: 1cm length x 0.5cm width x 0.1cm depth; 0.039cm^3 volume. Post procedure Diagnosis Wound #1: Same as Pre-Procedure Toner, Skarlette E. ([id number]) Plan Wound Cleansing: Wound #1 Midline Back: Clean wound with Normal Saline. Anesthetic: Wound #1 Midline Back: Topical Lidocaine 4% cream applied to wound bed prior to debridement Primary Wound Dressing: Wound #1 Midline Back: Prisma Ag Secondary Dressing: Wound #1 Midline Back: Boardered Foam Dressing Dressing Change Frequency: Wound #1 Midline Back: Change dressing every other day. Follow-up Appointments: Wound #1 Midline Back: Return Appointment in 1 week. Additional Orders / Instructions: Wound #1 Midline Back: Stop Smoking Increase protein intake. Activity as tolerated #1 we applied Prisma hydrogel and border foam. Dressing can be changed every second or third day. We will review this again in a week. Electronic Signature(s) Signed: 11/22/2015 3:38:56 PM By: Linton Ham MD Entered By: Linton Ham on 11/22/2015 15:10:30 Rennis Harding ([id number]) -------------------------------------------------------------------------------- ROS/PFSH Details Patient Name: GURSIRAT, LAVIOLETTE. Date of Service: 11/22/2015 12:45 PM Medical Record Patient Account Number: [id number] Number: Treating RN: Baruch Gouty, RN, BSN, Rita 09-12-1966 [phone] y.o. Other Clinician: Date of Birth/Sex: Female) Treating Taylynn Easton Primary Care Physician/Extender: Claudette Laws Physician: Referring Physician: Karie Chimera Weeks in Treatment: 0 Information Obtained From Patient Wound History Do you  currently have one or more open woundso Yes How many open wounds do you currently haveo 1 Approximately how long have you had your woundso 1 month How have you been treating your wound(s)  until nowo dry dressing Has your wound(s) ever healed and then re-openedo No Have you had any lab work done in the past montho No Have you tested positive for an antibiotic resistant organism (MRSA, VRE)o No Have you tested positive for osteomyelitis (bone infection)o No Have you had any tests for circulation on your legso No Eyes Complaints and Symptoms: Positive for: Glasses / Contacts Medical History: Positive for: Cataracts - right eye cataract Negative for: Glaucoma; Optic Neuritis Integumentary (Skin) Complaints and Symptoms: Positive for: Wounds Medical History: Negative for: History of Burn; History of pressure wounds Constitutional Symptoms (General Health) Complaints and Symptoms: No Complaints or Symptoms Ear/Nose/Mouth/Throat Complaints and Symptoms: No Complaints or Symptoms Stadel, Vonceil E. ([name]) Medical History: Negative for: Chronic sinus problems/congestion; Middle ear problems Hematologic/Lymphatic Complaints and Symptoms: No Complaints or Symptoms Medical History: Negative for: Anemia; Hemophilia; Human Immunodeficiency Virus; Lymphedema; Sickle Cell Disease Respiratory Complaints and Symptoms: No Complaints or Symptoms Medical History: Negative for: Aspiration; Asthma; Chronic Obstructive Pulmonary Disease (COPD); Pneumothorax; Sleep Apnea; Tuberculosis Cardiovascular Complaints and Symptoms: No Complaints or Symptoms Medical History: Positive for: Hypertension Gastrointestinal Complaints and Symptoms: No Complaints or Symptoms Medical History: Past Medical History Notes: gastric bypass 09/2014, gastric ulcers Endocrine Complaints and Symptoms: No Complaints or Symptoms Medical History: Positive for: Type II Diabetes Time with diabetes: 8 years Treated with: Insulin, Oral agents Blood sugar tested every day: No Blood sugar testing results: Breakfast: 130s Swiss, Jamye E. ([name]) Genitourinary Complaints and Symptoms: No  Complaints or Symptoms Medical History: Negative for: End Stage Renal Disease Immunological Complaints and Symptoms: No Complaints or Symptoms Medical History: Negative for: Lupus Erythematosus; Raynaudos; Scleroderma Musculoskeletal Complaints and Symptoms: No Complaints or Symptoms Medical History: Positive for: Osteoarthritis Neurologic Complaints and Symptoms: No Complaints or Symptoms Medical History: Positive for: Neuropathy Negative for: Dementia; Quadriplegia; Paraplegia; Seizure Disorder Oncologic Complaints and Symptoms: No Complaints or Symptoms Medical History: Negative for: Received Chemotherapy; Received Radiation Psychiatric Complaints and Symptoms: No Complaints or Symptoms Medical History: Past Medical History Notes: Depression HBO Extended History Items TAKYLA, BOIK. ([name]) Eyes: Cataracts Family and Social History Cancer: Yes - Father, Mother; Diabetes: Yes - Maternal Grandparents, Paternal Grandparents; Heart Disease: Yes - Paternal Grandparents, Maternal Grandparents; Hereditary Spherocytosis: No; Hypertension: Yes - Paternal Grandparents, Maternal Grandparents; Kidney Disease: No; Lung Disease: No; Seizures: No; Stroke: Yes - Maternal Grandparents; Thyroid Problems: No; Tuberculosis: No; Current every day smoker; Marital Status - Married; Alcohol Use: Never; Drug Use: No History; Caffeine Use: Daily - cokes; Financial Concerns: No; Food, Clothing or Shelter Needs: No; Support System Lacking: No; Transportation Concerns: No; Advanced Directives: No; Patient does not want information on Advanced Directives; Living Will: No Electronic Signature(s) Signed: 11/22/2015 3:17:41 PM By: Regan Lemming BSN, RN Signed: 11/22/2015 3:38:56 PM By: Linton Ham MD Entered By: Regan Lemming on 11/22/2015 13:12:56 Rennis Harding ([name]) -------------------------------------------------------------------------------- SuperBill Details Patient Name:  Brittany Decamp E. Date of Service: 11/22/2015 Medical Record Patient Account Number: [name] Number: Treating RN: Baruch Gouty, RN, BSN, Rita [birth year] [phone] y.o. Other Clinician: Date of Birth/Sex: Female) Treating Micala Saltsman Primary Care Physician/Extender: Claudette Laws Physician: Suella [place] in Treatment: 0 Referring Physician: Karie Chimera Diagnosis Coding ICD-10 Codes Code Description Disruption of external operation (surgical) wound, not elsewhere classified, initial T81.31XA encounter M43.16 Spondylolisthesis, lumbar region Facility  Procedures CPT4: Description Modifier Quantity Code [id number] - WOUND CARE VISIT-LEV 3 EST PT 1 CPT4: [id number] - DEB SUBQ TISSUE 20 SQ CM/< 1 ICD-10 Description Diagnosis T81.31XA Disruption of external operation (surgical) wound, not elsewhere classified, initial encounter Physician Procedures CPT4: Description Modifier Quantity Code [id number] - WC PHYS LEVEL 2 - NEW PT 1 ICD-10 Description Diagnosis T81.31XA Disruption of external operation (surgical) wound, not elsewhere classified, initial encounter CPT4: [id number] - WC PHYS SUBQ TISS 20 SQ CM 1 ICD-10 Description Diagnosis T81.31XA Disruption of external operation (surgical) wound, not elsewhere classified, initial encounter TYRIONNA, AANENSON ([name]) Electronic Signature(s) Signed: 11/22/2015 3:38:56 PM By: Linton Ham MD Entered By: Linton Ham on 11/22/2015 15:11:01

## 2015-11-29 ENCOUNTER — Encounter: Payer: Medicare Other | Admitting: Internal Medicine

## 2015-11-29 DIAGNOSIS — T8131XA Disruption of external operation (surgical) wound, not elsewhere classified, initial encounter: Secondary | ICD-10-CM | POA: Diagnosis not present

## 2015-12-01 NOTE — Progress Notes (Signed)
LAYTON, SOZIO ([id number]) Visit Report for 11/29/2015 Chief Complaint Document Details Patient Name: Brittany, Cummings. Date of Service: 11/29/2015 11:00 AM Medical Record Patient Account Number: [id number] Number: Treating RN: Ahmed Prima [birth year] (49 y.o. Other Clinician: Date of Birth/Sex: Female) Treating Koen Antilla Primary Care Physician/Extender: Claudette Laws Physician: Referring Physician: Melina Modena in Treatment: 1 Information Obtained from: Patient Chief Complaint Patient is here for review of a nonhealing wound on a lumbar surgical site from bilateral decompression and fusion surgery she had on 09/15/15 Electronic Signature(s) Signed: 11/30/2015 5:04:49 PM By: Linton Ham MD Entered By: Linton Ham on 11/29/2015 12:50:33 Brittany Cummings ([id number]) -------------------------------------------------------------------------------- Debridement Details Patient Name: Brittany Decamp E. Date of Service: 11/29/2015 11:00 AM Medical Record Patient Account Number: [id number] Number: Treating RN: Ahmed Prima 04/21/1966 (49 y.o. Other Clinician: Date of Birth/Sex: Female) Treating San Lohmeyer Primary Care Physician/Extender: Claudette Laws Physician: Referring Physician: Melina Modena in Treatment: 1 Debridement Performed for Wound #1 Midline Back Assessment: Performed By: Physician Ricard Dillon, MD Debridement: Debridement Pre-procedure Yes Verification/Time Out Taken: Start Time: 11:23 Pain Control: Other : lidocaine 4% Level: Skin/Subcutaneous Tissue Total Area Debrided (L x 1.5 (cm) x 0.4 (cm) = 0.6 (cm) W): Tissue and other Viable, Non-Viable, Exudate, Fibrin/Slough, Subcutaneous material debrided: Instrument: Blade Bleeding: Minimum Hemostasis Achieved: Pressure End Time: 11:26 Procedural Pain: 0 Post Procedural Pain: 0 Response to Treatment: Procedure was tolerated well Post Debridement  Measurements of Total Wound Length: (cm) 1.5 Width: (cm) 0.4 Depth: (cm) 0.2 Volume: (cm) 0.094 Post Procedure Diagnosis Same as Pre-procedure Electronic Signature(s) Signed: 11/29/2015 4:05:48 PM By: Alric Quan Signed: 11/30/2015 5:04:49 PM By: Linton Ham MD Entered By: Linton Ham on 11/29/2015 12:50:18 Brittany Cummings ([id number]) Brittany Cummings, Brittany E. ([id number]) -------------------------------------------------------------------------------- HPI Details Patient Name: Brittany Decamp E. Date of Service: 11/29/2015 11:00 AM Medical Record Patient Account Number: [id number] Number: Treating RN: Ahmed Prima April 10, 1966 (49 y.o. Other Clinician: Date of Birth/Sex: Female) Treating Johnnisha Forton Primary Care Physician/Extender: Claudette Laws Physician: Referring Physician: Melina Modena in Treatment: 1 History of Present Illness HPI Description: 11/22/15; this is a 50 year old woman who had back pain radiating into her bilateral legs up. 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 She underwent a bilateral decompression and fusion on 09/15/15. The surgery and immediate postoperative period were apparently uneventful. 2 weeks later she started to develop pain and swelling on the right side of her wound. She was seen by her surgeon Dr. Hal Neer. She apparently had an MRI that showed a hematoma collection although I don't see this result in [place] [facility]. Dr. Hal Neer gave her Keflex for 10 days. She in the same time frame saw her primary physician who saw a scab on the wound and applied topical antibiotic creams. She is here for our review of the surgical site wound. The patient is a diabetic on oral agents and insulin but no prior wound issues. 11/29/15; patient arrives back today. She has been applying Prisma hydrogel with a foam cover, her husband has been changing this. She is still complaining of back pain but has had no wound  issues. Electronic Signature(s) Signed: 11/30/2015 5:04:49 PM By: Linton Ham MD Entered By: Linton Ham on 11/29/2015 12:51:09 Brittany Cummings ([id number]) -------------------------------------------------------------------------------- Physical Exam Details Patient Name: Brittany Cummings, Brittany E. Date of Service: 11/29/2015 11:00 AM Medical Record Patient Account Number: [id number] Number: Treating RN: Ahmed Prima 08-Sep-1966 (49 y.o. Other Clinician: Date of  Birth/Sex: Female) Treating Gisele Pack Primary Care Physician/Extender: Claudette Laws Physician: Referring Physician: Melina Modena in Treatment: 1 Notes Wound exam; the superior part of her surgical incision has closed over completely and is epithelialized. There is still a small area of the required surgical debridement just below that superior edge. There is still a small open wound here. I think this will probably be healed by next week Electronic Signature(s) Signed: 11/30/2015 5:04:49 PM By: Linton Ham MD Entered By: Linton Ham on 11/29/2015 12:51:46 Brittany Cummings ([id number]) -------------------------------------------------------------------------------- Physician Orders Details Patient Name: Brittany Decamp E. Date of Service: 11/29/2015 11:00 AM Medical Record Patient Account Number: [id number] Number: Treating RN: Ahmed Prima [birth year] (49 y.o. Other Clinician: Date of Birth/Sex: Female) Treating Myshawn Chir[place]oga Primary Care Physician/Extender: Claudette Laws Physician: Referring Physician: Melina Modena in Treatment: 1 Verbal / Phone Orders: Yes Clinician: Carolyne Fiscal, Debi Read Back and Verified: Yes Diagnosis Coding Wound Cleansing Wound #1 Midline Back o Clean wound with Normal Saline. Anesthetic Wound #1 Midline Back o Topical Lidocaine 4% cream applied to wound bed prior to debridement Primary Wound Dressing Wound #1 Midline Back o Prisma  Ag Dressing Change Frequency Wound #1 Midline Back o Change dressing every other day. Follow-up Appointments Wound #1 Midline Back o Return Appointment in 1 week. Additional Orders / Instructions Wound #1 Midline Back o Stop Smoking o Increase protein intake. o Activity as tolerated Notes telfa Manufacturing systems engineer) Signed: 11/29/2015 4:05:48 PM By: Alric Quan Signed: 11/30/2015 5:04:49 PM By: Linton Ham MD [place], Brittany Cummings ([id number]) Entered By: Alric Quan on 11/29/2015 11:27:53 Brittany Cummings ([id number]) -------------------------------------------------------------------------------- Problem List Details Patient Name: Brittany Cummings, Brittany Cummings. Date of Service: 11/29/2015 11:00 AM Medical Record Patient Account Number: [id number] Number: Treating RN: Ahmed Prima 08-03-1966 (49 y.o. Other Clinician: Date of Birth/Sex: Female) Treating Timothy Townsel Primary Care Physician/Extender: Claudette Laws Physician: Referring Physician: Melina Modena in Treatment: 1 Active Problems ICD-10 Encounter Code Description Active Date Diagnosis T81.31XA Disruption of external operation (surgical) wound, not 11/22/2015 Yes elsewhere classified, initial encounter M43.16 Spondylolisthesis, lumbar region 11/22/2015 Yes Inactive Problems Resolved Problems Electronic Signature(s) Signed: 11/30/2015 5:04:49 PM By: Linton Ham MD Entered By: Linton Ham on 11/29/2015 12:47:48 Brittany Cummings, Brittany E. ([id number]) -------------------------------------------------------------------------------- Progress Note Details Patient Name: Brittany Decamp E. Date of Service: 11/29/2015 11:00 AM Medical Record Patient Account Number: [id number] Number: Treating RN: Ahmed Prima 1966/07/23 (49 y.o. Other Clinician: Date of Birth/Sex: Female) Treating Brittany Cummings Primary Care Physician/Extender: Claudette Laws Physician: Referring Physician:  Melina Modena in Treatment: 1 Subjective Chief Complaint Information obtained from Patient Patient is here for review of a nonhealing wound on a lumbar surgical site from bilateral decompression and fusion surgery she had on 09/15/15 History of Present Illness (HPI) 11/22/15; this is a 50 year old woman who had back pain radiating into her bilateral legs up. 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 She underwent a bilateral decompression and fusion on 09/15/15. The surgery and immediate postoperative period were apparently uneventful. 2 weeks later she started to develop pain and swelling on the right side of her wound. She was seen by her surgeon Dr. Hal Neer. She apparently had an MRI that showed a hematoma collection although I don't see this result in [place] [facility]. Dr. Hal Neer gave her Keflex for 10 days. She in the same time frame saw her primary physician who saw a scab on the wound and applied topical antibiotic creams. She  is here for our review of the surgical site wound. The patient is a diabetic on oral agents and insulin but no prior wound issues. 11/29/15; patient arrives back today. She has been applying Prisma hydrogel with a foam cover, her husband has been changing this. She is still complaining of back pain but has had no wound issues. Objective Constitutional Vitals Time Taken: 11:03 AM, Height: 63 in, Weight: 176 lbs, BMI: 31.2, Temperature: 98.2 F, Pulse: 96 bpm, Respiratory Rate: 18 breaths/min, Blood Pressure: 154/76 mmHg. Integumentary (Hair, Skin) Wound #1 status is Open. Original cause of wound was Surgical Injury. The wound is located on the Midline Back. The wound measures 1.5cm length x 0.4cm width x 0.1cm depth; 0.471cm^2 area and 0.047cm^3 volume. The wound is limited to skin breakdown. There is no tunneling or undermining noted. There is a small amount of serous drainage noted. The wound margin is distinct with the outline  attached to the wound Brittany Cummings, Brittany Cummings ([id number]) base. There is large (67-100%) granulation within the wound bed. There is a small (1-33%) amount of necrotic tissue within the wound bed including Adherent Slough. The periwound skin appearance exhibited: Moist. The periwound skin appearance did not exhibit: Callus, Crepitus, Excoriation, Fluctuance, Friable, Induration, Localized Edema, Rash, Scarring, Dry/Scaly, Maceration, Atrophie Blanche, Cyanosis, Ecchymosis, Hemosiderin Staining, Mottled, Pallor, Rubor, Erythema. Periwound temperature was noted as No Abnormality. The periwound has tenderness on palpation. Assessment Active Problems ICD-10 T81.31XA - Disruption of external operation (surgical) wound, not elsewhere classified, initial encounter M43.16 - Spondylolisthesis, lumbar region Procedures Wound #1 Wound #1 is an Open Surgical Wound located on the Midline Back . There was a Skin/Subcutaneous Tissue Debridement [id number]) debridement with total area of 0.6 sq cm performed by Ricard Dillon, MD. with the following instrument(s): Blade to remove Viable and Non-Viable tissue/material including Exudate, Fibrin/Slough, and Subcutaneous after achieving pain control using Other (lidocaine 4%). A time out was conducted prior to the start of the procedure. A Minimum amount of bleeding was controlled with Pressure. The procedure was tolerated well with a pain level of 0 throughout and a pain level of 0 following the procedure. Post Debridement Measurements: 1.5cm length x 0.4cm width x 0.2cm depth; 0.094cm^3 volume. Post procedure Diagnosis Wound #1: Same as Pre-Procedure Plan Wound Cleansing: Wound #1 Midline Back: Clean wound with Normal Saline. Anesthetic: Wound #1 Midline Back: Topical Lidocaine 4% cream applied to wound bed prior to debridement Brittany Cummings, Brittany E. ([id number]) Primary Wound Dressing: Wound #1 Midline Back: Prisma Ag Dressing Change Frequency: Wound #1  Midline Back: Change dressing every other day. Follow-up Appointments: Wound #1 Midline Back: Return Appointment in 1 week. Additional Orders / Instructions: Wound #1 Midline Back: Stop Smoking Increase protein intake. Activity as tolerated General Notes: [place] #1 we will continue Prisma hydrogel with a foam dressing that can be changed every second day. I am hopeful this will be healed by next week Electronic Signature(s) Signed: 11/30/2015 5:04:49 PM By: Linton Ham MD Entered By: Linton Ham on 11/29/2015 12:52:24 Brittany Cummings, Brittany Cummings ([id number]) -------------------------------------------------------------------------------- SuperBill Details Patient Name: Brittany Decamp E. Date of Service: 11/29/2015 Medical Record Patient Account Number: [id number] Number: Treating RN: Ahmed Prima [birth year] (49 y.o. Other Clinician: Date of Birth/Sex: Female) Treating Goldy Calandra Primary Care Physician/Extender: Claudette Laws Physician: Suella [place] in Treatment: 1 Referring Physician: Emily Filbert Diagnosis Coding ICD-10 Codes Code Description Disruption of external operation (surgical) wound, not elsewhere classified, initial T81.31XA encounter M43.16 Spondylolisthesis, lumbar region Facility Procedures CPT4: Description Modifier Quantity Code  [id number] - DEB SUBQ TISSUE 20 SQ CM/< 1 ICD-10 Description Diagnosis T81.31XA Disruption of external operation (surgical) wound, not elsewhere classified, initial encounter Physician Procedures CPT4: Description Modifier Quantity Code [id number] - WC PHYS SUBQ TISS 20 SQ CM 1 ICD-10 Description Diagnosis T81.31XA Disruption of external operation (surgical) wound, not elsewhere classified, initial encounter Electronic Signature(s) Signed: 11/30/2015 5:04:49 PM By: Linton Ham MD Entered By: Linton Ham on 11/29/2015 12:53:17

## 2015-12-01 NOTE — Progress Notes (Signed)
BREANNON, LEMERY ([id number]) Visit Report for 11/29/2015 Arrival Information Details Patient Name: Brittany Cummings, Brittany Cummings. Date of Service: 11/29/2015 11:00 AM Medical Record Patient Account Number: [id number] Number: Treating RN: Ahmed Prima 1966/01/17 (50 y.o. Other Clinician: Date of Birth/Sex: Female) Treating ROBSON, MICHAEL Primary Care Physician: Emily Filbert Physician/Extender: G Referring Physician: Melina Modena in Treatment: 1 Visit Information History Since Last Visit All ordered tests and consults were completed: No Patient Arrived: Ambulatory Added or deleted any medications: No Arrival Time: 11:01 Any new allergies or adverse reactions: No Accompanied By: self Had a fall or experienced change in No Transfer Assistance: None activities of daily living that may affect Patient Requires Transmission-Based No risk of falls: Precautions: Signs or symptoms of abuse/neglect since last No Patient Has Alerts: No visito Hospitalized since last visit: No Pain Present Now: Yes Electronic Signature(s) Signed: 11/29/2015 4:05:48 PM By: Alric Quan Entered By: Alric Quan on 11/29/2015 11:01:21 Brittany Cummings ([id number]) -------------------------------------------------------------------------------- Encounter Discharge Information Details Patient Name: Brittany Decamp E. Date of Service: 11/29/2015 11:00 AM Medical Record Patient Account Number: [id number] Number: Treating RN: Ahmed Prima Jan 24, 1966 (50 y.o. Other Clinician: Date of Birth/Sex: Female) Treating ROBSON, MICHAEL Primary Care Physician: Emily Filbert Physician/Extender: G Referring Physician: Melina Modena in Treatment: 1 Encounter Discharge Information Items Discharge Pain Level: 0 Discharge Condition: Stable Ambulatory Status: Ambulatory Discharge Destination: Home Transportation: Private Auto Accompanied By: self Schedule Follow-up Appointment: Yes Medication  Reconciliation completed and provided to Patient/Care Yes Jameah Rouser: Provided on Clinical Summary of Care: 11/29/2015 Form Type Recipient Paper Patient DB Electronic Signature(s) Signed: 11/29/2015 11:32:34 AM By: Ruthine Dose Entered By: Ruthine Dose on 11/29/2015 11:32:34 Brittany Cummings ([id number]) -------------------------------------------------------------------------------- Lower Extremity Assessment Details Patient Name: Brittany Decamp E. Date of Service: 11/29/2015 11:00 AM Medical Record Patient Account Number: [id number] Number: Treating RN: Ahmed Prima 11-30-1965 (50 y.o. Other Clinician: Date of Birth/Sex: Female) Treating ROBSON, MICHAEL Primary Care Physician: Emily Filbert Physician/Extender: G Referring Physician: Melina Modena in Treatment: 1 Electronic Signature(s) Signed: 11/29/2015 4:05:48 PM By: Alric Quan Entered By: Alric Quan on 11/29/2015 11:04:38 [place], [place]. ([id number]) -------------------------------------------------------------------------------- Multi Wound Chart Details Patient Name: Brittany Decamp E. Date of Service: 11/29/2015 11:00 AM Medical Record Patient Account Number: [id number] Number: Treating RN: Ahmed Prima 02/14/1966 (50 y.o. Other Clinician: Date of Birth/Sex: Female) Treating ROBSON, [place] Primary Care Physician: Emily Filbert Physician/Extender: G Referring Physician: Melina Modena in Treatment: 1 Vital Signs Height(in): 63 Pulse(bpm): 96 Weight(lbs): 176 Blood Pressure 154/76 (mmHg): Body Mass Index(BMI): 31 Temperature(F): 98.2 Respiratory Rate 18 (breaths/min): Photos: [1:No Photos] [N/A:N/A] Wound Location: [1:Back - Midline] [N/A:N/A] Wounding Event: [1:Surgical Injury] [N/A:N/A] Primary Etiology: [1:Open Surgical Wound] [N/A:N/A] Comorbid History: [1:Cataracts, Hypertension, N/A Type II Diabetes, Osteoarthritis, Neuropathy] Date Acquired: [1:09/28/2015]  [N/A:N/A] Weeks of Treatment: [1:1] [N/A:N/A] Wound Status: [1:Open] [N/A:N/A] Measurements L x W x D 1.5x0.4x0.1 [N/A:N/A] (cm) Area (cm) : [1:0.471] [N/A:N/A] Volume (cm) : [1:0.047] [N/A:N/A] % Reduction in Area: [1:-19.80%] [N/A:N/A] % Reduction in Volume: 40.50% [N/A:N/A] Classification: [1:Full Thickness Without Exposed Support Structures] [N/A:N/A] Exudate Amount: [1:Small] [N/A:N/A] Exudate Type: [1:Serous] [N/A:N/A] Exudate Color: [1:amber] [N/A:N/A] Wound Margin: [1:Distinct, outline attached N/A] Granulation Amount: [1:Large (67-100%)] [N/A:N/A] Necrotic Amount: [1:Small (1-33%)] [N/A:N/A] Exposed Structures: [1:Fascia: No Fat: No Tendon: No] [N/A:N/A] Muscle: No Joint: No Bone: No Limited to Skin Breakdown Epithelialization: Small (1-33%) N/A N/A Periwound Skin Texture: Edema: No N/A N/A Excoriation: No Induration: No Callus: No Crepitus: No Fluctuance: No Friable: No Rash: No Scarring:  No Periwound Skin Moist: Yes N/A N/A Moisture: Maceration: No Dry/Scaly: No Periwound Skin Color: Atrophie Blanche: No N/A N/A Cyanosis: No Ecchymosis: No Erythema: No Hemosiderin Staining: No Mottled: No Pallor: No Rubor: No Temperature: No Abnormality N/A N/A Tenderness on Yes N/A N/A Palpation: Wound Preparation: Ulcer Cleansing: N/A N/A Rinsed/Irrigated with Saline Topical Anesthetic Applied: Other: lidocaine 4% Treatment Notes Electronic Signature(s) Signed: 11/29/2015 4:05:48 PM By: Alric Quan Entered By: Alric Quan on 11/29/2015 11:07:27 Brittany Cummings ([id number]) -------------------------------------------------------------------------------- [place] Details Patient Name: Brittany Cummings, Brittany Cummings. Date of Service: 11/29/2015 11:00 AM Medical Record Patient Account Number: [id number] Number: Treating RN: Ahmed Prima [birth year] (50 y.o. Other Clinician: Date of Birth/Sex: Female) Treating ROBSON,  [place] Primary Care Physician: Emily Filbert Physician/Extender: G Referring Physician: Melina Modena in Treatment: 1 Active Inactive Orientation to the Wound Care Program Nursing Diagnoses: Knowledge deficit related to the wound healing center program Goals: Patient/caregiver will verbalize understanding of the [place] Program Date Initiated: 11/22/2015 Goal Status: Active Interventions: Provide education on orientation to the wound center Notes: Wound/Skin Impairment Nursing Diagnoses: Impaired tissue integrity Knowledge deficit related to smoking impact on wound healing Knowledge deficit related to ulceration/compromised skin integrity Goals: Patient/caregiver will verbalize understanding of skin care regimen Date Initiated: 11/22/2015 Goal Status: Active Ulcer/skin breakdown will have a volume reduction of 30% by week 4 Date Initiated: 11/22/2015 Goal Status: Active Ulcer/skin breakdown will have a volume reduction of 50% by week 8 Date Initiated: 11/22/2015 Goal Status: Active Ulcer/skin breakdown will have a volume reduction of 80% by week 12 Date Initiated: 11/22/2015 Goal Status: Active KASSI, [place] ([id number]) Ulcer/skin breakdown will heal within 14 weeks Date Initiated: 11/22/2015 Goal Status: Active Interventions: Assess patient/caregiver ability to obtain necessary supplies Assess patient/caregiver ability to perform ulcer/skin care regimen upon admission and as needed Assess ulceration(s) every visit Provide education on smoking Provide education on ulcer and skin care Notes: Electronic Signature(s) Signed: 11/29/2015 4:05:48 PM By: Alric Quan Entered By: Alric Quan on 11/29/2015 11:07:21 Brittany Cummings, Brittany E. ([id number]) -------------------------------------------------------------------------------- Pain Assessment Details Patient Name: Brittany Decamp E. Date of Service: 11/29/2015 11:00 AM Medical Record Patient Account Number:  [id number] Number: Treating RN: Ahmed Prima [birth year] (49 y.o. Other Clinician: Date of Birth/Sex: Female) Treating ROBSON, MICHAEL Primary Care Physician: Emily Filbert Physician/Extender: G Referring Physician: Melina Modena in Treatment: 1 Active Problems Location of Pain Severity and Description of Pain Patient Has Paino Yes Site Locations Pain Location: Generalized Pain Duration of the Pain. Constant / Intermittento Constant Rate the pain. Current Pain Level: 9 Character of Pain Describe the Pain: Aching Pain Management and Medication Current Pain Management: Notes back pain r/t spinal fusion Electronic Signature(s) Signed: 11/29/2015 4:05:48 PM By: Alric Quan Entered By: Alric Quan on 11/29/2015 11:01:51 Brittany Cummings ([id number]) -------------------------------------------------------------------------------- Patient/Caregiver Education Details Patient Name: Brittany Cummings. Date of Service: 11/29/2015 11:00 AM Medical Record Patient Account Number: [id number] Number: Treating RN: Ahmed Prima 1966-06-19 (49 y.o. Other Clinician: Date of Birth/Gender: Female) Treating ROBSON, [place] Primary Care Physician: Emily Filbert Physician/Extender: G Referring Physician: Melina Modena in Treatment: 1 Education Assessment Education Provided To: Patient Education Topics Provided Wound/Skin Impairment: Handouts: Other: change dressing as ordered Methods: Demonstration, Explain/Verbal Responses: State content correctly Electronic Signature(s) Signed: 11/29/2015 4:05:48 PM By: Alric Quan Entered By: Alric Quan on 11/29/2015 11:28:53 Brittany Cummings, Brittany E. ([id number]) -------------------------------------------------------------------------------- Wound Assessment Details Patient Name: Brittany Decamp E. Date of Service: 11/29/2015 11:00 AM Medical Record Patient Account Number: [id number]  [name] Number: Treating  RN: Ahmed Prima 1965/11/28 (49 y.o. Other Clinician: Date of Birth/Sex: Female) Treating ROBSON, MICHAEL Primary Care Physician: Emily Filbert Physician/Extender: G Referring Physician: Melina Modena in Treatment: 1 Wound Status Wound Number: 1 Primary Open Surgical Wound Etiology: Wound Location: Back - Midline Wound Open Wounding Event: Surgical Injury Status: Date Acquired: 09/28/2015 Comorbid Cataracts, Hypertension, Type II Weeks Of Treatment: 1 History: Diabetes, Osteoarthritis, Neuropathy Clustered Wound: No Photos Photo Uploaded By: Alric Quan on 11/29/2015 15:24:29 Wound Measurements Length: (cm) 1.5 Width: (cm) 0.4 Depth: (cm) 0.1 Area: (cm) 0.471 Volume: (cm) 0.047 % Reduction in Area: -19.8% % Reduction in Volume: 40.5% Epithelialization: Small (1-33%) Tunneling: No Undermining: No Wound Description Full Thickness Without Exposed Foul Odor Afte Classification: Support Structures Wound Margin: Distinct, outline attached Exudate Small Amount: Exudate Type: Serous Exudate Color: amber r Cleansing: No Wound Bed Granulation Amount: Large (67-100%) Exposed Structure [place], [place] E. ([name]) Necrotic Amount: Small (1-33%) Fascia Exposed: No Necrotic Quality: Adherent Slough Fat Layer Exposed: No Tendon Exposed: No Muscle Exposed: No Joint Exposed: No Bone Exposed: No Limited to Skin Breakdown Periwound Skin Texture Texture Color No Abnormalities Noted: No No Abnormalities Noted: No Callus: No Atrophie Blanche: No Crepitus: No Cyanosis: No Excoriation: No Ecchymosis: No Fluctuance: No Erythema: No Friable: No Hemosiderin Staining: No Induration: No Mottled: No Localized Edema: No Pallor: No Rash: No Rubor: No Scarring: No Temperature / Pain Moisture Temperature: No Abnormality No Abnormalities Noted: No Tenderness on Palpation: Yes Dry / Scaly: No Maceration: No Moist: Yes Wound Preparation Ulcer Cleansing:  Rinsed/Irrigated with Saline Topical Anesthetic Applied: Other: lidocaine 4%, Treatment Notes Wound #1 (Midline Back) 1. Cleansed with: Clean wound with Normal Saline 2. Anesthetic Topical Lidocaine 4% cream to wound bed prior to debridement 4. Dressing Applied: Prisma Ag 5. Secondary [place] Signature(s) Signed: 11/29/2015 4:05:48 PM By: Alric Quan Entered By: Alric Quan on 11/29/2015 11:06:49 Brittany Cummings ([name]) -------------------------------------------------------------------------------- [place] Details Patient Name: Brittany Cummings. Date of Service: 11/29/2015 11:00 AM Medical Record Patient Account Number: [name] Number: Treating RN: Ahmed Prima 12/02/1965 (49 y.o. Other Clinician: Date of Birth/Sex: Female) Treating ROBSON, MICHAEL Primary Care Physician: Emily Filbert Physician/Extender: G Referring Physician: Melina Modena in Treatment: 1 Vital Signs Time Taken: 11:03 Temperature (F): 98.2 Height (in): 63 Pulse (bpm): 96 Weight (lbs): 176 Respiratory Rate (breaths/min): 18 Body Mass Index (BMI): 31.2 Blood Pressure (mmHg): 154/76 Reference Range: 80 - 120 mg / dl Electronic Signature(s) Signed: 11/29/2015 4:05:48 PM By: Alric Quan Entered By: Alric Quan on 11/29/2015 11:04:10

## 2015-12-06 ENCOUNTER — Encounter: Payer: Medicare Other | Admitting: Internal Medicine

## 2015-12-06 DIAGNOSIS — T8131XA Disruption of external operation (surgical) wound, not elsewhere classified, initial encounter: Secondary | ICD-10-CM | POA: Diagnosis not present

## 2015-12-07 NOTE — Progress Notes (Signed)
Brittany Cummings ([name]) Visit Report for 12/06/2015 Chief Complaint Document Details Patient Name: Brittany Cummings, MANIS. Date of Service: 12/06/2015 8:00 AM Medical Record Patient Account Number: [name] Number: Treating RN: Ahmed Prima [birth year] (49 y.o. Other Clinician: Date of Birth/Sex: Female) Treating ROBSON, MICHAEL Primary Care Physician/Extender: Claudette Laws Physician: Referring Physician: Melina Modena in Treatment: 2 Information Obtained from: Patient Chief Complaint Patient is here for review of a nonhealing wound on a lumbar surgical site from bilateral decompression and fusion surgery she had on 09/15/15 Electronic Signature(s) Signed: 12/07/2015 8:05:15 AM By: Linton Ham MD Entered By: Linton Ham on 12/06/2015 09:10:29 Brittany Cummings ([name]) -------------------------------------------------------------------------------- Debridement Details Patient Name: Brittany Decamp E. Date of Service: 12/06/2015 8:00 AM Medical Record Patient Account Number: [name] Number: Treating RN: Ahmed Prima 17-Apr-1966 (49 y.o. Other Clinician: Date of Birth/Sex: Female) Treating ROBSON, MICHAEL Primary Care Physician/Extender: Claudette Laws Physician: Referring Physician: Melina Modena in Treatment: 2 Debridement Performed for Wound #1 Midline Back Assessment: Performed By: Physician Ricard Dillon, MD Debridement: Debridement Pre-procedure Yes Verification/Time Out Taken: Start Time: 08:32 Pain Control: Other : lidocaine 4% cream Level: Skin/Subcutaneous Tissue Total Area Debrided (L x 0.5 (cm) x 0.3 (cm) = 0.15 (cm) W): Tissue and other Viable, Non-Viable, Exudate, Fibrin/Slough, Subcutaneous material debrided: Instrument: Blade Bleeding: Minimum Hemostasis Achieved: Pressure End Time: 08:33 Procedural Pain: 0 Post Procedural Pain: 0 Response to Treatment: Procedure was tolerated well Post Debridement  Measurements of Total Wound Length: (cm) 0.1 Width: (cm) 0.1 Depth: (cm) 0.1 Volume: (cm) 0.001 Post Procedure Diagnosis Same as Pre-procedure Electronic Signature(s) Signed: 12/06/2015 5:43:24 PM By: Alric Quan Signed: 12/07/2015 8:05:15 AM By: Linton Ham MD Entered By: Linton Ham on 12/06/2015 09:10:17 Brittany Cummings ([name]) Brittany Cummings, Brittany E. ([name]) -------------------------------------------------------------------------------- HPI Details Patient Name: Brittany Decamp E. Date of Service: 12/06/2015 8:00 AM Medical Record Patient Account Number: [name] Number: Treating RN: Ahmed Prima Sep 13, 1966 (49 y.o. Other Clinician: Date of Birth/Sex: Female) Treating ROBSON, MICHAEL Primary Care Physician/Extender: Claudette Laws Physician: Referring Physician: Melina Modena in Treatment: 2 History of Present Illness HPI Description: 11/22/15; this is a 50 year old woman who had back pain radiating into her bilateral legs up. 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 She underwent a bilateral decompression and fusion on 09/15/15. The surgery and immediate postoperative period were apparently uneventful. 2 weeks later she started to develop pain and swelling on the right side of her wound. She was seen by her surgeon Dr. Hal Neer. She apparently had an MRI that showed a hematoma collection although I don't see this result in [facility] [facility]. Dr. Hal Neer gave her Keflex for 10 days. She in the same time frame saw her primary physician who saw a scab on the wound and applied topical antibiotic creams. She is here for our review of the surgical site wound. The patient is a diabetic on oral agents and insulin but no prior wound issues. 11/29/15; patient arrives back today. She has been applying Prisma hydrogel with a foam cover, her husband has been changing this. She is still complaining of back pain but has had no wound  issues. 12/06/15; the patient arrived back today with some suggestion the area on her surgical incision and healed however with debridement of some surface eschar and nonviable subcutaneous tissue to the area is not healed but there is a very tiny open area. She states her back pain is much better. She denies any constitutional  symptoms Electronic Signature(s) Signed: 12/07/2015 8:05:15 AM By: Linton Ham MD Entered By: Linton Ham on 12/06/2015 [name], [name] ([id number]) -------------------------------------------------------------------------------- Physical Exam Details Patient Name: Brittany Cummings, Brittany E. Date of Service: 12/06/2015 8:00 AM Medical Record Patient Account Number: [id number] Number: Treating RN: Ahmed Prima [birth year] (49 y.o. Other Clinician: Date of Birth/Sex: Female) Treating ROBSON, MICHAEL Primary Care Physician/Extender: Claudette Laws Physician: Referring Physician: Melina Modena in Treatment: 2 Notes Wound exam; the original open area superiorly is healed. Roughly 2/3 of an inch down from the cyst. Her aspect of the incision was a small area of surface eschar. This was divided there was some nonviable subcutaneous tissue probably also some dry dressing which was removed she has a very tiny open area. Hopefully this will be resolved next week Electronic Signature(s) Signed: 12/07/2015 8:05:15 AM By: Linton Ham MD Entered By: Linton Ham on 12/06/2015 09:12:06 Brittany Cummings ([id number]) -------------------------------------------------------------------------------- Physician Orders Details Patient Name: Brittany Cummings. Date of Service: 12/06/2015 8:00 AM Medical Record Patient Account Number: [id number] Number: Treating RN: Ahmed Prima [birth year] (49 y.o. Other Clinician: Date of Birth/Sex: Female) Treating ROBSON, MICHAEL Primary Care Physician/Extender: Claudette Laws Physician: Referring  Physician: Melina Modena in Treatment: 2 Verbal / Phone Orders: Yes Clinician: Carolyne Fiscal, Debi Read Back and Verified: Yes Diagnosis Coding Wound Cleansing Wound #1 Midline Back o Clean wound with Normal Saline. Anesthetic Wound #1 Midline Back o Topical Lidocaine 4% cream applied to wound bed prior to debridement Primary Wound Dressing Wound #1 Midline Back o Prisma Ag Dressing Change Frequency Wound #1 Midline Back o Change dressing every other day. Follow-up Appointments Wound #1 Midline Back o Return Appointment in 1 week. Additional Orders / Instructions Wound #1 Midline Back o Stop Smoking o Increase protein intake. o Activity as tolerated Notes telfa island secondary dressing Electronic Signature(s) Signed: 12/06/2015 5:43:24 PM By: Alric Quan Signed: 12/07/2015 8:05:15 AM By: Linton Ham MD Brittany Cummings, Brittany Cummings ([id number]) Entered By: Alric Quan on 12/06/2015 08:38:56 Brittany Cummings ([id number]) -------------------------------------------------------------------------------- Problem List Details Patient Name: Brittany Cummings, CAZIER. Date of Service: 12/06/2015 8:00 AM Medical Record Patient Account Number: [id number] Number: Treating RN: Ahmed Prima [birth year] (49 y.o. Other Clinician: Date of Birth/Sex: Female) Treating ROBSON, MICHAEL Primary Care Physician/Extender: Claudette Laws Physician: Referring Physician: Melina Modena in Treatment: 2 Active Problems ICD-10 Encounter Code Description Active Date Diagnosis T81.31XA Disruption of external operation (surgical) wound, not 11/22/2015 Yes elsewhere classified, initial encounter M43.16 Spondylolisthesis, lumbar region 11/22/2015 Yes Inactive Problems Resolved Problems Electronic Signature(s) Signed: 12/07/2015 8:05:15 AM By: Linton Ham MD Entered By: Linton Ham on 12/06/2015 09:09:45 [place] [facility], Brittany Cummings  ([id number]) -------------------------------------------------------------------------------- Progress Note Details Patient Name: Brittany Decamp E. Date of Service: 12/06/2015 8:00 AM Medical Record Patient Account Number: [id number] Number: Treating RN: Ahmed Prima Oct 04, 1966 (49 y.o. Other Clinician: Date of Birth/Sex: Female) Treating ROBSON, MICHAEL Primary Care Physician/Extender: Claudette Laws Physician: Referring Physician: Melina Modena in Treatment: 2 Subjective Chief Complaint Information obtained from Patient Patient is here for review of a nonhealing wound on a lumbar surgical site from bilateral decompression and fusion surgery she had on 09/15/15 History of Present Illness (HPI) 11/22/15; this is a 50 year old woman who had back pain radiating into her bilateral legs up. 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 She underwent a bilateral decompression and fusion on 09/15/15. The surgery and immediate postoperative period were apparently uneventful. 2 weeks later she started to develop  pain and swelling on the right side of her wound. She was seen by her surgeon Dr. Hal Neer. She apparently had an MRI that showed a hematoma collection although I don't see this result in [facility] [facility]. Dr. Hal Neer gave her Keflex for 10 days. She in the same time frame saw her primary physician who saw a scab on the wound and applied topical antibiotic creams. She is here for our review of the surgical site wound. The patient is a diabetic on oral agents and insulin but no prior wound issues. 11/29/15; patient arrives back today. She has been applying Prisma hydrogel with a foam cover, her husband has been changing this. She is still complaining of back pain but has had no wound issues. 12/06/15; the patient arrived back today with some suggestion the area on her surgical incision and healed however with debridement of some surface eschar and  nonviable subcutaneous tissue to the area is not healed but there is a very tiny open area. She states her back pain is much better. She denies any constitutional symptoms Objective Constitutional Vitals Time Taken: 8:11 AM, Height: 63 in, Weight: 176 lbs, BMI: 31.2, Temperature: 98.3 F, Pulse: 105 bpm, Respiratory Rate: 18 breaths/min, Blood Pressure: 126/77 mmHg. Integumentary (Hair, Skin) Brittany Cummings, Brittany E. ([id number]) Wound #1 status is Open. Original cause of wound was Surgical Injury. The wound is located on the Midline Back. The wound measures 0.5cm length x 0.3cm width x 0.1cm depth; 0.118cm^2 area and 0.012cm^3 volume. The wound is limited to skin breakdown. There is no tunneling or undermining noted. There is a small amount of serous drainage noted. The wound margin is distinct with the outline attached to the wound base. There is no granulation within the wound bed. There is a large (67-100%) amount of necrotic tissue within the wound bed including Eschar. The periwound skin appearance exhibited: Dry/Scaly. The periwound skin appearance did not exhibit: Callus, Crepitus, Excoriation, Fluctuance, Friable, Induration, Localized Edema, Rash, Scarring, Maceration, Moist, Atrophie Blanche, Cyanosis, Ecchymosis, Hemosiderin Staining, Mottled, Pallor, Rubor, Erythema. Periwound temperature was noted as No Abnormality. The periwound has tenderness on palpation. Assessment Active Problems ICD-10 T81.31XA - Disruption of external operation (surgical) wound, not elsewhere classified, initial encounter M43.16 - Spondylolisthesis, lumbar region Procedures Wound #1 Wound #1 is an Open Surgical Wound located on the Midline Back . There was a Skin/Subcutaneous Tissue Debridement [id number]) debridement with total area of 0.15 sq cm performed by Ricard Dillon, MD. with the following instrument(s): Blade to remove Viable and Non-Viable tissue/material including Exudate, Fibrin/Slough, and  Subcutaneous after achieving pain control using Other (lidocaine 4% cream). A time out was conducted prior to the start of the procedure. A Minimum amount of bleeding was controlled with Pressure. The procedure was tolerated well with a pain level of 0 throughout and a pain level of 0 following the procedure. Post Debridement Measurements: 0.1cm length x 0.1cm width x 0.1cm depth; 0.001cm^3 volume. Post procedure Diagnosis Wound #1: Same as Pre-Procedure Plan Wound Cleansing: Wound #1 Midline Back: Brittany Cummings, Brittany E. ([id number]) Clean wound with Normal Saline. Anesthetic: Wound #1 Midline Back: Topical Lidocaine 4% cream applied to wound bed prior to debridement Primary Wound Dressing: Wound #1 Midline Back: Prisma Ag Dressing Change Frequency: Wound #1 Midline Back: Change dressing every other day. Follow-up Appointments: Wound #1 Midline Back: Return Appointment in 1 week. Additional Orders / Instructions: Wound #1 Midline Back: Stop Smoking Increase protein intake. Activity as tolerated General Notes: [place] secondary dressing #1 continue with  the Prisma hydrogel foam-based dressing and review in one week Electronic Signature(s) Signed: 12/07/2015 8:05:15 AM By: Linton Ham MD Entered By: Linton Ham on 12/06/2015 09:12:31 Brittany Cummings, Brittany Cummings ([name]) -------------------------------------------------------------------------------- SuperBill Details Patient Name: Brittany Decamp E. Date of Service: 12/06/2015 Medical Record Patient Account Number: [name] Number: Treating RN: Ahmed Prima 09/04/1966 (49 y.o. Other Clinician: Date of Birth/Sex: Female) Treating ROBSON, MICHAEL Primary Care Physician/Extender: Claudette Laws Physician: Suella [place] in Treatment: 2 Referring Physician: Emily Filbert Diagnosis Coding ICD-10 Codes Code Description Disruption of external operation (surgical) wound, not elsewhere classified,  initial T81.31XA encounter M43.16 Spondylolisthesis, lumbar region Facility Procedures CPT4: Description Modifier Quantity Code [id number] - DEB SUBQ TISSUE 20 SQ CM/< 1 ICD-10 Description Diagnosis T81.31XA Disruption of external operation (surgical) wound, not elsewhere classified, initial encounter Physician Procedures CPT4: Description Modifier Quantity Code [id number] - WC PHYS SUBQ TISS 20 SQ CM 1 ICD-10 Description Diagnosis T81.31XA Disruption of external operation (surgical) wound, not elsewhere classified, initial encounter Electronic Signature(s) Signed: 12/07/2015 8:05:15 AM By: Linton Ham MD Entered By: Linton Ham on 12/06/2015 09:13:09

## 2015-12-07 NOTE — Progress Notes (Signed)
Brittany Cummings ([id number]) Visit Report for 12/06/2015 Arrival Information Details Patient Name: Brittany Cummings, Brittany Cummings. Date of Service: 12/06/2015 8:00 AM Medical Record Patient Account Number: [id number] Number: Treating RN: Ahmed Prima [birth year] (49 y.o. Other Clinician: Date of Birth/Sex: Female) Treating ROBSON, [place] Primary Care Physician: Emily Filbert Physician/Extender: G Referring Physician: Melina Modena in Treatment: 2 Visit Information History Since Last Visit All ordered tests and consults were completed: No Patient Arrived: Ambulatory Added or deleted any medications: No Arrival Time: 08:07 Any new allergies or adverse reactions: No Accompanied By: self Had a fall or experienced change in No Transfer Assistance: None activities of daily living that may affect Patient Identification Verified: Yes risk of falls: Secondary Verification Process Yes Signs or symptoms of abuse/neglect since last No Completed: visito Patient Requires Transmission-Based No Hospitalized since last visit: No Precautions: Pain Present Now: Yes Patient Has Alerts: No Electronic Signature(s) Signed: 12/06/2015 5:43:24 PM By: Alric Quan Entered By: Alric Quan on 12/06/2015 08:07:56 Gehring, Riley Lam ([id number]) -------------------------------------------------------------------------------- Encounter Discharge Information Details Patient Name: Brittany Cummings. Date of Service: 12/06/2015 8:00 AM Medical Record Patient Account Number: [id number] Number: Treating RN: Ahmed Prima [birth year] (49 y.o. Other Clinician: Date of Birth/Sex: Female) Treating ROBSON, MICHAEL Primary Care Physician: Emily Filbert Physician/Extender: G Referring Physician: Melina Modena in Treatment: 2 Encounter Discharge Information Items Discharge Pain Level: 0 Discharge Condition: Stable Ambulatory Status: Ambulatory Discharge Destination: Home Transportation:  Private Auto Accompanied By: self Schedule Follow-up Appointment: Yes Medication Reconciliation completed and provided to Patient/Care Yes Darryll Raju: Provided on Clinical Summary of Care: 12/13/2015 Form Type Recipient Paper Patient db Electronic Signature(s) Signed: 12/06/2015 8:41:47 AM By: Sharon Mt Entered By: Sharon Mt on 12/06/2015 08:41:47 Apostol, Sumedha E. ([id number]) -------------------------------------------------------------------------------- Lower Extremity Assessment Details Patient Name: Brittany Decamp E. Date of Service: 12/06/2015 8:00 AM Medical Record Patient Account Number: [id number] Number: Treating RN: Ahmed Prima 06/25/1966 (49 y.o. Other Clinician: Date of Birth/Sex: Female) Treating ROBSON, MICHAEL Primary Care Physician: Emily Filbert Physician/Extender: G Referring Physician: Melina Modena in Treatment: 2 Electronic Signature(s) Signed: 12/06/2015 5:43:24 PM By: Alric Quan Entered By: Alric Quan on 12/06/2015 08:08:48 Brittany Cummings ([id number]) -------------------------------------------------------------------------------- Multi Wound Chart Details Patient Name: Brittany Decamp E. Date of Service: 12/06/2015 8:00 AM Medical Record Patient Account Number: [id number] Number: Treating RN: Ahmed Prima [birth year] (49 y.o. Other Clinician: Date of Birth/Sex: Female) Treating ROBSON, [place] Primary Care Physician: Emily Filbert Physician/Extender: G Referring Physician: Melina Modena in Treatment: 2 Vital Signs Height(in): 63 Pulse(bpm): 105 Weight(lbs): 176 Blood Pressure 126/77 (mmHg): Body Mass Index(BMI): 31 Temperature(F): 98.3 Respiratory Rate 18 (breaths/min): Photos: [1:No Photos] [N/A:N/A] Wound Location: [1:Back - Midline] [N/A:N/A] Wounding Event: [1:Surgical Injury] [N/A:N/A] Primary Etiology: [1:Open Surgical Wound] [N/A:N/A] Comorbid History: [1:Cataracts, Hypertension, N/A Type  II Diabetes, Osteoarthritis, Neuropathy] Date Acquired: [1:09/28/2015] [N/A:N/A] Weeks of Treatment: [1:2] [N/A:N/A] Wound Status: [1:Open] [N/A:N/A] Measurements L x W x D 0.5x0.3x0.1 [N/A:N/A] (cm) Area (cm) : [1:0.118] [N/A:N/A] Volume (cm) : [1:0.012] [N/A:N/A] % Reduction in Area: [1:70.00%] [N/A:N/A] % Reduction in Volume: 84.80% [N/A:N/A] Classification: [1:Full Thickness Without Exposed Support Structures] [N/A:N/A] Exudate Amount: [1:Small] [N/A:N/A] Exudate Type: [1:Serous] [N/A:N/A] Exudate Color: [1:amber] [N/A:N/A] Wound Margin: [1:Distinct, outline attached N/A] Granulation Amount: [1:None Present (0%)] [N/A:N/A] Necrotic Amount: [1:Large (67-100%)] [N/A:N/A] Necrotic Tissue: [1:Eschar] [N/A:N/A] Exposed Structures: [1:Fascia: No Fat: No] [N/A:N/A] Tendon: No Muscle: No Joint: No Bone: No Limited to Skin Breakdown Epithelialization: Small (1-33%) N/A N/A Periwound Skin Texture: Edema: No N/A N/A Excoriation:  No Induration: No Callus: No Crepitus: No Fluctuance: No Friable: No Rash: No Scarring: No Periwound Skin Dry/Scaly: Yes N/A N/A Moisture: Maceration: No Moist: No Periwound Skin Color: Atrophie Blanche: No N/A N/A Cyanosis: No Ecchymosis: No Erythema: No Hemosiderin Staining: No Mottled: No Pallor: No Rubor: No Temperature: No Abnormality N/A N/A Tenderness on Yes N/A N/A Palpation: Wound Preparation: Ulcer Cleansing: N/A N/A Rinsed/Irrigated with Saline Topical Anesthetic Applied: Other: lidocaine 4% Treatment Notes Electronic Signature(s) Signed: 12/06/2015 5:43:24 PM By: Alric Quan Entered By: Alric Quan on 12/06/2015 08:15:45 Brittany Cummings ([id number]) -------------------------------------------------------------------------------- [facility] Details Patient Name: Brittany Cummings. Date of Service: 12/06/2015 8:00 AM Medical Record Patient Account Number: [id number] Number: Treating  RN: Ahmed Prima September 18, 1966 (49 y.o. Other Clinician: Date of Birth/Sex: Female) Treating ROBSON, [place] Primary Care Physician: Emily Filbert Physician/Extender: G Referring Physician: Melina Modena in Treatment: 2 Active Inactive Orientation to the Wound Care Program Nursing Diagnoses: Knowledge deficit related to the wound healing center program Goals: Patient/caregiver will verbalize understanding of the [facility] Program Date Initiated: 11/22/2015 Goal Status: Active Interventions: Provide education on orientation to the wound center Notes: Wound/Skin Impairment Nursing Diagnoses: Impaired tissue integrity Knowledge deficit related to smoking impact on wound healing Knowledge deficit related to ulceration/compromised skin integrity Goals: Patient/caregiver will verbalize understanding of skin care regimen Date Initiated: 11/22/2015 Goal Status: Active Ulcer/skin breakdown will have a volume reduction of 30% by week 4 Date Initiated: 11/22/2015 Goal Status: Active Ulcer/skin breakdown will have a volume reduction of 50% by week 8 Date Initiated: 11/22/2015 Goal Status: Active Ulcer/skin breakdown will have a volume reduction of 80% by week 12 Date Initiated: 11/22/2015 Goal Status: Active ISHMEET, CABACUNGAN ([id number]) Ulcer/skin breakdown will heal within 14 weeks Date Initiated: 11/22/2015 Goal Status: Active Interventions: Assess patient/caregiver ability to obtain necessary supplies Assess patient/caregiver ability to perform ulcer/skin care regimen upon admission and as needed Assess ulceration(s) every visit Provide education on smoking Provide education on ulcer and skin care Notes: Electronic Signature(s) Signed: 12/06/2015 5:43:24 PM By: Alric Quan Entered By: Alric Quan on 12/06/2015 08:15:40 [place], Nicholson. ([id number]) -------------------------------------------------------------------------------- Pain Assessment Details Patient  Name: Brittany Decamp E. Date of Service: 12/06/2015 8:00 AM Medical Record Patient Account Number: [id number] Number: Treating RN: Ahmed Prima [birth year] (49 y.o. Other Clinician: Date of Birth/Sex: Female) Treating ROBSON, [place] Primary Care Physician: Emily Filbert Physician/Extender: G Referring Physician: Melina Modena in Treatment: 2 Active Problems Location of Pain Severity and Description of Pain Patient Has Paino Yes Site Locations Pain Location: Generalized Pain With Dressing Change: No Duration of the Pain. Constant / Intermittento Constant Rate the pain. Current Pain Level: 6 Worst Pain Level: 10 Least Pain Level: 6 Character of Pain Describe the Pain: Aching Pain Management and Medication Current Pain Management: Notes back pain Electronic Signature(s) Signed: 12/06/2015 5:43:24 PM By: Alric Quan Entered By: Alric Quan on 12/06/2015 08:08:35 Brittany Cummings ([id number]) -------------------------------------------------------------------------------- Patient/Caregiver Education Details Patient Name: Brittany Cummings. Date of Service: 12/06/2015 8:00 AM Medical Record Patient Account Number: [id number] Number: Treating RN: Ahmed Prima Jun 12, 1966 (49 y.o. Other Clinician: Date of Birth/Gender: Female) Treating ROBSON, [place] Primary Care Physician: Emily Filbert Physician/Extender: G Referring Physician: Melina Modena in Treatment: 2 Education Assessment Education Provided To: Patient Education Topics Provided Wound/Skin Impairment: Handouts: Other: change dressing as ordered Methods: Demonstration, Explain/Verbal Responses: State content correctly Electronic Signature(s) Signed: 12/06/2015 5:43:24 PM By: Alric Quan Entered By: Alric Quan on 12/06/2015 08:39:49 Estock, Kihanna  E. ([name]) -------------------------------------------------------------------------------- Wound Assessment  Details Patient Name: SYMIA, DETEMPLE. Date of Service: 12/06/2015 8:00 AM Medical Record Patient Account Number: [name] Number: Treating RN: Ahmed Prima [birth year] (49 y.o. Other Clinician: Date of Birth/Sex: Female) Treating ROBSON, MICHAEL Primary Care Physician: Emily Filbert Physician/Extender: G Referring Physician: Melina Modena in Treatment: 2 Wound Status Wound Number: 1 Primary Open Surgical Wound Etiology: Wound Location: Back - Midline Wound Open Wounding Event: Surgical Injury Status: Date Acquired: 09/28/2015 Comorbid Cataracts, Hypertension, Type II Weeks Of Treatment: 2 History: Diabetes, Osteoarthritis, Neuropathy Clustered Wound: No Photos Photo Uploaded By: Alric Quan on 12/06/2015 17:23:30 Wound Measurements Length: (cm) 0.5 Width: (cm) 0.3 Depth: (cm) 0.1 Area: (cm) 0.118 Volume: (cm) 0.012 % Reduction in Area: 70% % Reduction in Volume: 84.8% Epithelialization: Small (1-33%) Tunneling: No Undermining: No Wound Description Full Thickness Without Exposed Foul Odor Afte Classification: Support Structures Wound Margin: Distinct, outline attached Exudate Small Amount: Exudate Type: Serous Exudate Color: amber r Cleansing: No Wound Bed Granulation Amount: None Present (0%) Exposed Structure Miah, Markesia E. ([name]) Necrotic Amount: Large (67-100%) Fascia Exposed: No Necrotic Quality: Eschar Fat Layer Exposed: No Tendon Exposed: No Muscle Exposed: No Joint Exposed: No Bone Exposed: No Limited to Skin Breakdown Periwound Skin Texture Texture Color No Abnormalities Noted: No No Abnormalities Noted: No Callus: No Atrophie Blanche: No Crepitus: No Cyanosis: No Excoriation: No Ecchymosis: No Fluctuance: No Erythema: No Friable: No Hemosiderin Staining: No Induration: No Mottled: No Localized Edema: No Pallor: No Rash: No Rubor: No Scarring: No Temperature / Pain Moisture Temperature: No  Abnormality No Abnormalities Noted: No Tenderness on Palpation: Yes Dry / Scaly: Yes Maceration: No Moist: No Wound Preparation Ulcer Cleansing: Rinsed/Irrigated with Saline Topical Anesthetic Applied: Other: lidocaine 4%, Treatment Notes Wound #1 (Midline Back) 1. Cleansed with: Clean wound with Normal Saline 2. Anesthetic Topical Lidocaine 4% cream to wound bed prior to debridement 4. Dressing Applied: Prisma Ag 5. Secondary [place] Signature(s) Signed: 12/06/2015 5:43:24 PM By: Alric Quan Entered By: Alric Quan on 12/06/2015 08:14:40 Brittany Cummings ([name]) -------------------------------------------------------------------------------- Vitals Details Patient Name: Brittany Cummings. Date of Service: 12/06/2015 8:00 AM Medical Record Patient Account Number: [name] Number: Treating RN: Ahmed Prima 05/02/1966 (49 y.o. Other Clinician: Date of Birth/Sex: Female) Treating ROBSON, [place] Primary Care Physician: Emily Filbert Physician/Extender: G Referring Physician: Melina Modena in Treatment: 2 Vital Signs Time Taken: 08:11 Temperature (F): 98.3 Height (in): 63 Pulse (bpm): 105 Weight (lbs): 176 Respiratory Rate (breaths/min): 18 Body Mass Index (BMI): 31.2 Blood Pressure (mmHg): 126/77 Reference Range: 80 - 120 mg / dl Electronic Signature(s) Signed: 12/06/2015 5:43:24 PM By: Alric Quan Entered By: Alric Quan on 12/06/2015 08:12:13

## 2015-12-13 ENCOUNTER — Encounter: Payer: Self-pay | Admitting: Pain Medicine

## 2015-12-13 ENCOUNTER — Ambulatory Visit: Payer: Medicare Other | Attending: Pain Medicine | Admitting: Pain Medicine

## 2015-12-13 VITALS — BP 139/87 | HR 110 | Temp 98.8°F | Resp 18 | Ht 63.0 in | Wt 161.0 lb

## 2015-12-13 DIAGNOSIS — E119 Type 2 diabetes mellitus without complications: Secondary | ICD-10-CM | POA: Diagnosis not present

## 2015-12-13 DIAGNOSIS — M539 Dorsopathy, unspecified: Secondary | ICD-10-CM

## 2015-12-13 DIAGNOSIS — H409 Unspecified glaucoma: Secondary | ICD-10-CM | POA: Insufficient documentation

## 2015-12-13 DIAGNOSIS — M5417 Radiculopathy, lumbosacral region: Secondary | ICD-10-CM | POA: Diagnosis not present

## 2015-12-13 DIAGNOSIS — M25512 Pain in left shoulder: Secondary | ICD-10-CM | POA: Diagnosis not present

## 2015-12-13 DIAGNOSIS — F1190 Opioid use, unspecified, uncomplicated: Secondary | ICD-10-CM

## 2015-12-13 DIAGNOSIS — R51 Headache: Secondary | ICD-10-CM

## 2015-12-13 DIAGNOSIS — M79673 Pain in unspecified foot: Secondary | ICD-10-CM

## 2015-12-13 DIAGNOSIS — M545 Low back pain, unspecified: Secondary | ICD-10-CM

## 2015-12-13 DIAGNOSIS — G894 Chronic pain syndrome: Secondary | ICD-10-CM | POA: Diagnosis not present

## 2015-12-13 DIAGNOSIS — M170 Bilateral primary osteoarthritis of knee: Secondary | ICD-10-CM | POA: Insufficient documentation

## 2015-12-13 DIAGNOSIS — Z9884 Bariatric surgery status: Secondary | ICD-10-CM | POA: Diagnosis not present

## 2015-12-13 DIAGNOSIS — Z0189 Encounter for other specified special examinations: Secondary | ICD-10-CM | POA: Insufficient documentation

## 2015-12-13 DIAGNOSIS — F329 Major depressive disorder, single episode, unspecified: Secondary | ICD-10-CM | POA: Diagnosis not present

## 2015-12-13 DIAGNOSIS — M47816 Spondylosis without myelopathy or radiculopathy, lumbar region: Secondary | ICD-10-CM | POA: Diagnosis not present

## 2015-12-13 DIAGNOSIS — M25569 Pain in unspecified knee: Secondary | ICD-10-CM | POA: Diagnosis present

## 2015-12-13 DIAGNOSIS — M542 Cervicalgia: Secondary | ICD-10-CM

## 2015-12-13 DIAGNOSIS — Z5181 Encounter for therapeutic drug level monitoring: Secondary | ICD-10-CM | POA: Insufficient documentation

## 2015-12-13 DIAGNOSIS — M25562 Pain in left knee: Secondary | ICD-10-CM | POA: Diagnosis not present

## 2015-12-13 DIAGNOSIS — M25561 Pain in right knee: Secondary | ICD-10-CM | POA: Insufficient documentation

## 2015-12-13 DIAGNOSIS — M47896 Other spondylosis, lumbar region: Secondary | ICD-10-CM

## 2015-12-13 DIAGNOSIS — M4316 Spondylolisthesis, lumbar region: Secondary | ICD-10-CM | POA: Diagnosis not present

## 2015-12-13 DIAGNOSIS — Q762 Congenital spondylolisthesis: Secondary | ICD-10-CM

## 2015-12-13 DIAGNOSIS — M961 Postlaminectomy syndrome, not elsewhere classified: Secondary | ICD-10-CM | POA: Insufficient documentation

## 2015-12-13 DIAGNOSIS — E559 Vitamin D deficiency, unspecified: Secondary | ICD-10-CM | POA: Diagnosis not present

## 2015-12-13 DIAGNOSIS — R1013 Epigastric pain: Secondary | ICD-10-CM

## 2015-12-13 DIAGNOSIS — M5481 Occipital neuralgia: Secondary | ICD-10-CM | POA: Diagnosis not present

## 2015-12-13 DIAGNOSIS — M47892 Other spondylosis, cervical region: Secondary | ICD-10-CM | POA: Insufficient documentation

## 2015-12-13 DIAGNOSIS — M5412 Radiculopathy, cervical region: Secondary | ICD-10-CM

## 2015-12-13 DIAGNOSIS — G8929 Other chronic pain: Secondary | ICD-10-CM | POA: Diagnosis not present

## 2015-12-13 DIAGNOSIS — M25559 Pain in unspecified hip: Secondary | ICD-10-CM | POA: Insufficient documentation

## 2015-12-13 DIAGNOSIS — F17210 Nicotine dependence, cigarettes, uncomplicated: Secondary | ICD-10-CM | POA: Insufficient documentation

## 2015-12-13 DIAGNOSIS — I10 Essential (primary) hypertension: Secondary | ICD-10-CM | POA: Diagnosis not present

## 2015-12-13 DIAGNOSIS — M549 Dorsalgia, unspecified: Secondary | ICD-10-CM | POA: Diagnosis present

## 2015-12-13 DIAGNOSIS — K219 Gastro-esophageal reflux disease without esophagitis: Secondary | ICD-10-CM | POA: Insufficient documentation

## 2015-12-13 DIAGNOSIS — M4310 Spondylolisthesis, site unspecified: Secondary | ICD-10-CM

## 2015-12-13 DIAGNOSIS — D369 Benign neoplasm, unspecified site: Secondary | ICD-10-CM | POA: Insufficient documentation

## 2015-12-13 DIAGNOSIS — Z79899 Other long term (current) drug therapy: Secondary | ICD-10-CM

## 2015-12-13 DIAGNOSIS — M47812 Spondylosis without myelopathy or radiculopathy, cervical region: Secondary | ICD-10-CM

## 2015-12-13 DIAGNOSIS — M79606 Pain in leg, unspecified: Secondary | ICD-10-CM

## 2015-12-13 DIAGNOSIS — Z79891 Long term (current) use of opiate analgesic: Secondary | ICD-10-CM | POA: Insufficient documentation

## 2015-12-13 DIAGNOSIS — M5416 Radiculopathy, lumbar region: Secondary | ICD-10-CM

## 2015-12-13 DIAGNOSIS — M25511 Pain in right shoulder: Secondary | ICD-10-CM | POA: Diagnosis not present

## 2015-12-13 DIAGNOSIS — G4486 Cervicogenic headache: Secondary | ICD-10-CM

## 2015-12-13 DIAGNOSIS — G43909 Migraine, unspecified, not intractable, without status migrainosus: Secondary | ICD-10-CM | POA: Insufficient documentation

## 2015-12-13 LAB — COMPREHENSIVE METABOLIC PANEL
ALT: 12 U/L — AB (ref 14–54)
AST: 23 U/L (ref 15–41)
Albumin: 4.1 g/dL (ref 3.5–5.0)
Alkaline Phosphatase: 88 U/L (ref 38–126)
Anion gap: 9 (ref 5–15)
BILIRUBIN TOTAL: 0.4 mg/dL (ref 0.3–1.2)
BUN: 12 mg/dL (ref 6–20)
CHLORIDE: 104 mmol/L (ref 101–111)
CO2: 24 mmol/L (ref 22–32)
Calcium: 9 mg/dL (ref 8.9–10.3)
Creatinine, Ser: 0.58 mg/dL (ref 0.44–1.00)
Glucose, Bld: 183 mg/dL — ABNORMAL HIGH (ref 65–99)
Potassium: 4.4 mmol/L (ref 3.5–5.1)
Sodium: 137 mmol/L (ref 135–145)
TOTAL PROTEIN: 7.9 g/dL (ref 6.5–8.1)

## 2015-12-13 LAB — SEDIMENTATION RATE: SED RATE: 2 mm/h (ref 0–30)

## 2015-12-13 LAB — MAGNESIUM: Magnesium: 2.1 mg/dL (ref 1.7–2.4)

## 2015-12-13 NOTE — Progress Notes (Signed)
Quick Note:   Normal fasting (NPO x 8 hours) glucose levels are between 65-99 mg/dl, with 2 hour fasting, levels are usually less than 140 mg/dl. Any random blood glucose level greater than 200 mg/dl is considered to be Diabetes.  While most low ALT level results indicate a normal healthy liver, that may not always be the case. A low-functioning or non-functioning liver, lacking normal levels of ALT activity to begin with, would not release a lot of ALT into the blood when damaged. People infected with the hepatitis C virus initially show high ALT levels in their blood, but these levels fall over time. Because the ALT test measures ALT levels at only one point in time, people with chronic hepatitis C infection may already have experienced the ALT peak well before blood was drawn for the ALT test. Urinary tract infections or malnutrition may also cause low blood ALT levels. ______

## 2015-12-13 NOTE — Progress Notes (Signed)
Quick Note:  Lab results reviewed and found to be within normal limits. ______ 

## 2015-12-13 NOTE — Progress Notes (Signed)
Patient's Name: Brittany Cummings MRN: [id number] DOB: [birth year] DOS: 12/13/2015  Primary Reason(s) for Visit: Initial Patient Evaluation in this practice, but the patient was previously seen by me at [facility] CC: Back Pain; Knee Pain; Shoulder Pain; and Neck Pain   HPI  Brittany Cummings is a 50 y.o. year old, female patient, who returns today as an established patient. 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); 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); Lumbar facet hypertrophy (Severe at L4-5); Chronic neck pain (Bilateral) (L>R); Cervicogenic headache (Left); Occipital neuralgia (Left); Chronic shoulder pain (Bilateral) (R>L); Chronic cervical radicular pain (Bilateral) (R>L); Chronic low back pain (Location of Primary Source of Pain) (Bilateral) (R>L); Lumbar facet syndrome (Location of Primary Source of Pain) (Bilateral) (R>L); Chronic knee pain (Location of Secondary source of pain) (Bilateral) (R>L); Chronic lumbar radicular pain (Left); Lumbosacral radiculopathy at L4 (Left); Chronic lower extremity pain (Left); Posture arthritis of knee (Location of Secondary source of pain) (Bilateral) (R>L); Chronic foot pain (Bilateral) (L>R); and Chronic hip pain (Bilateral) (R>L) on her problem list.. Her primarily concern today is the Back Pain; Knee Pain; Shoulder Pain; and Neck Pain   The patient comes into the clinics  today for the first time after previously having been seen at [facility] where I did a bilateral celiac plexus block for her chronic abdominal pain. After that, the patient underwent an L4-5 fusion for instability of her anterolisthesis of L4 over L5. The patient comes in today indicating that she would like for [place] to take over her medications but since we had not written for them before and the patient has not seen the medical psychologist, she will be treated as a new patient. Today's visit is an evaluation only.  Pain Assessment: Self-Reported Pain Score: 7 , clinically she looks like a 3/10. Reported level is compatible with observation Pain Type: Chronic pain Pain Location: Back Pain Orientation: Lower (pain at spinal surgery incision site.  ) Pain Descriptors / Indicators: Radiating, Stabbing, Aching, Constant, Pressure Pain Frequency: Constant  Date of Last Visit: 06/09/15 Service Provided on Last Visit:  (new patient)  Controlled Substance Pharmacotherapy Assessment  Analgesic: Oxycodone IR 10 mg every 4 hours (60 mg/day) + hydrocodone/APAP 5/325 one tablet by mouth twice a day (10 mg/day) + butorphanol 10 mg per mL nasal spray. MME/day: 100 mg/day Pharmacokinetics: Onset of action (Liberation/Absorption): Within expected pharmacological parameters. (20-30 minutes for the Norco and 20 minutes for the oxycodone IR) (history of a prior gastric bypass) Time to Peak effect (Distribution): Timing and results are as within normal expected parameters. (45 minutes for the Norco and 30-40 minutes for the oxycodone IR) Duration of action (Metabolism/Excretion): Within normal limits for medication. (2.5 hours for the Norco and 3.5-4 hours for the oxycodone IR) Pharmacodynamics: Analgesic Effect: 25% for the Norco and 100% for the oxycodone IR Activity Facilitation: Medication(s) allow patient to sit, stand, walk, and do the basic ADLs Perceived Effectiveness: Described as relatively effective, allowing  for increase in activities of daily living (ADL) Side-effects or Adverse reactions: None reported Monitoring:  [place] PMP: Compliant with practice rules and regulations UDS Results/interpretation: None available at this time. Medication Assessment Form: Not applicable. Initial evaluation. The patient has not received any medications from our practice Treatment compliance: Not applicable. Initial evaluation Risk Assessment: Substance Use Disorder (SUD) Risk Level: Low Opioid Risk Tool (ORT) Score:      Depression Scale Score:    Pharmacologic Plan: Continue therapy as is  Lab Work: Illicit Drugs No results found for: THCU, COCAINSCRNUR, PCPSCRNUR, MDMA, AMPHETMU, METHADONE, ETOH  Inflammation Markers Lab Results  Component Value Date   ESRSEDRATE 2 12/13/2015    Renal Function Lab Results  Component Value Date   BUN 12 12/13/2015   CREATININE 0.58 12/13/2015   GFRAA >60 12/13/2015   GFRNONAA >60 12/13/2015    Hepatic Function Lab Results  Component Value Date   AST 23 12/13/2015   ALT 12* 12/13/2015   ALBUMIN 4.1 12/13/2015    Electrolytes Lab Results  Component Value Date   NA 137 12/13/2015   K 4.4 12/13/2015   CL 104 12/13/2015   CALCIUM 9.0 12/13/2015   MG 2.1 12/13/2015    Post-Procedure Assessment  Procedure done on last visit: Bilateral celiac plexus block Side-effects or Adverse reactions: None reported Sedation: Procedure was performed with sedation  Results:   100% relief for the duration of local anesthetic (initial one hour) Possibly the results is influenced by the pharmacodynamic effect of the local anesthetic, as well as that of the intravenous analgesics and/or sedatives, when used   100% relief for the duration of local on static (initial 4-6 hours) Short-term relief confirms injected site to be the source of pain   100% relief of the pain for the initial 2 weeks. Long-term benefit would suggest an inflammatory etiology to the pain   Current  Relief (Now):  0%  It has been since October of last year that she had the block. Interpretation of Results: The results would suggest that there is celiac plexus block is an excellent palliative alternative for her chronic abdominal pain.  Allergies  Brittany Cummings is allergic to meperidine.  Meds  The patient has a current medication list which includes the following prescription(s): amlodipine-olmesartan, azelastine, butorphanol, calcium carb-cholecalciferol, cyclobenzaprine, diclofenac sodium, duloxetine, estradiol-norethindrone, fluticasone, gabapentin, glimepiride, hydrocodone-acetaminophen, insulin nph-regular human, metoprolol succinate, multi-vitamins, oxycodone hcl, pantoprazole, quetiapine, and sucralfate.  Current Outpatient Prescriptions on File Prior to Visit  Medication Sig  . amLODipine-olmesartan (AZOR) 10-40 MG tablet Take 1 tablet by mouth daily.   Marland Kitchen azelastine (ASTELIN) 0.1 % nasal spray Place 1 spray into both nostrils 2 (two) times daily. Use in each nostril as directed  . Calcium Carb-Cholecalciferol (CALCIUM 600/VITAMIN D3) 600-800 MG-UNIT TABS Take 1 tablet by mouth daily.   . cyclobenzaprine (FLEXERIL) 10 MG tablet Take 1 tablet (10 mg total) by mouth 3 (three) times daily as needed for muscle spasms.  . DULoxetine (CYMBALTA) 60 MG capsule Take 60 mg by mouth daily.   Marland Kitchen estradiol-norethindrone (MIMVEY) 1-0.5 MG tablet Take 1 tablet by mouth daily.   . fluticasone (FLONASE) 50 MCG/ACT nasal spray Place 2 sprays into both nostrils daily.  Marland Kitchen gabapentin (NEURONTIN) 300 MG capsule Take 300 mg by mouth at bedtime.   Marland Kitchen glimepiride (AMARYL) 4 MG tablet Take 4 mg by mouth daily with breakfast.   . insulin NPH-regular Human (NOVOLIN 70/30) (70-30) 100 UNIT/ML injection Inject 40 Units into the skin 2 (two) times daily.   . metoprolol succinate (TOPROL-XL) 100 MG 24 hr tablet Take 100 mg  by mouth daily.   . Multiple Vitamin (MULTI-VITAMINS) TABS Take 1 tablet by mouth daily.   .  Oxycodone HCl 10 MG TABS Take 10 mg by mouth every 4 (four) hours as needed (for pain).   . pantoprazole (PROTONIX) 40 MG tablet Take 40 mg by mouth daily.   . QUEtiapine (SEROQUEL) 100 MG tablet Take 100 mg by mouth at bedtime.   . sucralfate (CARAFATE) 1 G tablet Take 1 g by mouth 4 (four) times daily -  with meals and at bedtime.   No current facility-administered medications on file prior to visit.    ROS  Constitutional: Afebrile, no chills, well hydrated and well nourished Gastrointestinal: Previous gastric bypass Musculoskeletal:negative Neurological: Previous L4-5 instability. Behavioral/Psych: negative  PFSH  Medical:  Brittany Cummings  has a past medical history of Spondylolisthesis; Diabetes mellitus without complication ([place]); Hypertension; Spinal headache; Depression; Seasonal allergies; Diabetic neuropathy ([place]); GERD (gastroesophageal reflux disease); Multiple gastric ulcers; Arthritis; Rib fracture; and Cervical disc disease (02/08/2014). Family: family history includes Cancer in her mother; Cancer - Colon in her father; Diabetes in her other; Hypertension in her other; Stroke in her other. Surgical:  has past surgical history that includes Gastric bypass; Back surgery; Breast reduction surgery; Appendectomy; Carpal tunnel release; Joint replacement; Knee arthroscopy; Cataract extraction; abdominal abscess excision; Diagnostic laparoscopy; Cholecystectomy; Hernia repair; Colonoscopy w/ biopsies and polypectomy; Wisdom tooth extraction; and Spinal fusion (09-15-2015). Tobacco:  reports that she has been smoking Cigarettes.  She has a 25 pack-year smoking history. She has never used smokeless tobacco. Alcohol:  reports that she does not drink alcohol. Drug:  reports that she does not use illicit drugs.  Physical Exam  Vitals:  Today's Vitals   12/13/15 1345 12/13/15 1352  BP: 139/87   Pulse: 110   Temp: 98.8 F (37.1 C)   Resp: 18   Height: 5\' 3"  (1.6 m)   Weight: 161 lb  (73.029 kg)   SpO2: 97%   PainSc: 7  7   PainLoc: Back     Calculated BMI: Body mass index is 28.53 kg/(m^2).  General appearance: alert, cooperative, appears stated age and mild distress Eyes: PERLA Respiratory: No evidence respiratory distress, no audible rales or ronchi and no use of accessory muscles of respiration  Cervical Spine Inspection: Normal anatomy Alignment: Symetrical ROM: Decreased  Upper Extremities Inspection: No gross anomalies detected ROM: Decreased Sensory: Normal Motor: Unremarkable  Thoracic Spine Inspection: No gross anomalies detected Alignment: Symetrical ROM: Adequate  Lumbar Spine Inspection: No gross anomalies detected Alignment: Symetrical ROM: Decreased  Gait: Antalgic (limping)  Lower Extremities Inspection: No gross anomalies detected ROM: Adequate Sensory:  Normal Motor: Grossly intact  Assessment & Plan  Primary Diagnosis & Pertinent Problem List: The primary encounter diagnosis was Chronic pain. 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  Visit Diagnosis: 1. Chronic pain   2. Chronic pain syndrome   3. Long term current use of opiate analgesic   4. Long term prescription opiate use   5. Opiate use   6. Encounter for therapeutic drug level monitoring   7. Encounter for pain management planning   8. Chronic abdominal pain (epigastric)   9. Cervical spondylosis   10. Lumbar spondylosis, unspecified spinal osteoarthritis   11. Avitaminosis D   12. 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 Lumbar facet hypertrophy (Severe at L4-5)   15. Chronic neck pain   16. Cervicogenic headache   17. Occipital neuralgia of left side   18. Chronic pain of both shoulders   19. Chronic radicular cervical pain   20. Chronic low back pain   21. Lumbar facet syndrome   22. Bilateral chronic knee pain   23. Chronic radicular lumbar pain   24. Lumbosacral radiculopathy at L4   25. Chronic pain of lower extremity, unspecified laterality   26. Primary osteoarthritis of both knees   27. Chronic foot pain, unspecified laterality   28. Chronic hip pain, unspecified laterality     Problem-specific Plan(s): No problem-specific assessment & plan notes found for this encounter.   Plan of Care  Pharmacotherapy (Medications Ordered): No orders of the defined types were placed in this encounter.    Lab-work & Procedure Ordered: Orders Placed This Encounter  Procedures  . MR Cervical Spine Wo Contrast    Standing Status: Future     Number of Occurrences:      Standing Expiration Date: 12/12/2016    Scheduling Instructions:     Please provide canal diameter in millimeters when describing any spinal stenosis.    Order Specific Question:  Reason for Exam (SYMPTOM  OR DIAGNOSIS REQUIRED)    Answer:  Cervical radiculitis/radiculopathy.    Order Specific Question:  Preferred imaging location?    Answer:  [facility]    Order Specific Question:   Does the patient have a pacemaker or implanted devices?    Answer:  No    Order Specific Question:  What is the patient's sedation requirement?    Answer:  No Sedation    Order Specific Question:  Call Results- Best Contact Number?    Answer:  [id number] (Pain Clinic facility) (Dr. Dossie Arbour)  . Compliance Drug Analysis, Ur    Volume: 30 ml(s). Minimum 3 ml of urine is needed. Document temperature of fresh sample. Indications: Long term (current) use of opiate analgesic (Z79.891) Test#: [id number] (Comprehensive Profile)  . Comprehensive metabolic panel    Order Specific Question:  Has the patient fasted?    Answer:  No  . C-reactive protein  . Magnesium  . Sedimentation rate  . Vitamin B12    Indication: Bone Pain (M89.9)  . Vitamin D pnl(25-hydrxy+1,25-dihy)-bld  . Ambulatory referral to Psychology    Referral Priority:  Routine    Referral Type:  Psychiatric    Referral Reason:  Specialty Services Required    Referred to Provider:  Beckey Rutter, PHD    Requested Specialty:  Psychology    Number of Visits Requested:  1  . NCV with EMG(electromyography)    Bilateral testing requested.    Standing Status: Future     Number of Occurrences:      Standing Expiration Date: 12/12/2016    Scheduling Instructions:  Please refer this patient to [facility] for Nerve Conduction testing of the lower extremities. (EMG & PNCV)    Order Specific Question:  Where should this test be performed?    Answer:  Other    Imaging Ordered: AMB REFERRAL TO PSYCHOLOGY MR CERVICAL SPINE WO CONTRAST  Interventional Therapies: Scheduled: Diagnostic bilateral lumbar facet block under fluoroscopic guidance and IV sedation. PRN Procedures:  1. Repeat bilateral celiac plexus block under fluoroscopic guidance and IV sedation. 2. Right intra-articular knee injections with local anesthetic and steroids and possible Hyalgan knee injection series. 3. Left knee genicular nerve block  with possible RFA if effective.  4. Left occipital nerve block under fluoroscopic guidance.  5. Left cervical epidural steroid injections under fluoroscopic guidance and IV sedation.   Referral(s) or Consult(s): Medical psychology consult for substance use disorder evaluation.  Medications administered during this visit: Brittany Cummings had no medications administered during this visit.  Future Appointments Date Time Provider [place]  12/14/2015 8:00 AM Ricard Dillon, MD [facility] None    Primary Care Physician: Rusty Aus., MD Location: [facility] Note by: Kathlen Brunswick. Dossie Arbour, M.D, DABA, DABAPM, DABPM, DABIPP, FIPP  Pain Score Disclaimer: We use the NRS-11 scale. This is a self-reported, subjective measurement of pain severity with only modest accuracy. It is used primarily to identify changes within a particular patient. It must be understood that outpatient pain scales are significantly less accurate that those used for research, where they can be applied under ideal controlled circumstances with minimal exposure to variables. In reality, the score is likely to be a combination of pain intensity and pain affect, where pain affect describes the degree of emotional arousal or changes in action readiness caused by the sensory experience of pain. Factors such as social and work situation, setting, emotional state, anxiety levels, expectation, and prior pain experience may influence pain perception and show large inter-individual differences that may also be affected by time variables.

## 2015-12-13 NOTE — Patient Instructions (Addendum)
Smoking Cessation, Tips for Success If you are ready to quit smoking, congratulations! You have chosen to help yourself be healthier. Cigarettes bring nicotine, tar, carbon monoxide, and other irritants into your body. Your lungs, heart, and blood vessels will be able to work better without these poisons. There are many different ways to quit smoking. Nicotine gum, nicotine patches, a nicotine inhaler, or nicotine nasal spray can help with physical craving. Hypnosis, support groups, and medicines help break the habit of smoking. WHAT THINGS CAN I DO TO MAKE QUITTING EASIER?  Here are some tips to help you quit for good: 1. Pick a date when you will quit smoking completely. Tell all of your friends and family about your plan to quit on that date. 2. Do not try to slowly cut down on the number of cigarettes you are smoking. Pick a quit date and quit smoking completely starting on that day. 3. Throw away all cigarettes.  4. Clean and remove all ashtrays from your home, work, and car. 5. On a card, write down your reasons for quitting. Carry the card with you and read it when you get the urge to smoke. 6. Cleanse your body of nicotine. Drink enough water and fluids to keep your urine clear or pale yellow. Do this after quitting to flush the nicotine from your body. 7. Learn to predict your moods. Do not let a bad situation be your excuse to have a cigarette. Some situations in your life might tempt you into wanting a cigarette. 8. Never have "just one" cigarette. It leads to wanting another and another. Remind yourself of your decision to quit. 9. Change habits associated with smoking. If you smoked while driving or when feeling stressed, try other activities to replace smoking. Stand up when drinking your coffee. Brush your teeth after eating. Sit in a different chair when you read the paper. Avoid alcohol while trying to quit, and try to drink fewer caffeinated beverages. Alcohol and caffeine may urge you  to smoke. 10. Avoid foods and drinks that can trigger a desire to smoke, such as sugary or spicy foods and alcohol. 11. Ask people who smoke not to smoke around you. 12. Have something planned to do right after eating or having a cup of coffee. For example, plan to take a walk or exercise. 13. Try a relaxation exercise to calm you down and decrease your stress. Remember, you may be tense and nervous for the first 2 weeks after you quit, but this will pass. 14. Find new activities to keep your hands busy. Play with a pen, coin, or rubber band. Doodle or draw things on paper. 15. Brush your teeth right after eating. This will help cut down on the craving for the taste of tobacco after meals. You can also try mouthwash.  16. Use oral substitutes in place of cigarettes. Try using lemon drops, carrots, cinnamon sticks, or chewing gum. Keep them handy so they are available when you have the urge to smoke. 17. When you have the urge to smoke, try deep breathing. 18. Designate your home as a nonsmoking area. 19. If you are a heavy smoker, ask your health care provider about a prescription for nicotine chewing gum. It can ease your withdrawal from nicotine. 20. Reward yourself. Set aside the cigarette money you save and buy yourself something nice. 21. Look for support from others. Join a support group or smoking cessation program. Ask someone at home or at work to help you with your plan   to quit smoking. 22. Always ask yourself, "Do I need this cigarette or is this just a reflex?" Tell yourself, "Today, I choose not to smoke," or "I do not want to smoke." You are reminding yourself of your decision to quit. 23. Do not replace cigarette smoking with electronic cigarettes (commonly called e-cigarettes). The safety of e-cigarettes is unknown, and some may contain harmful chemicals. 24. If you relapse, do not give up! Plan ahead and think about what you will do the next time you get the urge to smoke. HOW WILL  I FEEL WHEN I QUIT SMOKING? You may have symptoms of withdrawal because your body is used to nicotine (the addictive substance in cigarettes). You may crave cigarettes, be irritable, feel very hungry, cough often, get headaches, or have difficulty concentrating. The withdrawal symptoms are only temporary. They are strongest when you first quit but will go away within 10-14 days. When withdrawal symptoms occur, stay in control. Think about your reasons for quitting. Remind yourself that these are signs that your body is healing and getting used to being without cigarettes. Remember that withdrawal symptoms are easier to treat than the major diseases that smoking can cause.  Even after the withdrawal is over, expect periodic urges to smoke. However, these cravings are generally short lived and will go away whether you smoke or not. Do not smoke! WHAT RESOURCES ARE AVAILABLE TO HELP ME QUIT SMOKING? Your health care provider can direct you to community resources or hospitals for support, which may include: 1. Group support. 2. Education. 3. Hypnosis. 4. Therapy.   This information is not intended to replace advice given to you by your health care provider. Make sure you discuss any questions you have with your health care provider.   Document Released: 06/29/2004 Document Revised: 10/22/2014 Document Reviewed: 03/19/2013 Elsevier Interactive Patient Education 2016 [employer] GENERAL RISKS AND COMPLICATIONS  What are the risk, side effects and possible complications? Generally speaking, most procedures are safe.  However, with any procedure there are risks, side effects, and the possibility of complications.  The risks and complications are dependent upon the sites that are lesioned, or the type of nerve block to be performed.  The closer the procedure is to the spine, the more serious the risks are.  Great care is taken when placing the radio frequency needles, block needles or lesioning probes,  but sometimes complications can occur. 1. Infection: Any time there is an injection through the skin, there is a risk of infection.  This is why sterile conditions are used for these blocks.  There are four possible types of infection. 1. Localized skin infection. 2. Central Nervous System Infection-This can be in the form of Meningitis, which can be deadly. 3. Epidural Infections-This can be in the form of an epidural abscess, which can cause pressure inside of the spine, causing compression of the spinal cord with subsequent paralysis. This would require an emergency surgery to decompress, and there are no guarantees that the patient would recover from the paralysis. 4. Discitis-This is an infection of the intervertebral discs.  It occurs in about 1% of discography procedures.  It is difficult to treat and it may lead to surgery.        2. Pain: the needles have to go through skin and soft tissues, will cause soreness.       3. Damage to internal structures:  The nerves to be lesioned may be near blood vessels or    other nerves which can   be potentially damaged.       4. Bleeding: Bleeding is more common if the patient is taking blood thinners such as  aspirin, Coumadin, Ticiid, Plavix, etc., or if he/she have some genetic predisposition  such as hemophilia. Bleeding into the spinal canal can cause compression of the spinal  cord with subsequent paralysis.  This would require an emergency surgery to  decompress and there are no guarantees that the patient would recover from the  paralysis.       5. Pneumothorax:  Puncturing of a lung is a possibility, every time a needle is introduced in  the area of the chest or upper back.  Pneumothorax refers to free air around the  collapsed lung(s), inside of the thoracic cavity (chest cavity).  Another two possible  complications related to a similar event would include: Hemothorax and Chylothorax.   These are variations of the Pneumothorax, where instead of air  around the collapsed  lung(s), you may have blood or chyle, respectively.       6. Spinal headaches: They may occur with any procedures in the area of the spine.       7. Persistent CSF (Cerebro-Spinal Fluid) leakage: This is a rare problem, but may occur  with prolonged intrathecal or epidural catheters either due to the formation of a fistulous  track or a dural tear.       8. Nerve damage: By working so close to the spinal cord, there is always a possibility of  nerve damage, which could be as serious as a permanent spinal cord injury with  paralysis.       9. Death:  Although rare, severe deadly allergic reactions known as "Anaphylactic  reaction" can occur to any of the medications used.      10. Worsening of the symptoms:  We can always make thing worse.  What are the chances of something like this happening? Chances of any of this occuring are extremely low.  By statistics, you have more of a chance of getting killed in a motor vehicle accident: while driving to the hospital than any of the above occurring .  Nevertheless, you should be aware that they are possibilities.  In general, it is similar to taking a shower.  Everybody knows that you can slip, hit your head and get killed.  Does that mean that you should not shower again?  Nevertheless always keep in mind that statistics do not mean anything if you happen to be on the wrong side of them.  Even if a procedure has a 1 (one) in a 1,000,000 (million) chance of going wrong, it you happen to be that one..Also, keep in mind that by statistics, you have more of a chance of having something go wrong when taking medications.  Who should not have this procedure? If you are on a blood thinning medication (e.g. Coumadin, Plavix, see list of "Blood Thinners"), or if you have an active infection going on, you should not have the procedure.  If you are taking any blood thinners, please inform your physician.  How should I prepare for this  procedure?  Do not eat or drink anything at least six hours prior to the procedure.  Bring a driver with you .  It cannot be a taxi.  Come accompanied by an adult that can drive you back, and that is strong enough to help you if your legs get weak or numb from the local anesthetic.  Take all of your medicines   the morning of the procedure with just enough water to swallow them.  If you have diabetes, make sure that you are scheduled to have your procedure done first thing in the morning, whenever possible.  If you have diabetes, take only half of your insulin dose and notify our nurse that you have done so as soon as you arrive at the clinic.  If you are diabetic, but only take blood sugar pills (oral hypoglycemic), then do not take them on the morning of your procedure.  You may take them after you have had the procedure.  Do not take aspirin or any aspirin-containing medications, at least eleven (11) days prior to the procedure.  They may prolong bleeding.  Wear loose fitting clothing that may be easy to take off and that you would not mind if it got stained with Betadine or blood.  Do not wear any jewelry or perfume  Remove any nail coloring.  It will interfere with some of our monitoring equipment.  NOTE: Remember that this is not meant to be interpreted as a complete list of all possible complications.  Unforeseen problems may occur.  BLOOD THINNERS The following drugs contain aspirin or other products, which can cause increased bleeding during surgery and should not be taken for 2 weeks prior to and 1 week after surgery.  If you should need take something for relief of minor pain, you may take acetaminophen which is found in Tylenol,m Datril, Anacin-3 and Panadol. It is not blood thinner. The products listed below are.  Do not take any of the products listed below in addition to any listed on your instruction sheet.  A.P.C or A.P.C with Codeine Codeine Phosphate Capsules #3  Ibuprofen Ridaura  ABC compound Congesprin Imuran rimadil  Advil Cope Indocin Robaxisal  Alka-Seltzer Effervescent Pain Reliever and Antacid Coricidin or Coricidin-D  Indomethacin Rufen  Alka-Seltzer plus Cold Medicine Cosprin Ketoprofen S-A-C Tablets  Anacin Analgesic Tablets or Capsules Coumadin Korlgesic Salflex  Anacin Extra Strength Analgesic tablets or capsules CP-2 Tablets Lanoril Salicylate  Anaprox Cuprimine Capsules Levenox Salocol  Anexsia-D Dalteparin Magan Salsalate  Anodynos Darvon compound Magnesium Salicylate Sine-off  Ansaid Dasin Capsules Magsal Sodium Salicylate  Anturane Depen Capsules Marnal Soma  APF Arthritis pain formula Dewitt's Pills Measurin Stanback  Argesic Dia-Gesic Meclofenamic Sulfinpyrazone  Arthritis Bayer Timed Release Aspirin Diclofenac Meclomen Sulindac  Arthritis pain formula Anacin Dicumarol Medipren Supac  Analgesic (Safety coated) Arthralgen Diffunasal Mefanamic Suprofen  Arthritis Strength Bufferin Dihydrocodeine Mepro Compound Suprol  Arthropan liquid Dopirydamole Methcarbomol with Aspirin Synalgos  ASA tablets/Enseals Disalcid Micrainin Tagament  Ascriptin Doan's Midol Talwin  Ascriptin A/D Dolene Mobidin Tanderil  Ascriptin Extra Strength Dolobid Moblgesic Ticlid  Ascriptin with Codeine Doloprin or Doloprin with Codeine Momentum Tolectin  Asperbuf Duoprin Mono-gesic Trendar  Aspergum Duradyne Motrin or Motrin IB Triminicin  Aspirin plain, buffered or enteric coated Durasal Myochrisine Trigesic  Aspirin Suppositories Easprin Nalfon Trillsate  Aspirin with Codeine Ecotrin Regular or Extra Strength Naprosyn Uracel  Atromid-S Efficin Naproxen Ursinus  Auranofin Capsules Elmiron Neocylate Vanquish  Axotal Emagrin Norgesic Verin  Azathioprine Empirin or Empirin with Codeine Normiflo Vitamin E  Azolid Emprazil Nuprin Voltaren  Bayer Aspirin plain, buffered or children's or timed BC Tablets or powders Encaprin Orgaran Warfarin Sodium   Buff-a-Comp Enoxaparin Orudis Zorpin  Buff-a-Comp with Codeine Equegesic Os-Cal-Gesic   Buffaprin Excedrin plain, buffered or Extra Strength Oxalid   Bufferin Arthritis Strength Feldene Oxphenbutazone   Bufferin plain or Extra Strength Feldene Capsules Oxycodone with Aspirin     Bufferin with Codeine Fenoprofen Fenoprofen Pabalate or Pabalate-SF   Buffets II Flogesic Panagesic   Buffinol plain or Extra Strength Florinal or Florinal with Codeine Panwarfarin   Buf-Tabs Flurbiprofen Penicillamine   Butalbital Compound Four-way cold tablets Penicillin   Butazolidin Fragmin Pepto-Bismol   Carbenicillin Geminisyn Percodan   Carna Arthritis Reliever Geopen Persantine   Carprofen Gold's salt Persistin   Chloramphenicol Goody's Phenylbutazone   Chloromycetin Haltrain Piroxlcam   Clmetidine heparin Plaquenil   Cllnoril Hyco-pap Ponstel   Clofibrate Hydroxy chloroquine Propoxyphen         Before stopping any of these medications, be sure to consult the physician who ordered them.  Some, such as Coumadin (Warfarin) are ordered to prevent or treat serious conditions such as "deep thrombosis", "pumonary embolisms", and other heart problems.  The amount of time that you may need off of the medication may also vary with the medication and the reason for which you were taking it.  If you are taking any of these medications, please make sure you notify your pain physician before you undergo any procedures.         Epidural Steroid Injection Patient Information  Description: The epidural space surrounds the nerves as they exit the spinal cord.  In some patients, the nerves can be compressed and inflamed by a bulging disc or a tight spinal canal (spinal stenosis).  By injecting steroids into the epidural space, we can bring irritated nerves into direct contact with a potentially helpful medication.  These steroids act directly on the irritated nerves and can reduce swelling and inflammation which often  leads to decreased pain.  Epidural steroids may be injected anywhere along the spine and from the neck to the low back depending upon the location of your pain.   After numbing the skin with local anesthetic (like Novocaine), a small needle is passed into the epidural space slowly.  You may experience a sensation of pressure while this is being done.  The entire block usually last less than 10 minutes.  Conditions which may be treated by epidural steroids:   Low back and leg pain  Neck and arm pain  Spinal stenosis  Post-laminectomy syndrome  Herpes zoster (shingles) pain  Pain from compression fractures  Preparation for the injection:  5. Do not eat any solid food or dairy products within 6 hours of your appointment.  6. You may drink clear liquids up to 2 hours before appointment.  Clear liquids include water, black coffee, juice or soda.  No milk or cream please. 7. You may take your regular medication, including pain medications, with a sip of water before your appointment  Diabetics should hold regular insulin (if taken separately) and take 1/2 normal NPH dos the morning of the procedure.  Carry some sugar containing items with you to your appointment. 8. A driver must accompany you and be prepared to drive you home after your procedure.  9. Bring all your current medications with your. 10. An IV may be inserted and sedation may be given at the discretion of the physician.   11. A blood pressure cuff, EKG and other monitors will often be applied during the procedure.  Some patients may need to have extra oxygen administered for a short period. 12. You will be asked to provide medical information, including your allergies, prior to the procedure.  We must know immediately if you are taking blood thinners (like Coumadin/Warfarin)  Or if you are allergic to IV iodine contrast (dye). We must   know if you could possible be pregnant.  Possible side-effects:  Bleeding from needle  site  Infection (rare, may require surgery)  Nerve injury (rare)  Numbness & tingling (temporary)  Difficulty urinating (rare, temporary)  Spinal headache ( a headache worse with upright posture)  Light -headedness (temporary)  Pain at injection site (several days)  Decreased blood pressure (temporary)  Weakness in arm/leg (temporary)  Pressure sensation in back/neck (temporary)  Call if you experience:  Fever/chills associated with headache or increased back/neck pain.  Headache worsened by an upright position.  New onset weakness or numbness of an extremity below the injection site  Hives or difficulty breathing (go to the emergency room)  Inflammation or drainage at the infection site  Severe back/neck pain  Any new symptoms which are concerning to you  Please note:  Although the local anesthetic injected can often make your back or neck feel good for several hours after the injection, the pain will likely return.  It takes 3-7 days for steroids to work in the epidural space.  You may not notice any pain relief for at least that one week.  If effective, we will often do a series of three injections spaced 3-6 weeks apart to maximally decrease your pain.  After the initial series, we generally will wait several months before considering a repeat injection of the same type.  If you have any questions, please call [phone]  [facility] 

## 2015-12-13 NOTE — Progress Notes (Signed)
Safety precautions to be maintained throughout the outpatient stay will include: orient to surroundings, keep bed in low position, maintain call bell within reach at all times, provide assistance with transfer out of bed and ambulation.  

## 2015-12-14 ENCOUNTER — Ambulatory Visit: Payer: Medicare Other | Admitting: Internal Medicine

## 2015-12-14 LAB — C-REACTIVE PROTEIN: CRP: 1.5 mg/dL — AB (ref <1.0)

## 2015-12-16 ENCOUNTER — Other Ambulatory Visit: Payer: Self-pay

## 2015-12-18 LAB — COMPLIANCE DRUG ANALYSIS, UR: PDF: 0

## 2015-12-19 NOTE — Progress Notes (Signed)
Quick Note:  Lab results reviewed and found to be within normal limits. ______ 

## 2015-12-19 NOTE — Progress Notes (Signed)

## 2015-12-21 ENCOUNTER — Other Ambulatory Visit: Payer: Self-pay | Admitting: Internal Medicine

## 2015-12-21 DIAGNOSIS — F3341 Major depressive disorder, recurrent, in partial remission: Secondary | ICD-10-CM | POA: Insufficient documentation

## 2015-12-21 DIAGNOSIS — R1031 Right lower quadrant pain: Secondary | ICD-10-CM

## 2015-12-28 ENCOUNTER — Telehealth: Payer: Self-pay

## 2015-12-28 ENCOUNTER — Ambulatory Visit
Admission: RE | Admit: 2015-12-28 | Discharge: 2015-12-28 | Disposition: A | Discharge status: Home / Self Care | Payer: Medicare Other | Source: Ambulatory Visit | Attending: Internal Medicine | Admitting: Internal Medicine

## 2015-12-28 DIAGNOSIS — K760 Fatty (change of) liver, not elsewhere classified: Secondary | ICD-10-CM | POA: Diagnosis not present

## 2015-12-28 DIAGNOSIS — R1031 Right lower quadrant pain: Secondary | ICD-10-CM | POA: Insufficient documentation

## 2015-12-28 DIAGNOSIS — Z9889 Other specified postprocedural states: Secondary | ICD-10-CM | POA: Insufficient documentation

## 2015-12-28 NOTE — Telephone Encounter (Signed)
Sent referral information to [facility] neurology for nerve conduction SA

## 2016-01-11 ENCOUNTER — Ambulatory Visit: Payer: Medicare Other | Attending: Pain Medicine | Admitting: Pain Medicine

## 2016-01-11 ENCOUNTER — Encounter: Payer: Self-pay | Admitting: Pain Medicine

## 2016-01-11 VITALS — BP 128/86 | HR 115 | Temp 98.1°F | Resp 18 | Ht 63.0 in | Wt 161.0 lb

## 2016-01-11 DIAGNOSIS — F17200 Nicotine dependence, unspecified, uncomplicated: Secondary | ICD-10-CM | POA: Diagnosis not present

## 2016-01-11 DIAGNOSIS — M549 Dorsalgia, unspecified: Secondary | ICD-10-CM | POA: Diagnosis present

## 2016-01-11 DIAGNOSIS — M5020 Other cervical disc displacement, unspecified cervical region: Secondary | ICD-10-CM

## 2016-01-11 DIAGNOSIS — M542 Cervicalgia: Secondary | ICD-10-CM | POA: Diagnosis present

## 2016-01-11 DIAGNOSIS — M4804 Spinal stenosis, thoracic region: Secondary | ICD-10-CM | POA: Diagnosis not present

## 2016-01-11 DIAGNOSIS — H409 Unspecified glaucoma: Secondary | ICD-10-CM | POA: Insufficient documentation

## 2016-01-11 DIAGNOSIS — R109 Unspecified abdominal pain: Secondary | ICD-10-CM | POA: Insufficient documentation

## 2016-01-11 DIAGNOSIS — G8929 Other chronic pain: Secondary | ICD-10-CM | POA: Diagnosis not present

## 2016-01-11 DIAGNOSIS — F419 Anxiety disorder, unspecified: Secondary | ICD-10-CM | POA: Insufficient documentation

## 2016-01-11 DIAGNOSIS — M5412 Radiculopathy, cervical region: Secondary | ICD-10-CM | POA: Diagnosis not present

## 2016-01-11 DIAGNOSIS — Z79891 Long term (current) use of opiate analgesic: Secondary | ICD-10-CM | POA: Insufficient documentation

## 2016-01-11 DIAGNOSIS — M25551 Pain in right hip: Secondary | ICD-10-CM | POA: Diagnosis not present

## 2016-01-11 DIAGNOSIS — M4316 Spondylolisthesis, lumbar region: Secondary | ICD-10-CM | POA: Insufficient documentation

## 2016-01-11 DIAGNOSIS — E559 Vitamin D deficiency, unspecified: Secondary | ICD-10-CM | POA: Insufficient documentation

## 2016-01-11 DIAGNOSIS — M5481 Occipital neuralgia: Secondary | ICD-10-CM | POA: Diagnosis not present

## 2016-01-11 DIAGNOSIS — R52 Pain, unspecified: Secondary | ICD-10-CM | POA: Insufficient documentation

## 2016-01-11 DIAGNOSIS — Z5181 Encounter for therapeutic drug level monitoring: Secondary | ICD-10-CM | POA: Diagnosis not present

## 2016-01-11 DIAGNOSIS — M5124 Other intervertebral disc displacement, thoracic region: Secondary | ICD-10-CM | POA: Diagnosis not present

## 2016-01-11 DIAGNOSIS — M170 Bilateral primary osteoarthritis of knee: Secondary | ICD-10-CM | POA: Insufficient documentation

## 2016-01-11 DIAGNOSIS — R51 Headache: Secondary | ICD-10-CM

## 2016-01-11 DIAGNOSIS — G4486 Cervicogenic headache: Secondary | ICD-10-CM

## 2016-01-11 DIAGNOSIS — M545 Low back pain: Secondary | ICD-10-CM

## 2016-01-11 DIAGNOSIS — M47816 Spondylosis without myelopathy or radiculopathy, lumbar region: Secondary | ICD-10-CM | POA: Diagnosis not present

## 2016-01-11 DIAGNOSIS — Z5189 Encounter for other specified aftercare: Secondary | ICD-10-CM

## 2016-01-11 DIAGNOSIS — M25561 Pain in right knee: Secondary | ICD-10-CM | POA: Diagnosis not present

## 2016-01-11 DIAGNOSIS — F17210 Nicotine dependence, cigarettes, uncomplicated: Secondary | ICD-10-CM | POA: Insufficient documentation

## 2016-01-11 DIAGNOSIS — Z9889 Other specified postprocedural states: Secondary | ICD-10-CM | POA: Insufficient documentation

## 2016-01-11 DIAGNOSIS — F329 Major depressive disorder, single episode, unspecified: Secondary | ICD-10-CM | POA: Diagnosis not present

## 2016-01-11 DIAGNOSIS — M5126 Other intervertebral disc displacement, lumbar region: Secondary | ICD-10-CM | POA: Diagnosis not present

## 2016-01-11 DIAGNOSIS — M4802 Spinal stenosis, cervical region: Secondary | ICD-10-CM | POA: Insufficient documentation

## 2016-01-11 DIAGNOSIS — E108 Type 1 diabetes mellitus with unspecified complications: Secondary | ICD-10-CM | POA: Insufficient documentation

## 2016-01-11 DIAGNOSIS — M5417 Radiculopathy, lumbosacral region: Secondary | ICD-10-CM | POA: Insufficient documentation

## 2016-01-11 DIAGNOSIS — G9389 Other specified disorders of brain: Secondary | ICD-10-CM | POA: Insufficient documentation

## 2016-01-11 DIAGNOSIS — Z9884 Bariatric surgery status: Secondary | ICD-10-CM | POA: Insufficient documentation

## 2016-01-11 DIAGNOSIS — M4806 Spinal stenosis, lumbar region: Secondary | ICD-10-CM | POA: Insufficient documentation

## 2016-01-11 DIAGNOSIS — M50222 Other cervical disc displacement at C5-C6 level: Secondary | ICD-10-CM | POA: Insufficient documentation

## 2016-01-11 DIAGNOSIS — F331 Major depressive disorder, recurrent, moderate: Secondary | ICD-10-CM | POA: Insufficient documentation

## 2016-01-11 DIAGNOSIS — Z981 Arthrodesis status: Secondary | ICD-10-CM | POA: Insufficient documentation

## 2016-01-11 DIAGNOSIS — F32A Depression, unspecified: Secondary | ICD-10-CM | POA: Insufficient documentation

## 2016-01-11 DIAGNOSIS — R45851 Suicidal ideations: Secondary | ICD-10-CM | POA: Insufficient documentation

## 2016-01-11 DIAGNOSIS — M25512 Pain in left shoulder: Secondary | ICD-10-CM | POA: Insufficient documentation

## 2016-01-11 DIAGNOSIS — M47812 Spondylosis without myelopathy or radiculopathy, cervical region: Secondary | ICD-10-CM | POA: Insufficient documentation

## 2016-01-11 DIAGNOSIS — M48061 Spinal stenosis, lumbar region without neurogenic claudication: Secondary | ICD-10-CM | POA: Insufficient documentation

## 2016-01-11 DIAGNOSIS — Z96652 Presence of left artificial knee joint: Secondary | ICD-10-CM | POA: Diagnosis not present

## 2016-01-11 DIAGNOSIS — M25552 Pain in left hip: Secondary | ICD-10-CM | POA: Diagnosis not present

## 2016-01-11 DIAGNOSIS — M25562 Pain in left knee: Secondary | ICD-10-CM | POA: Insufficient documentation

## 2016-01-11 DIAGNOSIS — M25511 Pain in right shoulder: Secondary | ICD-10-CM | POA: Diagnosis not present

## 2016-01-11 DIAGNOSIS — Q762 Congenital spondylolisthesis: Secondary | ICD-10-CM

## 2016-01-11 DIAGNOSIS — M961 Postlaminectomy syndrome, not elsewhere classified: Secondary | ICD-10-CM

## 2016-01-11 DIAGNOSIS — I10 Essential (primary) hypertension: Secondary | ICD-10-CM | POA: Insufficient documentation

## 2016-01-11 DIAGNOSIS — M539 Dorsopathy, unspecified: Secondary | ICD-10-CM

## 2016-01-11 DIAGNOSIS — G43909 Migraine, unspecified, not intractable, without status migrainosus: Secondary | ICD-10-CM | POA: Diagnosis not present

## 2016-01-11 DIAGNOSIS — M4310 Spondylolisthesis, site unspecified: Secondary | ICD-10-CM

## 2016-01-11 DIAGNOSIS — M50223 Other cervical disc displacement at C6-C7 level: Secondary | ICD-10-CM | POA: Insufficient documentation

## 2016-01-11 NOTE — Assessment & Plan Note (Signed)
This is likely to be responsible for the patient's left lower extremity pain including the pain in the area of the knee.

## 2016-01-11 NOTE — Assessment & Plan Note (Signed)
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 Because of this, I highly recommend to do diagnostic bilateral lumbar facet blocks before considering any further lumbar spine surgery. At this point the patient has already had 2 of these surgeries and continues to have low back pain. Although the surgery was absolutely necessary for her dynamic instability and her radicular symptoms, it is a well known fact that lumbar spine surgeries are not good for the treatment of low back pain.

## 2016-01-11 NOTE — Progress Notes (Signed)
Patient refuses to go to ED.  States she is suicidal and has not 1 plan, but 2.  States she will run out of here if we make her go.  States she will come back tomorrow for procedure.  Husband is not at bedside, has gone back to work.Dr Dossie Arbour and Alinda Sierras notified.

## 2016-01-11 NOTE — Assessment & Plan Note (Signed)
At this point there is no question that this patient is experiencing pain. However, her case is complicated by her psychiatric issues. She is currently having problems with depression and suicidal ideations. This last one has always represented an ethical dilemma when prescribing opioids due to the medications capability of causing respiratory depression and death when taken excessively. 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

## 2016-01-11 NOTE — Assessment & Plan Note (Signed)
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

## 2016-01-11 NOTE — Progress Notes (Signed)
Safety precautions to be maintained throughout the outpatient stay will include: orient to surroundings, keep bed in low position, maintain call bell within reach at all times, provide assistance with transfer out of bed and ambulation.  

## 2016-01-11 NOTE — Progress Notes (Signed)
Patient's Name: Brittany Cummings MRN: [id number] DOB: [birth year] DOS: 01/11/2016  Primary Reason(s) for Visit: Encounter for Medication Management CC: Neck Pain and Back Pain   HPI  Brittany Cummings is a 50 y.o. year old, female patient, who returns today as an established patient. 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); 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); Lumbar facet hypertrophy (Severe at L4-5); Chronic neck pain (Bilateral) (L>R); Cervicogenic headache (Left); Occipital neuralgia (Left); Chronic shoulder pain (Bilateral) (R>L); Chronic cervical radicular pain (Bilateral) (L>R); Chronic low back pain (Location of Primary Source of Pain) (Bilateral) (R>L); Lumbar facet syndrome (Location of Primary Source of Pain) (Bilateral) (R>L); Chronic knee pain (Location of Secondary source of pain) (Bilateral) (R>L); Chronic lumbar radicular pain (Left); Lumbosacral radiculopathy at L4 (Left); Chronic lower extremity pain (Left); Posture arthritis of knee (Location of Secondary source of pain) (Bilateral) (R>L); Chronic foot pain (Bilateral) (L>R); Chronic hip pain (Bilateral) (R>L); Recurrent major depressive disorder, in partial remission ([place]); Suicidal ideations; Clinical depression; Lumbar foraminal stenosis (Severe) (L4-5) (Left);  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 Her primarily concern today is the Neck Pain and Back Pain  The patient comes in today clinics today accompanied by her husband who works at the [employer]. The patient tells me that she has been off of all pain medications for the past 10 days. Evaluation of the [place] reveals that she had a prescription for butorphanol (Stadol nasal spray) last dispensed on 01/07/2016 (11 day supply). In addition it reveals that the patient had a prescription for hydrocodone/APAP 5/325 last filled on 12/23/2015. This was supposed to be a 30 day prescription. This too were written for her by Dr. Emily Filbert. In addition, it showed that the patient had a prescription for oxycodone IR 10 mg #60 pills last filled on 12/17/2015. This was supposed to be a 10 day prescription. This was written by Dr. Karie Chimera.  According to the patient Dr. Karie Chimera told her that he would not be able to continue prescribing for her or to any surgery on her since he was having some medical problems of his own and may even have to retire from doing surgeries. She is now concerned about what was said that can evaluate her for possible spinal surgery. Today I have written an order for a referral to neurosurgery so that they can I sign another neurosurgeon to her case. The patient indicates that her primary source of pain is that of the lower back with the right side being worst on the left. Her secondary source of pain is that of the knees with the right being worst on the left. She has a history of a left total knee replacement, but she still has pain in that area suggesting that it may be coming from elsewhere. Her history is also significant for a failed back surgery  syndrome (prior L4-5 fusion). A CT myelogram of the lumbar spine done on 07/21/2015 shows  that the patient has advanced facet arthropathy and ligamentum flavum hypertrophy at the L4-5 level with a left foraminal synovial cyst. It also shows left L5-S1 disc space narrowing and postsurgical changes on the left with cautious narrowing of the left foraminal zone which could be affecting the left L5 nerve root. An MRI of the same area done on 05/05/2015 showed stable postoperative L5-S1 changes with progressed chronic severe facet arthropathy at L4-5 with new left sided synovial cyst resulting in moderate to severe left L4 neural foraminal stenosis.  Clinically, the patient seems to be having a bilateral lumbar facet syndrome. In addition to this, and taking into account that the patient already had a left total knee replacement and is still having pain in that area clinically she has evidence of a left L4 radiculitis/radiculopathy that is likely to be coming from the severe left L4-5 foraminal stenosis.  In addition to this, the patient has been having problems with left-sided neck pain and shoulder pain. Although she initially indicated that this ranked as her third worst pain, today this seems to be affecting her significantly.  Unfortunately, the patient has communicated to me today that she is actively having suicidal ideations. In view of this, I have communicated to the patient that she needs to be seen by a psychiatrist before I can prescribe any type of medication to her. I explained to her the legal issues associated with it and the fact that it wouldn't be responsible on my part to provide her with medications that can easily kill her when she is in fact having these suicidal thoughts. Of course, the second 77 notify the patient about this, she then started panicking and crying and telling her husband that they shouldn't have told me about her suicidal ideations. As it turns out, the patient had already had a similar situation in [place] where she ended up being hospitalized for one month  due to the same issue. The concern that we always having pain management with this type of situations is that occasionally the patients will do this as a form of black female to get pain medicines or to have their doses increased. However, in this case since the patient does know what can happen when a physician is told that the patient is having suicidal ideations, this gives more weight to the possibility that this is not black female but true suicidal thoughts and therefore more of a reason why I cannot give this patient any opioids at this time. Today we attempted to get this patient to voluntarily go to the emergency room to be admitted, but because of her prior experiences she declined and she indicated that if we attempted to do this simply sneak out and leave.  When the patient became upset when I informed her that I would not be prescribing the opioids today, I made it very clear to her and her husband that this does not mean that I would not help her. I made it very clear that they are more than 1 type of treatment plan that we to use. Unfortunately, she is fixated with the pain medications and she is reluctant to have any injection therapies apparently due to a bad experience that she had in [place] when she received some injections. I went into "Care Everywhere" and I was unable to identify where the patient had received this injection. From her initial description, it  would sound as if she had received this nerve block from a radiologist. In any case, I have offered her to come in tomorrow to start treating this pain with interventional therapies so as to minimize the need for any opioids medications. I offered her a bilateral lumbar facet block under fluoroscopic guidance and IV sedation since she had indicated to me that her primary pain was the lower back. To my surprise, she chose to have her cervical area treated first. In view of this, I have scheduled her to come back for a left-sided cervical  epidural steroid injection under fluoroscopic guidance and IV sedation. This type of injections don't necessarily require IV sedation, but in view of the patient's level of anxiety, it may be useful to provide her some intravenous anxiolysis.  As I reviewed the medical records to write this note, I noticed that the patient left my office and went to Dr. Myrtis Ser office, where she told them that she was non-suicidal and needed some pain medication. She did get a prescription for hydrocodone/APAP (Norco) 7.5/325 to be taken every 8 hours #42 with no refills.  Pain Assessment: Self-Reported Pain Score: 8 , clinically she looks like a 3-4/10. Reported level is inconsistent with clinical obrservations. Pain Type: Chronic pain Pain Descriptors / Indicators: Burning, Sharp  Date of Last Visit: 12/13/15 Service Provided on Last Visit: Evaluation  Controlled Substance Pharmacotherapy Assessment  Analgesic: Hydrocodone/APAP 5/325 one tablet by mouth twice a day (10 mg/day) + oxycodone IR 10 mg (6 per day) (60 mg/day) + butorphanol (Stadol nasal spray) 10 mg/mL Pill Count: The patient did not bring her bottles or pills to her appointment today. MME/day: 100 mg/day Pharmacokinetics: N/A Pharmacodynamics: N/A Monitoring: [place] PMP: Online review of the past 83-month period conducted. Discrepancies found between information provided by the patient and the database UDS Results/interpretation: The patient's last UDS was done on 12/13/2015 and it came back with unexpected results. The test came back positive for the presence of ethyl alcohol but because there was also the presence of glucose and the specimen this has fill alcohol is likely due to, at least in part, to confirm irritation of glucose. The sample also detected temazepam which had not been declared. There were also some medications that were declared that not present and these included hydrocodone, butorphanol, gabapentin, cyclobenzaprine,  acetaminophen, and metoprolol. Medication Assessment Form: Discrepancies found between patient's report and information collected Treatment compliance: Not applicable Risk Assessment: Aberrant Behavior: claims that "nothing else works", resistance to changing therapy and extensive time discussing medication . After leaving my office, she went to her primary care physician's office where she managed to get a prescription for hydrocodone/APAP 7.5/325. Substance Use Disorder (SUD) Risk Level: High Opioid Risk Tool (ORT) Score: Total Score: 4 Moderate Risk for SUD (Score between 4-7) Depression Scale Score: PHQ-2: PHQ-2 Total Score: 6 78.6% Probability of major depressive disorder (6) PHQ-9: PHQ-9 Total Score: 18 Moderately severe depression (15-19)  Pharmacologic Plan: Today the patient indicated having suicidal ideations. Because of this, I have decided not to start the patient on any pain medication until clearance is granted by a psychiatrist. I have explained to the patient and to her husband that it would make no sense in given her the pain medications since they can be used as a means to kill herself. Since statistically females commit more sore sides by way of poisoning or overdosing, I feel he would not be responsible on my part to provide her with the means  to do so.   Laboratory Workup  Last ED UDS: No results found for: THCU, COCAINSCRNUR, PCPSCRNUR, MDMA, AMPHETMU, METHADONE, ETOH  Inflammation Markers Lab Results  Component Value Date   ESRSEDRATE 2 12/13/2015   CRP 1.5* 12/13/2015    Renal Function Lab Results  Component Value Date   BUN 12 12/13/2015   CREATININE 0.58 12/13/2015   GFRAA >60 12/13/2015   GFRNONAA >60 12/13/2015    Hepatic Function Lab Results  Component Value Date   AST 23 12/13/2015   ALT 12* 12/13/2015   ALBUMIN 4.1 12/13/2015    Electrolytes Lab Results  Component Value Date   NA 137 12/13/2015   K 4.4 12/13/2015   CL 104 12/13/2015    CALCIUM 9.0 12/13/2015   MG 2.1 12/13/2015   Allergies  Ms. Conover is allergic to meperidine.  Meds  The patient has a current medication list which includes the following prescription(s): amlodipine-olmesartan, azelastine, butorphanol, calcium carb-cholecalciferol, cyclobenzaprine, diclofenac sodium, duloxetine, estradiol-norethindrone, fluticasone, gabapentin, glimepiride, hydrocodone-acetaminophen, insulin nph-regular human, metoprolol succinate, multi-vitamins, oxycodone hcl, pantoprazole, quetiapine, and sucralfate.  Current Outpatient Prescriptions on File Prior to Visit  Medication Sig  . amLODipine-olmesartan (AZOR) 10-40 MG tablet Take 1 tablet by mouth daily.   Marland Kitchen azelastine (ASTELIN) 0.1 % nasal spray Place 1 spray into both nostrils 2 (two) times daily. Use in each nostril as directed  . butorphanol (STADOL) 10 MG/ML nasal spray PLACE 1 SPRAY INTO THE LEFT NOSTRIL AS DIRECTED  . Calcium Carb-Cholecalciferol (CALCIUM 600/VITAMIN D3) 600-800 MG-UNIT TABS Take 1 tablet by mouth daily.   . cyclobenzaprine (FLEXERIL) 10 MG tablet Take 1 tablet (10 mg total) by mouth 3 (three) times daily as needed for muscle spasms.  . diclofenac sodium (VOLTAREN) 1 % GEL Apply 2 g topically 4 (four) times daily.   . DULoxetine (CYMBALTA) 60 MG capsule Take 60 mg by mouth daily.   Marland Kitchen estradiol-norethindrone (MIMVEY) 1-0.5 MG tablet Take 1 tablet by mouth daily.   . fluticasone (FLONASE) 50 MCG/ACT nasal spray Place 2 sprays into both nostrils daily.  Marland Kitchen gabapentin (NEURONTIN) 300 MG capsule Take 300 mg by mouth at bedtime.   Marland Kitchen glimepiride (AMARYL) 4 MG tablet Take 4 mg by mouth daily with breakfast.   . HYDROcodone-acetaminophen (NORCO/VICODIN) 5-325 MG tablet Take 1 tablet by mouth 2 (two) times daily.  . insulin NPH-regular Human (NOVOLIN 70/30) (70-30) 100 UNIT/ML injection Inject 40 Units into the skin 2 (two) times daily.   . metoprolol succinate (TOPROL-XL) 100 MG 24 hr tablet Take 100 mg by mouth  daily.   . Multiple Vitamin (MULTI-VITAMINS) TABS Take 1 tablet by mouth daily.   . Oxycodone HCl 10 MG TABS Take 10 mg by mouth every 4 (four) hours as needed (for pain).   . pantoprazole (PROTONIX) 40 MG tablet Take 40 mg by mouth daily.   . QUEtiapine (SEROQUEL) 100 MG tablet Take 100 mg by mouth at bedtime.   . sucralfate (CARAFATE) 1 G tablet Take 1 g by mouth 4 (four) times daily -  with meals and at bedtime.   No current facility-administered medications on file prior to visit.    ROS  Constitutional: Afebrile, no chills, well hydrated and well nourished Gastrointestinal: negative Musculoskeletal:negative Neurological: negative Behavioral/Psych: positive for depression and suicidal ideations  PFSH  Medical:  Ms. Caler  has a past medical history of Spondylolisthesis; Diabetes mellitus without complication ([place]); Hypertension; Spinal headache; Depression; Seasonal allergies; Diabetic neuropathy ([place]); GERD (gastroesophageal reflux disease); Multiple gastric ulcers; Arthritis;  Rib fracture; Cervical disc disease (02/08/2014); and Spondylolisthesis of lumbar region (09/15/2015). Family: family history includes Cancer in her mother; Cancer - Colon in her father; Diabetes in her other; Hypertension in her other; Stroke in her other. Surgical:  has past surgical history that includes Gastric bypass; Back surgery; Breast reduction surgery; Appendectomy; Carpal tunnel release; Joint replacement; Knee arthroscopy; Cataract extraction; abdominal abscess excision; Diagnostic laparoscopy; Cholecystectomy; Hernia repair; Colonoscopy w/ biopsies and polypectomy; Wisdom tooth extraction; and Spinal fusion (09-15-2015). Tobacco:  reports that she has been smoking Cigarettes.  She has a 25 pack-year smoking history. She has never used smokeless tobacco. Alcohol:  reports that she does not drink alcohol. Drug:  reports that she does not use illicit drugs.  Physical Exam  Vitals:  Today's Vitals    01/11/16 0803 01/11/16 0806  BP: 128/86   Pulse: 115   Temp: 98.1 F (36.7 C)   TempSrc: Oral   Resp: 18   Height: 5\' 3"  (1.6 m)   Weight: 161 lb (73.029 kg)   SpO2: 98%   PainSc:  8     Calculated BMI: Body mass index is 28.53 kg/(m^2). Overweight (25-29.9 kg/m2) - 20% higher incidence of chronic pain  General appearance: alert, appears older than stated age, no distress and moderately obese Eyes: PERLA Respiratory: No evidence respiratory distress, no audible rales or ronchi and no use of accessory muscles of respiration   Assessment & Plan  Primary Diagnosis & Pertinent Problem List: The primary encounter diagnosis was Chronic pain. 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  Visit Diagnosis: 1. Chronic pain   2. Failed back surgical syndrome (L4-5 fusion)   3. Encounter for therapeutic drug level monitoring   4. Long term current use of opiate analgesic   5. Chronic cervical radicular pain (Bilateral) (L>R)   6. Suicidal ideations   7. Clinical depression   8. Lumbar foraminal stenosis (Severe) (L4-5) (Left)   9. 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 Cervical herniated disc (C5-6 and C6-7)   12. Thoracic disc herniation (T1-2) (Left)   13. Cervicogenic headache (Left)   14.  Lumbar facet syndrome (Location of Primary Source of Pain) (Bilateral) (R>L)   15. 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 The patient has had this identified since 2008, but has decided not to have surgery for what ever reason until now. Apparently now her symptoms are severe enough that she is willing to entertain this idea. I have ordered a repeat MRI of the cervical spine since the last one showed severe stenosis in 2008.  Cervicogenic headache (Left) This is likely due to her cervical spine pathology.  Lumbar foraminal stenosis (Severe) (L4-5) (Left) This is likely to be responsible for the patient's left lower  extremity pain including the pain in the area of the knee.  Lumbar facet syndrome (Location of Primary Source of Pain) (Bilateral) (R>L) Because the patient's primary pain is in the lower back and not in the lower extremity, it is more likely than not that the etiology of this pain is this severe facet hypertrophy observed at the L4-5 level along with any residual spondylolisthesis as a consequence of her prior dynamic anterolisthesis of L4 over L5. Because of this, I highly recommend to do diagnostic bilateral lumbar facet blocks before considering any further lumbar spine surgery. At this point the patient has already had 2 of these surgeries and continues to have low back pain. Although the surgery was absolutely necessary for her dynamic instability and her radicular symptoms, it is a well known fact that lumbar spine surgeries are not good for the treatment of low back pain.  Pain management At this point there is no question that this patient is experiencing pain. However, her case is complicated by her psychiatric issues. She is currently having problems with depression and suicidal  ideations. This last one has always represented an ethical dilemma when prescribing opioids due to the medications capability of causing respiratory depression and death when taken excessively. 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   Plan of Care  Pharmacotherapy (Medications Ordered): No orders of the defined types were placed in this encounter.    Lab-work & Procedure Ordered: Orders Placed This Encounter  Procedures  . CERVICAL EPIDURAL STEROID INJECTION    Standing Status: Future     Number of Occurrences:      Standing Expiration Date: 01/10/2017    Scheduling Instructions:     Side: Left-sided     Sedation: With Sedation.     Timeframe: Tomorrow    Order Specific Question:  Where will this procedure be performed?    Answer:  [facility]  . MR Cervical Spine Wo Contrast    Standing Status: Future     Number of Occurrences:      Standing Expiration Date: 01/10/2017    Scheduling Instructions:     Please provide canal diameter in millimeters when describing any spinal stenosis.    Order Specific Question:  Reason for Exam (SYMPTOM  OR DIAGNOSIS REQUIRED)    Answer:  Cervical radiculitis/radiculopathy.    Order Specific Question:  Preferred imaging location?    Answer:  [facility]    Order Specific Question:  Does the patient have a pacemaker or implanted devices?    Answer:  No    Order Specific Question:  What is the patient's sedation requirement?    Answer:  No Sedation    Order Specific Question:  Call Results- Best Contact Number?    Answer:  [phone] (Pain Clinic facility) (Dr. Dossie Arbour)  . ToxASSURE Select 13 (MW), Urine    Volume: 30 ml(s). Minimum 3 ml of urine is needed. Document temperature of fresh sample. Indications: Long term (current) use of opiate analgesic (Z79.891)  . Ambulatory referral to Neurosurgery    Referral Priority:   Routine    Referral Type:  Surgical    Referral Reason:  Specialty Services Required    Requested Specialty:  Neurosurgery    Number of Visits Requested:  1  . Ambulatory referral to Psychiatry    Referral Priority:  Routine    Referral Type:  Psychiatric  Referral Reason:  Specialty Services Required    Requested Specialty:  Psychiatry    Number of Visits Requested:  1    Imaging Ordered: AMB REFERRAL TO NEUROSURGERY AMB REFERRAL TO PSYCHIATRY MR CERVICAL SPINE WO CONTRAST  Interventional Therapies: Scheduled: Left cervical epidural steroid injection under fluoroscopic guidance and IV sedation, moreover if possible. PRN Procedures: None at this point.    Referral(s) or Consult(s):  1. Referral to psychiatry secondary to depression and suicidal ideations. 2. Referral to neurosurgery for possible cervical decompression to severe cervical spinal stenosis.  Medications administered during this visit: Ms. Yoke does not currently have medications on file.  Future Appointments Date Time Provider [place]  01/12/2016 8:20 AM Milinda Pointer, MD [facility] None  01/17/2016 8:00 AM [facility]    Primary Care Physician: Rusty Aus, MD Location: [facility] Facility Note by: Kathlen Brunswick. Dossie Arbour, M.D, DABA, DABAPM, DABPM, DABIPP, FIPP  Pain Score Disclaimer: We use the NRS-11 scale. This is a self-reported, subjective measurement of pain severity with only modest accuracy. It is used primarily to identify changes within a particular patient. It must be understood that outpatient pain scales are significantly less accurate that those used for research, where they can be applied under ideal controlled circumstances with minimal exposure to variables. In reality, the score is likely to be a combination of pain intensity and pain affect, where pain affect describes the degree of emotional arousal or changes in action readiness caused by the  sensory experience of pain. Factors such as social and work situation, setting, emotional state, anxiety levels, expectation, and prior pain experience may influence pain perception and show large inter-individual differences that may also be affected by time variables.

## 2016-01-11 NOTE — Assessment & Plan Note (Addendum)
This is likely to be responsible for the patient's neck pain, shoulder pain, and upper extremity Cervical radicular symptoms. The patient has had this identified since 2008, but has decided not to have surgery for what ever reason until now. Apparently now her symptoms are severe enough that she is willing to entertain this idea. I have ordered a repeat MRI of the cervical spine since the last one showed severe stenosis in 2008.

## 2016-01-11 NOTE — Progress Notes (Signed)
V.Neese at bedside talking with patient. After a long discussion with patient and her explicit refusal to be assessed in the Emergency Department she agreed to go to her primary care physician Dr. Emily Filbert at [facility] and has also agreed to see a psychiatrist as soon as we can get her an appointment. States that she just wants to feel better. Feels hopeless right now. States Dr. Sabra Heck is adjusting her medications and has decreased cymbalta recently. An appointment was made with Dr. Sabra Heck for her to come there as soon as she is discharged from here. Patient is in agreement with this plan and also plans to come back for a procedure tomorrow at this clinic. Verbalized that she has no intentions to harm herself at this time and will follow the agreed on plan of care.

## 2016-01-11 NOTE — Patient Instructions (Signed)
GENERAL RISKS AND COMPLICATIONS  What are the risk, side effects and possible complications? Generally speaking, most procedures are safe.  However, with any procedure there are risks, side effects, and the possibility of complications.  The risks and complications are dependent upon the sites that are lesioned, or the type of nerve block to be performed.  The closer the procedure is to the spine, the more serious the risks are.  Great care is taken when placing the radio frequency needles, block needles or lesioning probes, but sometimes complications can occur. 1. Infection: Any time there is an injection through the skin, there is a risk of infection.  This is why sterile conditions are used for these blocks.  There are four possible types of infection. 1. Localized skin infection. 2. Central Nervous System Infection-This can be in the form of Meningitis, which can be deadly. 3. Epidural Infections-This can be in the form of an epidural abscess, which can cause pressure inside of the spine, causing compression of the spinal cord with subsequent paralysis. This would require an emergency surgery to decompress, and there are no guarantees that the patient would recover from the paralysis. 4. Discitis-This is an infection of the intervertebral discs.  It occurs in about 1% of discography procedures.  It is difficult to treat and it may lead to surgery.        2. Pain: the needles have to go through skin and soft tissues, will cause soreness.       3. Damage to internal structures:  The nerves to be lesioned may be near blood vessels or    other nerves which can be potentially damaged.       4. Bleeding: Bleeding is more common if the patient is taking blood thinners such as  aspirin, Coumadin, Ticiid, Plavix, etc., or if he/she have some genetic predisposition  such as hemophilia. Bleeding into the spinal canal can cause compression of the spinal  cord with subsequent paralysis.  This would require an  emergency surgery to  decompress and there are no guarantees that the patient would recover from the  paralysis.       5. Pneumothorax:  Puncturing of a lung is a possibility, every time a needle is introduced in  the area of the chest or upper back.  Pneumothorax refers to free air around the  collapsed lung(s), inside of the thoracic cavity (chest cavity).  Another two possible  complications related to a similar event would include: Hemothorax and Chylothorax.   These are variations of the Pneumothorax, where instead of air around the collapsed  lung(s), you may have blood or chyle, respectively.       6. Spinal headaches: They may occur with any procedures in the area of the spine.       7. Persistent CSF (Cerebro-Spinal Fluid) leakage: This is a rare problem, but may occur  with prolonged intrathecal or epidural catheters either due to the formation of a fistulous  track or a dural tear.       8. Nerve damage: By working so close to the spinal cord, there is always a possibility of  nerve damage, which could be as serious as a permanent spinal cord injury with  paralysis.       9. Death:  Although rare, severe deadly allergic reactions known as "Anaphylactic  reaction" can occur to any of the medications used.      10. Worsening of the symptoms:  We can always make thing worse.    What are the chances of something like this happening? Chances of any of this occuring are extremely low.  By statistics, you have more of a chance of getting killed in a motor vehicle accident: while driving to the hospital than any of the above occurring .  Nevertheless, you should be aware that they are possibilities.  In general, it is similar to taking a shower.  Everybody knows that you can slip, hit your head and get killed.  Does that mean that you should not shower again?  Nevertheless always keep in mind that statistics do not mean anything if you happen to be on the wrong side of them.  Even if a procedure has a 1  (one) in a 1,000,000 (million) chance of going wrong, it you happen to be that one..Also, keep in mind that by statistics, you have more of a chance of having something go wrong when taking medications.  Who should not have this procedure? If you are on a blood thinning medication (e.g. Coumadin, Plavix, see list of "Blood Thinners"), or if you have an active infection going on, you should not have the procedure.  If you are taking any blood thinners, please inform your physician.  How should I prepare for this procedure?  Do not eat or drink anything at least six hours prior to the procedure.  Bring a driver with you .  It cannot be a taxi.  Come accompanied by an adult that can drive you back, and that is strong enough to help you if your legs get weak or numb from the local anesthetic.  Take all of your medicines the morning of the procedure with just enough water to swallow them.  If you have diabetes, make sure that you are scheduled to have your procedure done first thing in the morning, whenever possible.  If you have diabetes, take only half of your insulin dose and notify our nurse that you have done so as soon as you arrive at the clinic.  If you are diabetic, but only take blood sugar pills (oral hypoglycemic), then do not take them on the morning of your procedure.  You may take them after you have had the procedure.  Do not take aspirin or any aspirin-containing medications, at least eleven (11) days prior to the procedure.  They may prolong bleeding.  Wear loose fitting clothing that may be easy to take off and that you would not mind if it got stained with Betadine or blood.  Do not wear any jewelry or perfume  Remove any nail coloring.  It will interfere with some of our monitoring equipment.  NOTE: Remember that this is not meant to be interpreted as a complete list of all possible complications.  Unforeseen problems may occur.  BLOOD THINNERS The following drugs  contain aspirin or other products, which can cause increased bleeding during surgery and should not be taken for 2 weeks prior to and 1 week after surgery.  If you should need take something for relief of minor pain, you may take acetaminophen which is found in Tylenol,m Datril, Anacin-3 and Panadol. It is not blood thinner. The products listed below are.  Do not take any of the products listed below in addition to any listed on your instruction sheet.  A.P.C or A.P.C with Codeine Codeine Phosphate Capsules #3 Ibuprofen Ridaura  ABC compound Congesprin Imuran rimadil  Advil Cope Indocin Robaxisal  Alka-Seltzer Effervescent Pain Reliever and Antacid Coricidin or Coricidin-D  Indomethacin Rufen    Alka-Seltzer plus Cold Medicine Cosprin Ketoprofen S-A-C Tablets  Anacin Analgesic Tablets or Capsules Coumadin Korlgesic Salflex  Anacin Extra Strength Analgesic tablets or capsules CP-2 Tablets Lanoril Salicylate  Anaprox Cuprimine Capsules Levenox Salocol  Anexsia-D Dalteparin Magan Salsalate  Anodynos Darvon compound Magnesium Salicylate Sine-off  Ansaid Dasin Capsules Magsal Sodium Salicylate  Anturane Depen Capsules Marnal Soma  APF Arthritis pain formula Dewitt's Pills Measurin Stanback  Argesic Dia-Gesic Meclofenamic Sulfinpyrazone  Arthritis Bayer Timed Release Aspirin Diclofenac Meclomen Sulindac  Arthritis pain formula Anacin Dicumarol Medipren Supac  Analgesic (Safety coated) Arthralgen Diffunasal Mefanamic Suprofen  Arthritis Strength Bufferin Dihydrocodeine Mepro Compound Suprol  Arthropan liquid Dopirydamole Methcarbomol with Aspirin Synalgos  ASA tablets/Enseals Disalcid Micrainin Tagament  Ascriptin Doan's Midol Talwin  Ascriptin A/D Dolene Mobidin Tanderil  Ascriptin Extra Strength Dolobid Moblgesic Ticlid  Ascriptin with Codeine Doloprin or Doloprin with Codeine Momentum Tolectin  Asperbuf Duoprin Mono-gesic Trendar  Aspergum Duradyne Motrin or Motrin IB Triminicin  Aspirin  plain, buffered or enteric coated Durasal Myochrisine Trigesic  Aspirin Suppositories Easprin Nalfon Trillsate  Aspirin with Codeine Ecotrin Regular or Extra Strength Naprosyn Uracel  Atromid-S Efficin Naproxen Ursinus  Auranofin Capsules Elmiron Neocylate Vanquish  Axotal Emagrin Norgesic Verin  Azathioprine Empirin or Empirin with Codeine Normiflo Vitamin E  Azolid Emprazil Nuprin Voltaren  Bayer Aspirin plain, buffered or children's or timed BC Tablets or powders Encaprin Orgaran Warfarin Sodium  Buff-a-Comp Enoxaparin Orudis Zorpin  Buff-a-Comp with Codeine Equegesic Os-Cal-Gesic   Buffaprin Excedrin plain, buffered or Extra Strength Oxalid   Bufferin Arthritis Strength Feldene Oxphenbutazone   Bufferin plain or Extra Strength Feldene Capsules Oxycodone with Aspirin   Bufferin with Codeine Fenoprofen Fenoprofen Pabalate or Pabalate-SF   Buffets II Flogesic Panagesic   Buffinol plain or Extra Strength Florinal or Florinal with Codeine Panwarfarin   Buf-Tabs Flurbiprofen Penicillamine   Butalbital Compound Four-way cold tablets Penicillin   Butazolidin Fragmin Pepto-Bismol   Carbenicillin Geminisyn Percodan   Carna Arthritis Reliever Geopen Persantine   Carprofen Gold's salt Persistin   Chloramphenicol Goody's Phenylbutazone   Chloromycetin Haltrain Piroxlcam   Clmetidine heparin Plaquenil   Cllnoril Hyco-pap Ponstel   Clofibrate Hydroxy chloroquine Propoxyphen         Before stopping any of these medications, be sure to consult the physician who ordered them.  Some, such as Coumadin (Warfarin) are ordered to prevent or treat serious conditions such as "deep thrombosis", "pumonary embolisms", and other heart problems.  The amount of time that you may need off of the medication may also vary with the medication and the reason for which you were taking it.  If you are taking any of these medications, please make sure you notify your pain physician before you undergo any  procedures.         Epidural Steroid Injection Patient Information  Description: The epidural space surrounds the nerves as they exit the spinal cord.  In some patients, the nerves can be compressed and inflamed by a bulging disc or a tight spinal canal (spinal stenosis).  By injecting steroids into the epidural space, we can bring irritated nerves into direct contact with a potentially helpful medication.  These steroids act directly on the irritated nerves and can reduce swelling and inflammation which often leads to decreased pain.  Epidural steroids may be injected anywhere along the spine and from the neck to the low back depending upon the location of your pain.   After numbing the skin with local anesthetic (like Novocaine), a small needle is passed   into the epidural space slowly.  You may experience a sensation of pressure while this is being done.  The entire block usually last less than 10 minutes.  Conditions which may be treated by epidural steroids:   Low back and leg pain  Neck and arm pain  Spinal stenosis  Post-laminectomy syndrome  Herpes zoster (shingles) pain  Pain from compression fractures  Preparation for the injection:  1. Do not eat any solid food or dairy products within 8 hours of your appointment.  2. You may drink clear liquids up to 3 hours before appointment.  Clear liquids include water, black coffee, juice or soda.  No milk or cream please. 3. You may take your regular medication, including pain medications, with a sip of water before your appointment  Diabetics should hold regular insulin (if taken separately) and take 1/2 normal NPH dos the morning of the procedure.  Carry some sugar containing items with you to your appointment. 4. A driver must accompany you and be prepared to drive you home after your procedure.  5. Bring all your current medications with your. 6. An IV may be inserted and sedation may be given at the discretion of the  physician.   7. A blood pressure cuff, EKG and other monitors will often be applied during the procedure.  Some patients may need to have extra oxygen administered for a short period. 8. You will be asked to provide medical information, including your allergies, prior to the procedure.  We must know immediately if you are taking blood thinners (like Coumadin/Warfarin)  Or if you are allergic to IV iodine contrast (dye). We must know if you could possible be pregnant.  Possible side-effects:  Bleeding from needle site  Infection (rare, may require surgery)  Nerve injury (rare)  Numbness & tingling (temporary)  Difficulty urinating (rare, temporary)  Spinal headache ( a headache worse with upright posture)  Light -headedness (temporary)  Pain at injection site (several days)  Decreased blood pressure (temporary)  Weakness in arm/leg (temporary)  Pressure sensation in back/neck (temporary)  Call if you experience:  Fever/chills associated with headache or increased back/neck pain.  Headache worsened by an upright position.  New onset weakness or numbness of an extremity below the injection site  Hives or difficulty breathing (go to the emergency room)  Inflammation or drainage at the infection site  Severe back/neck pain  Any new symptoms which are concerning to you  Please note:  Although the local anesthetic injected can often make your back or neck feel good for several hours after the injection, the pain will likely return.  It takes 3-7 days for steroids to work in the epidural space.  You may not notice any pain relief for at least that one week.  If effective, we will often do a series of three injections spaced 3-6 weeks apart to maximally decrease your pain.  After the initial series, we generally will wait several months before considering a repeat injection of the same type.  If you have any questions, please call [phone] [facility] 

## 2016-01-12 ENCOUNTER — Ambulatory Visit: Payer: Medicare Other | Attending: Pain Medicine | Admitting: Pain Medicine

## 2016-01-12 ENCOUNTER — Encounter: Payer: Self-pay | Admitting: Pain Medicine

## 2016-01-12 VITALS — BP 123/86 | HR 101 | Temp 98.2°F | Resp 18 | Ht 60.0 in | Wt 161.0 lb

## 2016-01-12 DIAGNOSIS — M79671 Pain in right foot: Secondary | ICD-10-CM | POA: Insufficient documentation

## 2016-01-12 DIAGNOSIS — G8929 Other chronic pain: Secondary | ICD-10-CM | POA: Insufficient documentation

## 2016-01-12 DIAGNOSIS — Z9889 Other specified postprocedural states: Secondary | ICD-10-CM | POA: Diagnosis not present

## 2016-01-12 DIAGNOSIS — M4802 Spinal stenosis, cervical region: Secondary | ICD-10-CM | POA: Insufficient documentation

## 2016-01-12 DIAGNOSIS — M25551 Pain in right hip: Secondary | ICD-10-CM | POA: Insufficient documentation

## 2016-01-12 DIAGNOSIS — M79672 Pain in left foot: Secondary | ICD-10-CM | POA: Diagnosis not present

## 2016-01-12 DIAGNOSIS — E559 Vitamin D deficiency, unspecified: Secondary | ICD-10-CM | POA: Diagnosis not present

## 2016-01-12 DIAGNOSIS — M4806 Spinal stenosis, lumbar region: Secondary | ICD-10-CM | POA: Diagnosis not present

## 2016-01-12 DIAGNOSIS — M5124 Other intervertebral disc displacement, thoracic region: Secondary | ICD-10-CM | POA: Diagnosis not present

## 2016-01-12 DIAGNOSIS — M25512 Pain in left shoulder: Secondary | ICD-10-CM | POA: Insufficient documentation

## 2016-01-12 DIAGNOSIS — M5412 Radiculopathy, cervical region: Secondary | ICD-10-CM | POA: Insufficient documentation

## 2016-01-12 DIAGNOSIS — H409 Unspecified glaucoma: Secondary | ICD-10-CM | POA: Insufficient documentation

## 2016-01-12 DIAGNOSIS — M50222 Other cervical disc displacement at C5-C6 level: Secondary | ICD-10-CM | POA: Insufficient documentation

## 2016-01-12 DIAGNOSIS — M4316 Spondylolisthesis, lumbar region: Secondary | ICD-10-CM | POA: Insufficient documentation

## 2016-01-12 DIAGNOSIS — M47816 Spondylosis without myelopathy or radiculopathy, lumbar region: Secondary | ICD-10-CM | POA: Insufficient documentation

## 2016-01-12 DIAGNOSIS — L708 Other acne: Secondary | ICD-10-CM | POA: Insufficient documentation

## 2016-01-12 DIAGNOSIS — M542 Cervicalgia: Secondary | ICD-10-CM | POA: Diagnosis present

## 2016-01-12 DIAGNOSIS — G43909 Migraine, unspecified, not intractable, without status migrainosus: Secondary | ICD-10-CM | POA: Insufficient documentation

## 2016-01-12 DIAGNOSIS — M5417 Radiculopathy, lumbosacral region: Secondary | ICD-10-CM | POA: Insufficient documentation

## 2016-01-12 DIAGNOSIS — R1013 Epigastric pain: Secondary | ICD-10-CM | POA: Insufficient documentation

## 2016-01-12 DIAGNOSIS — M545 Low back pain: Secondary | ICD-10-CM | POA: Insufficient documentation

## 2016-01-12 DIAGNOSIS — M25552 Pain in left hip: Secondary | ICD-10-CM | POA: Insufficient documentation

## 2016-01-12 DIAGNOSIS — M5020 Other cervical disc displacement, unspecified cervical region: Secondary | ICD-10-CM

## 2016-01-12 DIAGNOSIS — M4722 Other spondylosis with radiculopathy, cervical region: Secondary | ICD-10-CM | POA: Diagnosis not present

## 2016-01-12 DIAGNOSIS — M25511 Pain in right shoulder: Secondary | ICD-10-CM | POA: Insufficient documentation

## 2016-01-12 DIAGNOSIS — E109 Type 1 diabetes mellitus without complications: Secondary | ICD-10-CM | POA: Insufficient documentation

## 2016-01-12 DIAGNOSIS — M170 Bilateral primary osteoarthritis of knee: Secondary | ICD-10-CM | POA: Diagnosis not present

## 2016-01-12 DIAGNOSIS — M5481 Occipital neuralgia: Secondary | ICD-10-CM | POA: Insufficient documentation

## 2016-01-12 DIAGNOSIS — I10 Essential (primary) hypertension: Secondary | ICD-10-CM | POA: Insufficient documentation

## 2016-01-12 DIAGNOSIS — M47812 Spondylosis without myelopathy or radiculopathy, cervical region: Secondary | ICD-10-CM | POA: Diagnosis not present

## 2016-01-12 MED ORDER — LACTATED RINGERS IV SOLN: 1000.0000 mL | INTRAVENOUS | Status: AC

## 2016-01-12 MED ORDER — DEXAMETHASONE SODIUM PHOSPHATE 10 MG/ML IJ SOLN
INTRAMUSCULAR | Status: AC
  Administered 2016-01-12: 09:00:00
  Filled 2016-01-12: qty 1

## 2016-01-12 MED ORDER — MIDAZOLAM HCL 5 MG/5ML IJ SOLN: 5.0000 mg | INTRAMUSCULAR | Status: DC

## 2016-01-12 MED ORDER — DEXAMETHASONE SODIUM PHOSPHATE 10 MG/ML IJ SOLN: 10.0000 mg | Freq: Once | INTRAMUSCULAR | Status: DC

## 2016-01-12 MED ORDER — FENTANYL CITRATE (PF) 100 MCG/2ML IJ SOLN: 100.0000 ug | INTRAMUSCULAR | Status: DC

## 2016-01-12 MED ORDER — LIDOCAINE HCL (PF) 1 % IJ SOLN
INTRAMUSCULAR | Status: AC
  Administered 2016-01-12: 09:00:00
  Filled 2016-01-12: qty 5

## 2016-01-12 MED ORDER — LIDOCAINE HCL (PF) 1 % IJ SOLN: 10.0000 mL | Freq: Once | INTRAMUSCULAR | Status: DC

## 2016-01-12 MED ORDER — IOPAMIDOL (ISOVUE-M 200) INJECTION 41%
INTRAMUSCULAR | Status: AC
  Administered 2016-01-12: 09:00:00
  Filled 2016-01-12: qty 10

## 2016-01-12 MED ORDER — SODIUM CHLORIDE 0.9 % IJ SOLN
INTRAMUSCULAR | Status: AC
  Administered 2016-01-12: 09:00:00
  Filled 2016-01-12: qty 10

## 2016-01-12 MED ORDER — SODIUM CHLORIDE 0.9% FLUSH: 1.0000 mL | Freq: Once | INTRAVENOUS | Status: DC

## 2016-01-12 MED ORDER — ROPIVACAINE HCL 2 MG/ML IJ SOLN
INTRAMUSCULAR | Status: AC
  Administered 2016-01-12: 09:00:00
  Filled 2016-01-12: qty 10

## 2016-01-12 MED ORDER — IOPAMIDOL (ISOVUE-M 200) INJECTION 41%
5.0000 mL | Freq: Once | INTRAMUSCULAR | Status: DC | PRN
Start: 2016-01-12 — End: 2016-02-06

## 2016-01-12 MED ORDER — ROPIVACAINE HCL 2 MG/ML IJ SOLN: 1.0000 mL | Freq: Once | INTRAMUSCULAR | Status: DC

## 2016-01-12 NOTE — Progress Notes (Signed)
Safety precautions to be maintained throughout the outpatient stay will include: orient to surroundings, keep bed in low position, maintain call bell within reach at all times, provide assistance with transfer out of bed and ambulation.  

## 2016-01-12 NOTE — Progress Notes (Addendum)
Patient's Name: Brittany Cummings MRN: [id number] DOB: 1966-07-31 DOS: 01/12/2016  Primary Reason(s) for Visit: Interventional Pain Management Treatment. CC: Neck Pain   Procedure:  Type: Diagnostic, Inter-Laminar, Epidural Steroid Injection Region: Posterior Cervico-thoracic Region Level: C7-T1 Laterality: Left-Sided Paramedial  Indications: 1. Chronic cervical radicular pain (Bilateral) (L>R)   2.  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 Cervical herniated disc (C5-6 and C6-7)   4. 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); Lumbar facet hypertrophy (Severe at L4-5); Chronic neck pain (Bilateral) (L>R); Cervicogenic headache (Left); Occipital neuralgia (Left); Chronic shoulder pain (Bilateral) (R>L); Chronic cervical radicular pain (Bilateral) (L>R); Chronic low back pain (Location of Primary Source of Pain) (Bilateral) (R>L); Lumbar facet syndrome (Location of Primary Source of Pain) (Bilateral) (R>L); Chronic knee pain (Location of Secondary source of pain) (Bilateral) (R>L); Chronic lumbar radicular pain (Left); Lumbosacral radiculopathy at L4 (Left); Chronic lower extremity pain (Left); Posture arthritis of knee (Location of Secondary source of pain) (Bilateral) (R>L); Chronic foot pain (Bilateral) (L>R); Chronic hip pain (Bilateral) (R>L); 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  Anesthesia, Analgesia, Anxiolysis:  Type: Local Anesthesia Local Anesthetic: Lidocaine 1% Route: Infiltration ([place]/IM) IV Access:  Declined Sedation: Declined  Indication(s): Analgesia     Pre-Procedure Assessment:  Brittany Cummings is a 50 y.o. year old, female patient, seen today for interventional treatment. 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); 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); Lumbar facet hypertrophy (Severe at L4-5); Chronic neck pain (Bilateral) (L>R); Cervicogenic headache (Left); Occipital neuralgia (Left); Chronic shoulder pain (Bilateral) (R>L); Chronic cervical radicular pain (Bilateral) (L>R); Chronic low back pain (Location of Primary Source of Pain) (Bilateral) (R>L); Lumbar facet syndrome (Location of Primary Source of Pain) (Bilateral) (R>L); Chronic knee pain (Location of Secondary source of pain) (Bilateral) (R>L); Chronic lumbar radicular pain (Left); Lumbosacral radiculopathy at L4 (Left); Chronic lower extremity pain (Left); Posture arthritis of knee (Location of Secondary source of pain) (Bilateral) (R>L); Chronic foot pain (Bilateral) (L>R); Chronic hip pain (Bilateral) (R>L); Recurrent major depressive disorder, in partial remission ([place]); Suicidal ideations; Clinical depression; Lumbar foraminal stenosis (Severe) (L4-5) (Left);  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 Her primarily concern today is the Neck Pain   Today's Initial Pain Score: 9/10, clinically the patient looks like a 1-2/10. The patient has no physiological signs of pain she is not tachycardic, hypertensive, tachypneic, diaphoretic, or flushed. She is not crying, she is not restless, she is not grimacing, she is not showing any facial  expressions of pain like a squeezing, trembling of the chin, she is not moaning or groaning, and she is not restless. Reported level of pain is incompatible with clinical obrservations. This may be secondary to a possible lack of understanding on how the pain scale works. Pain Type: Chronic pain Pain Location: Neck Pain Orientation: Left, Right Pain Descriptors / Indicators: Burning, Sharp Pain Frequency: Constant  Post-procedure Pain Score: 9   Date of Last Visit: 01/11/16 Service Provided on Last Visit: Evaluation  Verification of the correct person, correct site (including marking of site), and correct procedure were performed and confirmed by the patient.  Today's Vitals   01/12/16 0847 01/12/16 0853 01/12/16 0859 01/12/16 0901  BP: 128/86 131/92 112/77 123/86  Pulse: 96 102 98 101  Temp:      Resp: 18 16 16 18   Height:      Weight:      SpO2: 96% 96% 96% 96%  PainSc:    9   PainLoc:      Calculated BMI: Body mass index is 31.44 kg/(m^2). Allergies: She is allergic to meperidine.. Primary Diagnosis: Chronic radicular cervical pain [M54.12, G89.29]  Consent: Secured. Under the influence of no sedatives a written informed consent was obtained, after having provided information on the risks and possible complications. To fulfill our ethical and legal obligations, as recommended by the American Medical Association's Code of Ethics, we have provided information to the patient about our clinical impression; the nature and purpose of the treatment or procedure; the risks, benefits, and possible complications of the  intervention; alternatives; the risk(s) and benefit(s) of the alternative treatment(s) or procedure(s); and the risk(s) and benefit(s) of doing nothing. The patient was provided information about the risks and possible complications associated with the procedure. In the case of spinal procedures these may include, but are not limited to, failure to achieve desired goals, infection, bleeding, organ or nerve damage, allergic reactions, paralysis, and death. In addition, the patient was informed that Medicine is not an exact science; therefore, there is also the possibility of unforeseen risks and possible complications that may result in a catastrophic outcome. The patient indicated having understood very clearly. We have given the patient no guarantees and we have made no promises. Enough time was given to the patient to ask questions, all of which were answered to the patient's satisfaction.  Pre-Procedure Preparation: Safety Precautions: Allergies reviewed. Appropriate site, procedure, and patient were confirmed by following the Joint Commission's Universal Protocol (UP.01.01.01), in the form of a "Time Out". The patient was asked to confirm marked site and procedure, before commencing. The patient was asked about blood thinners, or active infections, both of which were denied. Patient was assessed for positional comfort and all pressure points were checked before starting procedure. Monitoring:  As per clinic protocol. Blood pressure monitor, ECG, Pulse Oxymetry, & Clinical Monitoring. Infection Control Precautions: Sterile technique used. Standard Universal Precautions were taken as recommended by the Department of [facility] for Disease Control and Prevention (CDC). Standard pre-surgical skin prep was conducted. Respiratory hygiene and cough etiquette was practiced. Hand hygiene observed. Safe injection practices and needle disposal techniques followed. SDV (single dose vial) medications used.  Medications properly checked for expiration dates and contaminants. Personal protective equipment (PPE) used: Surgical mask. Sterile double glove technique. Radiation resistant gloves. Sterile surgical gloves.  Description of Procedure Process:   Time-out: "Time-out" completed before starting procedure, as per protocol. Position: Prone with head of the table was raised to facilitate breathing.  Target Area: For Epidural Steroid injections the target is the interlaminar space, initially targeting the lower border of the superior vertebral body lamina. Approach: Paramedial approach. Area Prepped: Entire PosteriorCervical Region Prepping solution: ChloraPrep (2% chlorhexidine gluconate and 70% isopropyl alcohol) Safety Precautions: Aspiration looking for blood return was conducted prior to all injections. At no point did we inject any substances, as a needle was being advanced. No attempts were made at seeking any paresthesias. Safe injection practices and needle disposal techniques used. Medications properly checked for expiration dates. SDV (single dose vial) medications used.   Description of the Procedure: Protocol guidelines were followed. The procedure needle was introduced through the skin, ipsilateral to the reported pain, and advanced to the target area. Bone was contacted and the needle walked caudad, until the lamina was cleared. The epidural space was identified using "loss-of-resistance technique" with 2-3 ml of PF-NaCl (0.9% NSS), in a 5cc LOR glass syringe. EBL: None Materials & Medications Used:  Needle(s) Used: 20g - 10cm, Tuohy-style epidural needle Medication(s): see below.  Imaging Guidance:  Type of Imaging Technique: Fluoroscopy Guidance (Spinal) Indication(s): Assistance in needle guidance and placement for procedures requiring needle placement in or near specific anatomical locations not easily accessible without such assistance. Exposure Time: Please see nurses  notes. Contrast: Before injecting any contrast, we confirmed that the patient did not have an allergy to iodine, shellfish, or radiological contrast. Once satisfactory needle placement was completed at the desired level, radiological contrast was injected. Injection was conducted under continuous fluoroscopic guidance. Injection of contrast accomplished without complications. See chart for type and volume of contrast used. Fluoroscopic Guidance: I was personally present in the fluoroscopy suite, where the patient was placed in position for the procedure, over the fluoroscopy-compatible table. Fluoroscopy was manipulated, using "Tunnel Vision Technique", to obtain the best possible view of the target area, on the affected side. Parallax error was corrected before commencing the procedure. A "direction-depth-direction" technique was used to introduce the needle under continuous pulsed fluoroscopic guidance. Once the target was reached, antero-posterior, oblique, and lateral fluoroscopic projection views were taken to confirm needle placement in all planes. Permanently recorded images stored by scanning into EMR. Interpretation: Intraoperative imaging interpretation by performing Physician. Adequate needle placement confirmed. Needle placement confirmed in AP & Oblique Views. Appropriate spread of contrast to desired area. No evidence of afferent or efferent intravascular uptake. No intrathecal or subarachnoid spread observed. Permanent hardcopy images in multiple planes scanned into the patient's record.  Antibiotic Prophylaxis:  Indication(s): No indications identified. Type:  Antibiotics Given (last 72 hours)    None       Post-operative Assessment:  Complications: No immediate post-treatment complications observed. Disposition: The patient was discharged home, once institutional criteria were met. Return to clinic in 2 weeks for follow-up evaluation and interpretation of results. The patient  tolerated the entire procedure well. A repeat set of vitals were taken after the procedure and the patient was kept under observation following institutional policy, for this type of procedure. Post-procedural neurological assessment was performed, showing return to baseline, prior to discharge. The patient was provided with post-procedure discharge instructions, including a section on how to identify potential problems. Should any problems arise concerning this procedure, the patient was given instructions to immediately contact us, at any time, without hesitation. In any case, we plan to contact the patient by telephone for a follow-up status report regarding this interventional procedure. Comments:  No additional relevant information.  Medications administered during this visit: We administered ropivacaine (PF)  2 mg/ml (0.2%), iopamidol, lidocaine (PF), sodium chloride, and dexamethasone.  Prescriptions ordered during this visit: New Prescriptions   No medications on file    Future Appointments Date Time Provider [place]  01/17/2016 8:00 AM [facility]  02/06/2016 7:40 AM Milinda Pointer, MD [facility] None    Primary Care Physician: Rusty Aus, MD Location: [facility] Note by: Kathlen Brunswick. Dossie Arbour, M.D, DABA, DABAPM, DABPM, DABIPP, FIPP  Disclaimer:  Medicine is not an exact science. The only guarantee in medicine is that nothing is guaranteed. It is important to note that the decision to proceed with this intervention was based on the information collected from the patient. The Data and conclusions were drawn from the patient's questionnaire, the interview, and the physical examination. Because the information was provided in large part by the patient, it cannot be guaranteed that it has not been purposely or unconsciously manipulated. Every effort has been made to obtain as much relevant data as possible for this evaluation. It is  important to note that the conclusions that lead to this procedure are derived in large part from the available data. Always take into account that the treatment will also be dependent on availability of resources and existing treatment guidelines, considered by other Pain Management Practitioners as being common knowledge and practice, at the time of the intervention. For Medico-Legal purposes, it is also important to point out that variation in procedural techniques and pharmacological choices are the acceptable norm. The indications, contraindications, technique, and results of the above procedure should only be interpreted and judged by a Board-Certified Interventional Pain Specialist with extensive familiarity and expertise in the same exact procedure and technique. Attempts at providing opinions without similar or greater experience and expertise than that of the treating physician will be considered as inappropriate and unethical, and shall result in a formal complaint to the state medical board and applicable specialty societies.

## 2016-01-12 NOTE — Patient Instructions (Addendum)
Smoking Cessation, Tips for Success If you are ready to quit smoking, congratulations! You have chosen to help yourself be healthier. Cigarettes bring nicotine, tar, carbon monoxide, and other irritants into your body. Your lungs, heart, and blood vessels will be able to work better without these poisons. There are many different ways to quit smoking. Nicotine gum, nicotine patches, a nicotine inhaler, or nicotine nasal spray can help with physical craving. Hypnosis, support groups, and medicines help break the habit of smoking. WHAT THINGS CAN I DO TO MAKE QUITTING EASIER?  Here are some tips to help you quit for good: 1. Pick a date when you will quit smoking completely. Tell all of your friends and family about your plan to quit on that date. 2. Do not try to slowly cut down on the number of cigarettes you are smoking. Pick a quit date and quit smoking completely starting on that day. 3. Throw away all cigarettes.  4. Clean and remove all ashtrays from your home, work, and car. 5. On a card, write down your reasons for quitting. Carry the card with you and read it when you get the urge to smoke. 6. Cleanse your body of nicotine. Drink enough water and fluids to keep your urine clear or pale yellow. Do this after quitting to flush the nicotine from your body. 7. Learn to predict your moods. Do not let a bad situation be your excuse to have a cigarette. Some situations in your life might tempt you into wanting a cigarette. 8. Never have "just one" cigarette. It leads to wanting another and another. Remind yourself of your decision to quit. 9. Change habits associated with smoking. If you smoked while driving or when feeling stressed, try other activities to replace smoking. Stand up when drinking your coffee. Brush your teeth after eating. Sit in a different chair when you read the paper. Avoid alcohol while trying to quit, and try to drink fewer caffeinated beverages. Alcohol and caffeine may urge you  to smoke. 10. Avoid foods and drinks that can trigger a desire to smoke, such as sugary or spicy foods and alcohol. 11. Ask people who smoke not to smoke around you. 12. Have something planned to do right after eating or having a cup of coffee. For example, plan to take a walk or exercise. 13. Try a relaxation exercise to calm you down and decrease your stress. Remember, you may be tense and nervous for the first 2 weeks after you quit, but this will pass. 14. Find new activities to keep your hands busy. Play with a pen, coin, or rubber band. Doodle or draw things on paper. 15. Brush your teeth right after eating. This will help cut down on the craving for the taste of tobacco after meals. You can also try mouthwash.  16. Use oral substitutes in place of cigarettes. Try using lemon drops, carrots, cinnamon sticks, or chewing gum. Keep them handy so they are available when you have the urge to smoke. 17. When you have the urge to smoke, try deep breathing. 18. Designate your home as a nonsmoking area. 19. If you are a heavy smoker, ask your health care provider about a prescription for nicotine chewing gum. It can ease your withdrawal from nicotine. 20. Reward yourself. Set aside the cigarette money you save and buy yourself something nice. 21. Look for support from others. Join a support group or smoking cessation program. Ask someone at home or at work to help you with your plan   to quit smoking. 22. Always ask yourself, "Do I need this cigarette or is this just a reflex?" Tell yourself, "Today, I choose not to smoke," or "I do not want to smoke." You are reminding yourself of your decision to quit. 23. Do not replace cigarette smoking with electronic cigarettes (commonly called e-cigarettes). The safety of e-cigarettes is unknown, and some may contain harmful chemicals. 24. If you relapse, do not give up! Plan ahead and think about what you will do the next time you get the urge to smoke. HOW WILL  I FEEL WHEN I QUIT SMOKING? You may have symptoms of withdrawal because your body is used to nicotine (the addictive substance in cigarettes). You may crave cigarettes, be irritable, feel very hungry, cough often, get headaches, or have difficulty concentrating. The withdrawal symptoms are only temporary. They are strongest when you first quit but will go away within 10-14 days. When withdrawal symptoms occur, stay in control. Think about your reasons for quitting. Remind yourself that these are signs that your body is healing and getting used to being without cigarettes. Remember that withdrawal symptoms are easier to treat than the major diseases that smoking can cause.  Even after the withdrawal is over, expect periodic urges to smoke. However, these cravings are generally short lived and will go away whether you smoke or not. Do not smoke! WHAT RESOURCES ARE AVAILABLE TO HELP ME QUIT SMOKING? Your health care provider can direct you to community resources or hospitals for support, which may include: 1. Group support. 2. Education. 3. Hypnosis. 4. Therapy.   This information is not intended to replace advice given to you by your health care provider. Make sure you discuss any questions you have with your health care provider.   Document Released: 06/29/2004 Document Revised: 10/22/2014 Document Reviewed: 03/19/2013 [employer] Interactive Patient Education 2016 [employer] Pain Management Discharge Instructions  General Discharge Instructions :  If you need to reach your doctor call: Monday-Friday 8:00 am - 4:00 pm at [phone] or toll free [phone].  After clinic hours [phone] to have operator reach doctor.  Bring all of your medication bottles to all your appointments in the pain clinic.  To cancel or reschedule your appointment with Pain Management please remember to call 24 hours in advance to avoid a fee.  Refer to the educational materials which you have been given on:  General Risks, I had my Procedure. Discharge Instructions, Post Sedation.  Post Procedure Instructions:  The drugs you were given will stay in your system until tomorrow, so for the next 24 hours you should not drive, make any legal decisions or drink any alcoholic beverages.  You may eat anything you prefer, but it is better to start with liquids then soups and crackers, and gradually work up to solid foods.  Please notify your doctor immediately if you have any unusual bleeding, trouble breathing or pain that is not related to your normal pain.  Depending on the type of procedure that was done, some parts of your body may feel week and/or numb.  This usually clears up by tonight or the next day.  Walk with the use of an assistive device or accompanied by an adult for the 24 hours.  You may use ice on the affected area for the first 24 hours.  Put ice in a Ziploc bag and cover with a towel and place against area 15 minutes on 15 minutes off.  You may switch to heat after 24 hours.Epidural Steroid Injection  Patient Information  Description: The epidural space surrounds the nerves as they exit the spinal cord.  In some patients, the nerves can be compressed and inflamed by a bulging disc or a tight spinal canal (spinal stenosis).  By injecting steroids into the epidural space, we can bring irritated nerves into direct contact with a potentially helpful medication.  These steroids act directly on the irritated nerves and can reduce swelling and inflammation which often leads to decreased pain.  Epidural steroids may be injected anywhere along the spine and from the neck to the low back depending upon the location of your pain.   After numbing the skin with local anesthetic (like Novocaine), a small needle is passed into the epidural space slowly.  You may experience a sensation of pressure while this is being done.  The entire block usually last less than 10 minutes.  Conditions which may be  treated by epidural steroids:   Low back and leg pain  Neck and arm pain  Spinal stenosis  Post-laminectomy syndrome  Herpes zoster (shingles) pain  Pain from compression fractures  Preparation for the injection:  5. Do not eat any solid food or dairy products within 8 hours of your appointment.  6. You may drink clear liquids up to 3 hours before appointment.  Clear liquids include water, black coffee, juice or soda.  No milk or cream please. 7. You may take your regular medication, including pain medications, with a sip of water before your appointment  Diabetics should hold regular insulin (if taken separately) and take 1/2 normal NPH dos the morning of the procedure.  Carry some sugar containing items with you to your appointment. 8. A driver must accompany you and be prepared to drive you home after your procedure.  9. Bring all your current medications with your. 10. An IV may be inserted and sedation may be given at the discretion of the physician.   11. A blood pressure cuff, EKG and other monitors will often be applied during the procedure.  Some patients may need to have extra oxygen administered for a short period. 12. You will be asked to provide medical information, including your allergies, prior to the procedure.  We must know immediately if you are taking blood thinners (like Coumadin/Warfarin)  Or if you are allergic to IV iodine contrast (dye). We must know if you could possible be pregnant.  Possible side-effects:  Bleeding from needle site  Infection (rare, may require surgery)  Nerve injury (rare)  Numbness & tingling (temporary)  Difficulty urinating (rare, temporary)  Spinal headache ( a headache worse with upright posture)  Light -headedness (temporary)  Pain at injection site (several days)  Decreased blood pressure (temporary)  Weakness in arm/leg (temporary)  Pressure sensation in back/neck (temporary)  Call if you  experience:  Fever/chills associated with headache or increased back/neck pain.  Headache worsened by an upright position.  New onset weakness or numbness of an extremity below the injection site  Hives or difficulty breathing (go to the emergency room)  Inflammation or drainage at the infection site  Severe back/neck pain  Any new symptoms which are concerning to you  Please note:  Although the local anesthetic injected can often make your back or neck feel good for several hours after the injection, the pain will likely return.  It takes 3-7 days for steroids to work in the epidural space.  You may not notice any pain relief for at least that one week.  If effective,  we will often do a series of three injections spaced 3-6 weeks apart to maximally decrease your pain.  After the initial series, we generally will wait several months before considering a repeat injection of the same type.  If you have any questions, please call [phone] [facility] 

## 2016-01-12 NOTE — Progress Notes (Signed)
Patient saw Dr Emily Filbert yesterday and he changed her depression medications.

## 2016-01-13 ENCOUNTER — Telehealth: Payer: Self-pay | Admitting: *Deleted

## 2016-01-13 NOTE — Telephone Encounter (Signed)
Left message for post procedure call back

## 2016-01-15 LAB — TOXASSURE SELECT 13 (MW), URINE: PDF: 0

## 2016-01-17 ENCOUNTER — Ambulatory Visit
Admission: RE | Admit: 2016-01-17 | Discharge: 2016-01-17 | Disposition: A | Discharge status: Home / Self Care | Payer: Medicare Other | Source: Ambulatory Visit | Attending: Pain Medicine | Admitting: Pain Medicine

## 2016-01-17 DIAGNOSIS — G8929 Other chronic pain: Secondary | ICD-10-CM | POA: Insufficient documentation

## 2016-01-17 DIAGNOSIS — M5481 Occipital neuralgia: Secondary | ICD-10-CM | POA: Diagnosis not present

## 2016-01-17 DIAGNOSIS — M25512 Pain in left shoulder: Secondary | ICD-10-CM | POA: Insufficient documentation

## 2016-01-17 DIAGNOSIS — R51 Headache: Secondary | ICD-10-CM | POA: Insufficient documentation

## 2016-01-17 DIAGNOSIS — M47812 Spondylosis without myelopathy or radiculopathy, cervical region: Secondary | ICD-10-CM | POA: Insufficient documentation

## 2016-01-17 DIAGNOSIS — M5412 Radiculopathy, cervical region: Secondary | ICD-10-CM | POA: Insufficient documentation

## 2016-01-17 DIAGNOSIS — M5030 Other cervical disc degeneration, unspecified cervical region: Secondary | ICD-10-CM | POA: Diagnosis not present

## 2016-01-17 DIAGNOSIS — G4486 Cervicogenic headache: Secondary | ICD-10-CM

## 2016-01-17 DIAGNOSIS — M25511 Pain in right shoulder: Secondary | ICD-10-CM

## 2016-01-17 DIAGNOSIS — M542 Cervicalgia: Secondary | ICD-10-CM

## 2016-01-18 NOTE — Progress Notes (Signed)
Quick Note:  NOTE: This forensic urine drug screen (UDS) test was conducted using a state-of-the-art ultra high performance liquid chromatography and mass spectrometry system (UPLC/MS-MS), the most sophisticated and accurate method available. UPLC/MS-MS is 1,000 times more precise and accurate than standard gas chromatography and mass spectrometry (GC/MS). This system can analyze 26 drug categories and 180 drug compounds.  The results of this test came back with unexpected findings. No hydrocodone found ______

## 2016-01-18 NOTE — Progress Notes (Signed)
Quick Note:  The results of this diagnostic imaging were reviewed and found to be mildly abnormal. No acute injury or pathology identified. Consider interventional pain management techniques. The patient may be a good candidate for either cervical epidural steroid injections and/or cervical facet blocks. ______

## 2016-02-06 ENCOUNTER — Ambulatory Visit: Payer: Medicare Other | Attending: Pain Medicine | Admitting: Pain Medicine

## 2016-02-06 ENCOUNTER — Encounter: Payer: Self-pay | Admitting: Pain Medicine

## 2016-02-06 VITALS — BP 154/87 | HR 89 | Temp 99.4°F | Resp 16 | Ht 63.0 in | Wt 161.0 lb

## 2016-02-06 DIAGNOSIS — M5481 Occipital neuralgia: Secondary | ICD-10-CM | POA: Insufficient documentation

## 2016-02-06 DIAGNOSIS — M25562 Pain in left knee: Secondary | ICD-10-CM | POA: Diagnosis not present

## 2016-02-06 DIAGNOSIS — F17210 Nicotine dependence, cigarettes, uncomplicated: Secondary | ICD-10-CM | POA: Diagnosis not present

## 2016-02-06 DIAGNOSIS — M47816 Spondylosis without myelopathy or radiculopathy, lumbar region: Secondary | ICD-10-CM | POA: Insufficient documentation

## 2016-02-06 DIAGNOSIS — M25511 Pain in right shoulder: Secondary | ICD-10-CM | POA: Insufficient documentation

## 2016-02-06 DIAGNOSIS — Z9889 Other specified postprocedural states: Secondary | ICD-10-CM | POA: Insufficient documentation

## 2016-02-06 DIAGNOSIS — E109 Type 1 diabetes mellitus without complications: Secondary | ICD-10-CM | POA: Diagnosis not present

## 2016-02-06 DIAGNOSIS — M25512 Pain in left shoulder: Secondary | ICD-10-CM | POA: Diagnosis not present

## 2016-02-06 DIAGNOSIS — M4806 Spinal stenosis, lumbar region: Secondary | ICD-10-CM | POA: Insufficient documentation

## 2016-02-06 DIAGNOSIS — G8929 Other chronic pain: Secondary | ICD-10-CM

## 2016-02-06 DIAGNOSIS — Z981 Arthrodesis status: Secondary | ICD-10-CM | POA: Insufficient documentation

## 2016-02-06 DIAGNOSIS — M4316 Spondylolisthesis, lumbar region: Secondary | ICD-10-CM | POA: Diagnosis not present

## 2016-02-06 DIAGNOSIS — M25551 Pain in right hip: Secondary | ICD-10-CM | POA: Diagnosis not present

## 2016-02-06 DIAGNOSIS — F338 Other recurrent depressive disorders: Secondary | ICD-10-CM | POA: Diagnosis not present

## 2016-02-06 DIAGNOSIS — Z9884 Bariatric surgery status: Secondary | ICD-10-CM | POA: Insufficient documentation

## 2016-02-06 DIAGNOSIS — H409 Unspecified glaucoma: Secondary | ICD-10-CM | POA: Insufficient documentation

## 2016-02-06 DIAGNOSIS — Z79891 Long term (current) use of opiate analgesic: Secondary | ICD-10-CM | POA: Insufficient documentation

## 2016-02-06 DIAGNOSIS — E559 Vitamin D deficiency, unspecified: Secondary | ICD-10-CM | POA: Insufficient documentation

## 2016-02-06 DIAGNOSIS — K219 Gastro-esophageal reflux disease without esophagitis: Secondary | ICD-10-CM | POA: Insufficient documentation

## 2016-02-06 DIAGNOSIS — E1140 Type 2 diabetes mellitus with diabetic neuropathy, unspecified: Secondary | ICD-10-CM | POA: Insufficient documentation

## 2016-02-06 DIAGNOSIS — M5124 Other intervertebral disc displacement, thoracic region: Secondary | ICD-10-CM | POA: Diagnosis not present

## 2016-02-06 DIAGNOSIS — M542 Cervicalgia: Secondary | ICD-10-CM | POA: Diagnosis present

## 2016-02-06 DIAGNOSIS — M170 Bilateral primary osteoarthritis of knee: Secondary | ICD-10-CM | POA: Insufficient documentation

## 2016-02-06 DIAGNOSIS — R45851 Suicidal ideations: Secondary | ICD-10-CM | POA: Diagnosis not present

## 2016-02-06 DIAGNOSIS — L708 Other acne: Secondary | ICD-10-CM | POA: Insufficient documentation

## 2016-02-06 DIAGNOSIS — I10 Essential (primary) hypertension: Secondary | ICD-10-CM | POA: Diagnosis not present

## 2016-02-06 DIAGNOSIS — M50222 Other cervical disc displacement at C5-C6 level: Secondary | ICD-10-CM | POA: Insufficient documentation

## 2016-02-06 DIAGNOSIS — M25561 Pain in right knee: Secondary | ICD-10-CM | POA: Insufficient documentation

## 2016-02-06 DIAGNOSIS — M25552 Pain in left hip: Secondary | ICD-10-CM | POA: Insufficient documentation

## 2016-02-06 DIAGNOSIS — M545 Low back pain: Secondary | ICD-10-CM | POA: Diagnosis not present

## 2016-02-06 DIAGNOSIS — R1013 Epigastric pain: Secondary | ICD-10-CM | POA: Diagnosis not present

## 2016-02-06 DIAGNOSIS — R51 Headache: Secondary | ICD-10-CM | POA: Insufficient documentation

## 2016-02-06 DIAGNOSIS — M47812 Spondylosis without myelopathy or radiculopathy, cervical region: Secondary | ICD-10-CM | POA: Insufficient documentation

## 2016-02-06 DIAGNOSIS — K259 Gastric ulcer, unspecified as acute or chronic, without hemorrhage or perforation: Secondary | ICD-10-CM | POA: Insufficient documentation

## 2016-02-06 DIAGNOSIS — M549 Dorsalgia, unspecified: Secondary | ICD-10-CM | POA: Diagnosis present

## 2016-02-06 NOTE — Progress Notes (Signed)
Patient's Name: Brittany Cummings  Patient type: Established  MRN: [id number]  Service setting: Ambulatory outpatient  DOB: 05/23/1966  Location: [facility]  DOS: 02/06/2016  Primary Care Physician: Rusty Aus, MD  Note by: Kathlen Brunswick. Dossie Arbour, M.D, DABA, DABAPM, DABPM, DABIPP, FIPP  Referring Physician: Rusty Aus, MD  Specialty: Board-Certified Interventional Pain Management     Primary Reason(s) for Visit: Encounter for post-procedure evaluation of chronic illness with mild to moderate exacerbation CC: Back Pain and Neck Pain   HPI  Brittany Cummings is a 49 y.o. year old, female patient, who returns today as an established patient. 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); 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); Lumbar facet hypertrophy (Severe at L4-5); Chronic neck pain (Bilateral) (L>R); Cervicogenic headache (Left); Occipital neuralgia (Left); Chronic shoulder pain (Bilateral) (R>L); Chronic cervical radicular pain (Bilateral) (L>R); Chronic low back pain (Location of Primary Source of Pain) (Bilateral) (R>L); Lumbar facet syndrome (Location of Primary Source of Pain) (Bilateral) (R>L); Chronic knee pain (Location of Secondary source of pain) (Bilateral) (R>L); Chronic lumbar radicular pain (Left); Lumbosacral radiculopathy at L4 (Left); Chronic lower extremity pain (Left); Posture arthritis of  knee (Location of Secondary source of pain) (Bilateral) (R>L); Chronic foot pain (Bilateral) (L>R); Chronic hip pain (Bilateral) (R>L); Recurrent major depressive disorder, in partial remission ([place]); Suicidal ideations; Clinical depression; Lumbar foraminal stenosis (Severe) (L4-5) (Left);  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 Her primarily concern today is the Back Pain and Neck Pain   Pain Assessment: Self-Reported Pain Score: 9 , clinically she looks like a 2/10. Reported level is inconsistent with clinical obrservations Pain Type: Chronic pain Pain Location: Back Pain Orientation: Lower, Left, Right Pain Descriptors / Indicators: Aching, Constant Pain Frequency: Constant  The patient indicates that the pain that she used to have in the neck is 50% gone. Her primary pain now is that of the lower back and she is pending to see a neurosurgeon for that. She already had a fusion for this. Apparently she had a diagnostic bilateral lumbar facet block on under fluoroscopic guidance without sedation that she indicates hurt her more than what it helped. Because of this now she is very afraid of repeating the injection, but she is beginning to realize the benefits of forward offering her. She has decided to go ahead and have this injection done to determine if what she has in the lumbar spine is that of a lumbar facet syndrome. If it is, further surgery may not be beneficial unless she has developed another facet cyst.  Date of Last Visit: 01/12/16 Service Provided on Last Visit: Procedure (cervical epidural)  Post-Procedure Assessment  Procedure done on last visit: Left-sided C7-T1 cervical epidural steroid injection #1 under fluoroscopic guidance and IV sedation. Side-effects or Adverse reactions:  None reported Sedation: Sedation given  Results: Ultra-Short Term Relief (First 1 hour  after procedure): 100 %  Analgesia during this period is likely to be Local Anesthetic and/or IV Sedative (Analgesic/Anxiolitic) related Short Term Relief (Initial 4-6 hrs after procedure): 100 % (Lasted x 5 days, after that, the pain started comming back.) Complete relief confirms area to be the source of pain Long Term Relief : 50 % (50% better than before. Still enjoying the relief. Now the l;ower back is the worse.) Long-term benefit would suggest an inflammatory etiology to the pain   Current Relief (Now): 50% better than she was before.  Persistent relief would suggest effective anti-inflammatory effects from steroids Interpretation of Results: She continues to enjoy relief of the neck pain after the cervical epidural steroid injection. At this point, her primary pain is that of the lower back. We will put the repeat cervical epidural steroid injections on hold until we can decrease some of her low back pain.  Laboratory Chemistry  Inflammation Markers Lab Results  Component Value Date   ESRSEDRATE 2 12/13/2015   CRP 1.5* 12/13/2015    Renal Function Lab Results  Component Value Date   BUN 12 12/13/2015   CREATININE 0.58 12/13/2015   GFRAA >60 12/13/2015   GFRNONAA >60 12/13/2015    Hepatic Function Lab Results  Component Value Date   AST 23 12/13/2015   ALT 12* 12/13/2015   ALBUMIN 4.1 12/13/2015    Electrolytes Lab Results  Component Value Date   NA 137 12/13/2015   K 4.4 12/13/2015   CL 104 12/13/2015   CALCIUM 9.0 12/13/2015   MG 2.1 12/13/2015    Pain Modulating Vitamins No results found for: VD25OH, [id number], [id number], [id number], VITAMINB12  Coagulation Parameters Lab Results  Component Value Date   INR 0.9 06/18/2012   LABPROT 12.9 06/18/2012    Note: I personally reviewed the above data. Results shared with patient.  Meds  The patient has a current  medication list which includes the following prescription(s): amlodipine-olmesartan, azelastine, butorphanol, calcium carb-cholecalciferol, cyclobenzaprine, diclofenac sodium, duloxetine, estradiol-norethindrone, fluticasone, gabapentin, glimepiride, hydrocodone-acetaminophen, insulin nph-regular human, metoprolol succinate, multi-vitamins, oxycodone hcl, pantoprazole, paroxetine, quetiapine, and sucralfate.  Current Outpatient Prescriptions on File Prior to Visit  Medication Sig  . amLODipine-olmesartan (AZOR) 10-40 MG tablet Take 1 tablet by mouth daily.   Marland Kitchen azelastine (ASTELIN) 0.1 % nasal spray Place 1 spray into both nostrils 2 (two) times daily. Use in each nostril as directed  . butorphanol (STADOL) 10 MG/ML nasal spray PLACE 1 SPRAY INTO THE LEFT NOSTRIL AS DIRECTED  . Calcium Carb-Cholecalciferol (CALCIUM 600/VITAMIN D3) 600-800 MG-UNIT TABS Take 1 tablet by mouth daily.   . cyclobenzaprine (FLEXERIL) 10 MG tablet Take 1 tablet (10 mg total) by mouth 3 (three) times daily as needed for muscle spasms.  . diclofenac sodium (VOLTAREN) 1 % GEL Apply 2 g topically 4 (four) times daily.   Marland Kitchen estradiol-norethindrone (MIMVEY) 1-0.5 MG tablet Take 1 tablet by mouth daily.   . fluticasone (FLONASE) 50 MCG/ACT nasal spray Place 2 sprays into both nostrils daily.  Marland Kitchen gabapentin (NEURONTIN) 300 MG capsule Take 300 mg by mouth at bedtime.   Marland Kitchen glimepiride (AMARYL) 4 MG tablet Take 4 mg by mouth daily with breakfast.   . HYDROcodone-acetaminophen (NORCO/VICODIN) 5-325 MG tablet Take 1 tablet by mouth 2 (two) times daily.  . insulin NPH-regular Human (NOVOLIN 70/30) (70-30) 100 UNIT/ML injection Inject 40 Units into the skin 2 (two) times daily.   . metoprolol succinate (TOPROL-XL) 100 MG 24 hr tablet Take 100 mg by mouth daily.   Marland Kitchen  Multiple Vitamin (MULTI-VITAMINS) TABS Take 1 tablet by mouth daily.   . pantoprazole (PROTONIX) 40 MG tablet Take 40 mg by mouth daily.   Marland Kitchen PARoxetine (PAXIL) 20 MG tablet Take  by mouth.  . QUEtiapine (SEROQUEL) 100 MG tablet Take 100 mg by mouth at bedtime.   . sucralfate (CARAFATE) 1 G tablet Take 1 g by mouth 4 (four) times daily -  with meals and at bedtime.   No current facility-administered medications on file prior to visit.    ROS  Constitutional: Afebrile, no chills, well hydrated and well nourished Gastrointestinal: No upper or lower GI bleeding, no nausea, no vomiting and no acute GI distress Musculoskeletal: No acute joint swelling or redness, no acute loss of range of motion and no acute onset weakness Neurological: Denies any acute onset apraxia, no episodes of paralysis, no acute loss of coordination, no acute loss of consciousness and no acute onset aphasia, dysarthria, agnosia, or amnesia  Allergies  Ms. Cuadra is allergic to meperidine.  [place]  Medical:  Ms. Silberman  has a past medical history of Spondylolisthesis; Diabetes mellitus without complication ([place]); Hypertension; Spinal headache; Depression; Seasonal allergies; Diabetic neuropathy ([place]); GERD (gastroesophageal reflux disease); Multiple gastric ulcers; Arthritis; Rib fracture; Cervical disc disease (02/08/2014); and Spondylolisthesis of lumbar region (09/15/2015). Family: family history includes Cancer in her mother; Cancer - Colon in her father; Diabetes in her other; Hypertension in her other; Stroke in her other. Surgical:  has past surgical history that includes Gastric bypass; Back surgery; Breast reduction surgery; Appendectomy; Carpal tunnel release; Joint replacement; Knee arthroscopy; Cataract extraction; abdominal abscess excision; Diagnostic laparoscopy; Cholecystectomy; Hernia repair; Colonoscopy w/ biopsies and polypectomy; Wisdom tooth extraction; and Spinal fusion (09-15-2015). Tobacco:  reports that she has been smoking Cigarettes.  She has a 25 pack-year smoking history. She has never used smokeless tobacco. Alcohol:  reports that she does not drink alcohol. Drug:  reports that  she does not use illicit drugs.  Physical Examination  Constitutional Vitals: Blood pressure 154/87, pulse 89, temperature 99.4 F (37.4 C), temperature source Oral, resp. rate 16, height 5\' 3"  (1.6 m), weight 161 lb (73.029 kg), last menstrual period 11/13/2009, SpO2 100 %. Calculated BMI: Body mass index is 28.53 kg/(m^2). (25-29.9 kg/m2) Overweight - 20% higher incidence of chronic pain General appearance: alert, cooperative, oriented, in no distress, overweight, well nourished and well hydrated Eyes: PERLA Respiratory: No evidence respiratory distress, no audible rales or ronchi and no use of accessory muscles of respiration Psych: Alert, oriented to person, oriented to place and oriented to time  Cervical Spine Exam  Inspection: Normal anatomy, no anomalies observed Cervical Lordosis: Normal Alignment: Symetrical Functional ROM: Within functional limits (WFL) AROM: WFL Sensory: No sensory abnormalities reported  Upper Extremity Exam    Right  Left  Inspection: No gross anomalies detected  Inspection: No gross anomalies detected  Functional ROM: Adequate  Functional ROM: Adequate  AROM: Adequate  AROM: Adequate  Sensory: Normal  No sensory abnormalities reported  Sensory: Normal  No sensory abnormalities reported  Motor: Unremarkable  Motor: Unremarkable  Vascular: Normal skin color, temperature, and hair growth. No peripheral edema or cyanosis  Vascular: Normal skin color, temperature, and hair growth. No peripheral edema or cyanosis   Thoracic Spine  Inspection: No gross anomalies detected Alignment: Symetrical Functional ROM: Within functional limits [facility]) AROM: Adequate Palpation: WNL  Lumbar Spine  Inspection: Clear evidence of a prior lumbar surgery with well-healed scars. Alignment: Symetrical Functional ROM: Limited AROM: Decreased Palpation: Tender Provocative  Tests: Lumbar Hyperextension and rotation test: Positive bilateral for lumbar facet pain. Patrick's  Maneuver: deferred  Gait Assessment  Gait: WNL  Lower Extremities    Right  Left  Inspection: No gross anomalies detected  Inspection: No gross anomalies detected  Functional ROM: Within functional limits [facility])  Functional ROM: Within functional limits (WFL)  AROM: Adequate  AROM: Adequate  Sensory: Normal  Sensory: Normal  Motor: Unremarkable  Motor: Unremarkable  Toe walk (S1): WNL  Toe walk (S1): WNL  Heal walk (L5): WNL  Heal walk (L5): WNL  Pulses: Palpable  Pulses: Palpable  DTR:   DTR:   Patellar (L4): WNL  Patellar (L4): WNL  Achilles (S1): WNL  Achilles (S1): WNL    Assessment & Plan  Primary Diagnosis & Pertinent Problem List: The primary encounter diagnosis was Lumbar facet syndrome (Location of Primary Source of Pain) (Bilateral) (R>L). Diagnoses of Lumbar spondylosis, unspecified spinal osteoarthritis, Chronic low back pain (Location of Primary Source of Pain) (Bilateral) (R>L), and Chronic pain were also pertinent to this visit.  Visit Diagnosis: 1. Lumbar facet syndrome (Location of Primary Source of Pain) (Bilateral) (R>L)   2. Lumbar spondylosis, unspecified spinal osteoarthritis   3. Chronic low back pain (Location of Primary Source of Pain) (Bilateral) (R>L)   4. Chronic pain     Problem-specific Plan(s): No problem-specific assessment & plan notes found for this encounter.   Plan of Care   Problem List Items Addressed This Visit      High   Chronic low back pain (Location of Primary Source of Pain) (Bilateral) (R>L) (Chronic)   Relevant Medications   Oxycodone HCl 10 MG TABS   Chronic pain (Chronic)   Relevant Medications   DULoxetine (CYMBALTA) 60 MG capsule   Oxycodone HCl 10 MG TABS   Lumbar facet syndrome (Location of Primary Source of Pain) (Bilateral) (R>L) - Primary (Chronic)   Relevant Medications   Oxycodone HCl 10 MG TABS   Other Relevant Orders   LUMBAR FACET(MEDIAL BRANCH NERVE BLOCK) MBNB   Lumbar spondylosis (Chronic)   Relevant  Medications   Oxycodone HCl 10 MG TABS       Pharmacotherapy (Medications Ordered): No orders of the defined types were placed in this encounter.    Lab-work & Procedure Ordered: Orders Placed This Encounter  Procedures  . LUMBAR FACET(MEDIAL BRANCH NERVE BLOCK) MBNB    Standing Status: Future     Number of Occurrences:      Standing Expiration Date: 02/05/2017    Scheduling Instructions:     Side: Bilateral     Level: L2, L3, L4, L5, & S1 Medial Branch Nerve     Sedation: With Sedation.     Timeframe: ASAA    Order Specific Question:  Where will this procedure be performed?    Answer:  [facility]    Imaging Ordered: None  Interventional Therapies: Scheduled:  Diagnostic  Lumbar bilateral facet block under fluoroscopic guidance and IV sedation.    Considering:  Radiofrequency ablation of the lumbar facet.    PRN Procedures:  None at this point.    Referral(s) or Consult(s): None at this time.  Medications administered during this visit: Ms. Mccleland had no medications administered during this visit.  Future Appointments Date Time Provider Barry  02/09/2016 9:45 AM Milinda Pointer, MD [facility] None    Primary Care Physician: Rusty Aus, MD Location: [facility] Note by: Kathlen Brunswick. Dossie Arbour, M.D, DABA, DABAPM, DABPM, DABIPP, FIPP  Pain Score Disclaimer: We use the NRS-11 scale. This is a self-reported, subjective measurement of pain severity with only modest accuracy. It is used primarily to identify changes within a particular patient. It must be understood that outpatient pain scales are significantly less accurate that those used for research, where they can be applied under ideal controlled circumstances with minimal exposure to variables. In reality, the score is likely to be a combination of pain intensity and pain affect, where pain affect describes the degree of emotional arousal or changes in action  readiness caused by the sensory experience of pain. Factors such as social and work situation, setting, emotional state, anxiety levels, expectation, and prior pain experience may influence pain perception and show large inter-individual differences that may also be affected by time variables.

## 2016-02-06 NOTE — Patient Instructions (Addendum)
Steps to Quit Smoking  Smoking tobacco can be harmful to your health and can affect almost every organ in your body. Smoking puts you, and those around you, at risk for developing many serious chronic diseases. Quitting smoking is difficult, but it is one of the best things that you can do for your health. It is never too late to quit. WHAT ARE THE BENEFITS OF QUITTING SMOKING? When you quit smoking, you lower your risk of developing serious diseases and conditions, such as: 1. Lung cancer or lung disease, such as COPD. 2. Heart disease. 3. Stroke. 4. Heart attack. 5. Infertility. 6. Osteoporosis and bone fractures. Additionally, symptoms such as coughing, wheezing, and shortness of breath may get better when you quit. You may also find that you get sick less often because your body is stronger at fighting off colds and infections. If you are pregnant, quitting smoking can help to reduce your chances of having a baby of low birth weight. HOW DO I GET READY TO QUIT? When you decide to quit smoking, create a plan to make sure that you are successful. Before you quit:  Pick a date to quit. Set a date within the next two weeks to give you time to prepare.  Write down the reasons why you are quitting. Keep this list in places where you will see it often, such as on your bathroom mirror or in your car or wallet.  Identify the people, places, things, and activities that make you want to smoke (triggers) and avoid them. Make sure to take these actions:  Throw away all cigarettes at home, at work, and in your car.  Throw away smoking accessories, such as ashtrays and lighters.  Clean your car and make sure to empty the ashtray.  Clean your home, including curtains and carpets.  Tell your family, friends, and coworkers that you are quitting. Support from your loved ones can make quitting easier.  Talk with your health care provider about your options for quitting smoking.  Find out what  treatment options are covered by your health insurance. WHAT STRATEGIES CAN I USE TO QUIT SMOKING?  Talk with your healthcare provider about different strategies to quit smoking. Some strategies include: 1. Quitting smoking altogether instead of gradually lessening how much you smoke over a period of time. Research shows that quitting "cold turkey" is more successful than gradually quitting. 2. Attending in-person counseling to help you build problem-solving skills. You are more likely to have success in quitting if you attend several counseling sessions. Even short sessions of 10 minutes can be effective. 3. Finding resources and support systems that can help you to quit smoking and remain smoke-free after you quit. These resources are most helpful when you use them often. They can include: 1. Online chats with a counselor. 2. Telephone quitlines. 3. Printed self-help materials. 4. Support groups or group counseling. 5. Text messaging programs. 6. Mobile phone applications. 4. Taking medicines to help you quit smoking. (If you are pregnant or breastfeeding, talk with your health care provider first.) Some medicines contain nicotine and some do not. Both types of medicines help with cravings, but the medicines that include nicotine help to relieve withdrawal symptoms. Your health care provider may recommend: 1. Nicotine patches, gum, or lozenges. 2. Nicotine inhalers or sprays. 3. Non-nicotine medicine that is taken by mouth. Talk with your health care provider about combining strategies, such as taking medicines while you are also receiving in-person counseling. Using these two strategies together makes   you more likely to succeed in quitting than if you used either strategy on its own. If you are pregnant or breastfeeding, talk with your health care provider about finding counseling or other support strategies to quit smoking. Do not take medicine to help you quit smoking unless told to do so by  your health care provider. WHAT THINGS CAN I DO TO MAKE IT EASIER TO QUIT? Quitting smoking might feel overwhelming at first, but there is a lot that you can do to make it easier. Take these important actions:  Reach out to your family and friends and ask that they support and encourage you during this time. Call telephone quitlines, reach out to support groups, or work with a counselor for support.  Ask people who smoke to avoid smoking around you.  Avoid places that trigger you to smoke, such as bars, parties, or smoke-break areas at work.  Spend time around people who do not smoke.  Lessen stress in your life, because stress can be a smoking trigger for some people. To lessen stress, try:  Exercising regularly.  Deep-breathing exercises.  Yoga.  Meditating.  Performing a body scan. This involves closing your eyes, scanning your body from head to toe, and noticing which parts of your body are particularly tense. Purposefully relax the muscles in those areas.  Download or purchase mobile phone or tablet apps (applications) that can help you stick to your quit plan by providing reminders, tips, and encouragement. There are many free apps, such as QuitGuide from the CDC (Centers for Disease Control and Prevention). You can find other support for quitting smoking (smoking cessation) through smokefree.gov and other websites. HOW WILL I FEEL WHEN I QUIT SMOKING? Within the first 24 hours of quitting smoking, you may start to feel some withdrawal symptoms. These symptoms are usually most noticeable 2-3 days after quitting, but they usually do not last beyond 2-3 weeks. Changes or symptoms that you might experience include: 1. Mood swings. 2. Restlessness, anxiety, or irritation. 3. Difficulty concentrating. 4. Dizziness. 5. Strong cravings for sugary foods in addition to nicotine. 6. Mild weight gain. 7. Constipation. 8. Nausea. 9. Coughing or a sore throat. 10. Changes in how your  medicines work in your body. 11. A depressed mood. 12. Difficulty sleeping (insomnia). After the first 2-3 weeks of quitting, you may start to notice more positive results, such as: 1. Improved sense of smell and taste. 2. Decreased coughing and sore throat. 3. Slower heart rate. 4. Lower blood pressure. 5. Clearer skin. 6. The ability to breathe more easily. 7. Fewer sick days. Quitting smoking is very challenging for most people. Do not get discouraged if you are not successful the first time. Some people need to make many attempts to quit before they achieve long-term success. Do your best to stick to your quit plan, and talk with your health care provider if you have any questions or concerns.   This information is not intended to replace advice given to you by your health care provider. Make sure you discuss any questions you have with your health care provider.   Document Released: 09/25/2001 Document Revised: 02/15/2015 Document Reviewed: 02/15/2015 Elsevier Interactive Patient Education 2016 [employer] You Can Quit Smoking If you are ready to quit smoking or are thinking about it, congratulations! You have chosen to help yourself be healthier and live longer! There are lots of different ways to quit smoking. Nicotine gum, nicotine patches, a nicotine inhaler, or nicotine nasal spray can help with   physical craving. Hypnosis, support groups, and medicines help break the habit of smoking. TIPS TO GET OFF AND STAY OFF CIGARETTES 7. Learn to predict your moods. Do not let a bad situation be your excuse to have a cigarette. Some situations in your life might tempt you to have a cigarette. 8. Ask friends and co-workers not to smoke around you. 9. Make your home smoke-free. 10. Never have "just one" cigarette. It leads to wanting another and another. Remind yourself of your decision to quit. 11. On a card, make a list of your reasons for not smoking. Read it at least the same number of  times a day as you have a cigarette. Tell yourself everyday, "I do not want to smoke. I choose not to smoke." 12. Ask someone at home or work to help you with your plan to quit smoking. 13. Have something planned after you eat or have a cup of coffee. Take a walk or get other exercise to perk you up. This will help to keep you from overeating. 14. Try a relaxation exercise to calm you down and decrease your stress. Remember, you may be tense and nervous the first two weeks after you quit. This will pass. 15. Find new activities to keep your hands busy. Play with a pen, coin, or rubber band. Doodle or draw things on paper. 16. Brush your teeth right after eating. This will help cut down the craving for the taste of tobacco after meals. You can try mouthwash too. 17. Try gum, breath mints, or diet candy to keep something in your mouth. IF YOU SMOKE AND WANT TO QUIT:  Do not stock up on cigarettes. Never buy a carton. Wait until one pack is finished before you buy another.  Never carry cigarettes with you at work or at home.  Keep cigarettes as far away from you as possible. Leave them with someone else.  Never carry matches or a lighter with you.  Ask yourself, "Do I need this cigarette or is this just a reflex?"  Bet with someone that you can quit. Put cigarette money in a piggy bank every morning. If you smoke, you give up the money. If you do not smoke, by the end of the week, you keep the money.  Keep trying. It takes 21 days to change a habit!  Talk to your doctor about using medicines to help you quit. These include nicotine replacement gum, lozenges, or skin patches.   This information is not intended to replace advice given to you by your health care provider. Make sure you discuss any questions you have with your health care provider.   Document Released: 07/28/2009 Document Revised: 12/24/2011 Document Reviewed: 07/28/2009 Elsevier Interactive Patient Education 2016 Elsevier  Inc[employer] GENERAL RISKS AND COMPLICATIONS  What are the risk, side effects and possible complications? Generally speaking, most procedures are safe.  However, with any procedure there are risks, side effects, and the possibility of complications.  The risks and complications are dependent upon the sites that are lesioned, or the type of nerve block to be performed.  The closer the procedure is to the spine, the more serious the risks are.  Great care is taken when placing the radio frequency needles, block needles or lesioning probes, but sometimes complications can occur. 1. Infection: Any time there is an injection through the skin, there is a risk of infection.  This is why sterile conditions are used for these blocks.  There are four possible types of infection.   1. Localized skin infection. 2. Central Nervous System Infection-This can be in the form of Meningitis, which can be deadly. 3. Epidural Infections-This can be in the form of an epidural abscess, which can cause pressure inside of the spine, causing compression of the spinal cord with subsequent paralysis. This would require an emergency surgery to decompress, and there are no guarantees that the patient would recover from the paralysis. 4. Discitis-This is an infection of the intervertebral discs.  It occurs in about 1% of discography procedures.  It is difficult to treat and it may lead to surgery.        2. Pain: the needles have to go through skin and soft tissues, will cause soreness.       3. Damage to internal structures:  The nerves to be lesioned may be near blood vessels or    other nerves which can be potentially damaged.       4. Bleeding: Bleeding is more common if the patient is taking blood thinners such as  aspirin, Coumadin, Ticiid, Plavix, etc., or if he/she have some genetic predisposition  such as hemophilia. Bleeding into the spinal canal can cause compression of the spinal  cord with subsequent paralysis.  This would  require an emergency surgery to  decompress and there are no guarantees that the patient would recover from the  paralysis.       5. Pneumothorax:  Puncturing of a lung is a possibility, every time a needle is introduced in  the area of the chest or upper back.  Pneumothorax refers to free air around the  collapsed lung(s), inside of the thoracic cavity (chest cavity).  Another two possible  complications related to a similar event would include: Hemothorax and Chylothorax.   These are variations of the Pneumothorax, where instead of air around the collapsed  lung(s), you may have blood or chyle, respectively.       6. Spinal headaches: They may occur with any procedures in the area of the spine.       7. Persistent CSF (Cerebro-Spinal Fluid) leakage: This is a rare problem, but may occur  with prolonged intrathecal or epidural catheters either due to the formation of a fistulous  track or a dural tear.       8. Nerve damage: By working so close to the spinal cord, there is always a possibility of  nerve damage, which could be as serious as a permanent spinal cord injury with  paralysis.       9. Death:  Although rare, severe deadly allergic reactions known as "Anaphylactic  reaction" can occur to any of the medications used.      10. Worsening of the symptoms:  We can always make thing worse.  What are the chances of something like this happening? Chances of any of this occuring are extremely low.  By statistics, you have more of a chance of getting killed in a motor vehicle accident: while driving to the hospital than any of the above occurring .  Nevertheless, you should be aware that they are possibilities.  In general, it is similar to taking a shower.  Everybody knows that you can slip, hit your head and get killed.  Does that mean that you should not shower again?  Nevertheless always keep in mind that statistics do not mean anything if you happen to be on the wrong side of them.  Even if a procedure  has a 1 (one) in a 1,000,000 (million) chance   of going wrong, it you happen to be that one..Also, keep in mind that by statistics, you have more of a chance of having something go wrong when taking medications.  Who should not have this procedure? If you are on a blood thinning medication (e.g. Coumadin, Plavix, see list of "Blood Thinners"), or if you have an active infection going on, you should not have the procedure.  If you are taking any blood thinners, please inform your physician.  How should I prepare for this procedure?  Do not eat or drink anything at least six hours prior to the procedure.  Bring a driver with you .  It cannot be a taxi.  Come accompanied by an adult that can drive you back, and that is strong enough to help you if your legs get weak or numb from the local anesthetic.  Take all of your medicines the morning of the procedure with just enough water to swallow them.  If you have diabetes, make sure that you are scheduled to have your procedure done first thing in the morning, whenever possible.  If you have diabetes, take only half of your insulin dose and notify our nurse that you have done so as soon as you arrive at the clinic.  If you are diabetic, but only take blood sugar pills (oral hypoglycemic), then do not take them on the morning of your procedure.  You may take them after you have had the procedure.  Do not take aspirin or any aspirin-containing medications, at least eleven (11) days prior to the procedure.  They may prolong bleeding.  Wear loose fitting clothing that may be easy to take off and that you would not mind if it got stained with Betadine or blood.  Do not wear any jewelry or perfume  Remove any nail coloring.  It will interfere with some of our monitoring equipment.  NOTE: Remember that this is not meant to be interpreted as a complete list of all possible complications.  Unforeseen problems may occur.  BLOOD THINNERS The following  drugs contain aspirin or other products, which can cause increased bleeding during surgery and should not be taken for 2 weeks prior to and 1 week after surgery.  If you should need take something for relief of minor pain, you may take acetaminophen which is found in Tylenol,m Datril, Anacin-3 and Panadol. It is not blood thinner. The products listed below are.  Do not take any of the products listed below in addition to any listed on your instruction sheet.  A.P.C or A.P.C with Codeine Codeine Phosphate Capsules #3 Ibuprofen Ridaura  ABC compound Congesprin Imuran rimadil  Advil Cope Indocin Robaxisal  Alka-Seltzer Effervescent Pain Reliever and Antacid Coricidin or Coricidin-D  Indomethacin Rufen  Alka-Seltzer plus Cold Medicine Cosprin Ketoprofen S-A-C Tablets  Anacin Analgesic Tablets or Capsules Coumadin Korlgesic Salflex  Anacin Extra Strength Analgesic tablets or capsules CP-2 Tablets Lanoril Salicylate  Anaprox Cuprimine Capsules Levenox Salocol  Anexsia-D Dalteparin Magan Salsalate  Anodynos Darvon compound Magnesium Salicylate Sine-off  Ansaid Dasin Capsules Magsal Sodium Salicylate  Anturane Depen Capsules Marnal Soma  APF Arthritis pain formula Dewitt's Pills Measurin Stanback  Argesic Dia-Gesic Meclofenamic Sulfinpyrazone  Arthritis Bayer Timed Release Aspirin Diclofenac Meclomen Sulindac  Arthritis pain formula Anacin Dicumarol Medipren Supac  Analgesic (Safety coated) Arthralgen Diffunasal Mefanamic Suprofen  Arthritis Strength Bufferin Dihydrocodeine Mepro Compound Suprol  Arthropan liquid Dopirydamole Methcarbomol with Aspirin Synalgos  ASA tablets/Enseals Disalcid Micrainin Tagament  Ascriptin Doan's Midol Talwin  Ascriptin   A/D Dolene Mobidin Tanderil  Ascriptin Extra Strength Dolobid Moblgesic Ticlid  Ascriptin with Codeine Doloprin or Doloprin with Codeine Momentum Tolectin  Asperbuf Duoprin Mono-gesic Trendar  Aspergum Duradyne Motrin or Motrin IB Triminicin  Aspirin  plain, buffered or enteric coated Durasal Myochrisine Trigesic  Aspirin Suppositories Easprin Nalfon Trillsate  Aspirin with Codeine Ecotrin Regular or Extra Strength Naprosyn Uracel  Atromid-S Efficin Naproxen Ursinus  Auranofin Capsules Elmiron Neocylate Vanquish  Axotal Emagrin Norgesic Verin  Azathioprine Empirin or Empirin with Codeine Normiflo Vitamin E  Azolid Emprazil Nuprin Voltaren  Bayer Aspirin plain, buffered or children's or timed BC Tablets or powders Encaprin Orgaran Warfarin Sodium  Buff-a-Comp Enoxaparin Orudis Zorpin  Buff-a-Comp with Codeine Equegesic Os-Cal-Gesic   Buffaprin Excedrin plain, buffered or Extra Strength Oxalid   Bufferin Arthritis Strength Feldene Oxphenbutazone   Bufferin plain or Extra Strength Feldene Capsules Oxycodone with Aspirin   Bufferin with Codeine Fenoprofen Fenoprofen Pabalate or Pabalate-SF   Buffets II Flogesic Panagesic   Buffinol plain or Extra Strength Florinal or Florinal with Codeine Panwarfarin   Buf-Tabs Flurbiprofen Penicillamine   Butalbital Compound Four-way cold tablets Penicillin   Butazolidin Fragmin Pepto-Bismol   Carbenicillin Geminisyn Percodan   Carna Arthritis Reliever Geopen Persantine   Carprofen Gold's salt Persistin   Chloramphenicol Goody's Phenylbutazone   Chloromycetin Haltrain Piroxlcam   Clmetidine heparin Plaquenil   Cllnoril Hyco-pap Ponstel   Clofibrate Hydroxy chloroquine Propoxyphen         Before stopping any of these medications, be sure to consult the physician who ordered them.  Some, such as Coumadin (Warfarin) are ordered to prevent or treat serious conditions such as "deep thrombosis", "pumonary embolisms", and other heart problems.  The amount of time that you may need off of the medication may also vary with the medication and the reason for which you were taking it.  If you are taking any of these medications, please make sure you notify your pain physician before you undergo any  procedures.         Facet Blocks Patient Information  Description: The facets are joints in the spine between the vertebrae.  Like any joints in the body, facets can become irritated and painful.  Arthritis can also effect the facets.  By injecting steroids and local anesthetic in and around these joints, we can temporarily block the nerve supply to them.  Steroids act directly on irritated nerves and tissues to reduce selling and inflammation which often leads to decreased pain.  Facet blocks may be done anywhere along the spine from the neck to the low back depending upon the location of your pain.   After numbing the skin with local anesthetic (like Novocaine), a small needle is passed onto the facet joints under x-ray guidance.  You may experience a sensation of pressure while this is being done.  The entire block usually lasts about 15-25 minutes.   Conditions which may be treated by facet blocks:   Low back/buttock pain  Neck/shoulder pain  Certain types of headaches  Preparation for the injection:  5. Do not eat any solid food or dairy products within 8 hours of your appointment. 6. You may drink clear liquid up to 3 hours before appointment.  Clear liquids include water, black coffee, juice or soda.  No milk or cream please. 7. You may take your regular medication, including pain medications, with a sip of water before your appointment.  Diabetics should hold regular insulin (if taken separately) and take 1/2 normal NPH dose   the morning of the procedure.  Carry some sugar containing items with you to your appointment. 8. A driver must accompany you and be prepared to drive you home after your procedure. 9. Bring all your current medications with you. 10. An IV may be inserted and sedation may be given at the discretion of the physician. 11. A blood pressure cuff, EKG and other monitors will often be applied during the procedure.  Some patients may need to have extra oxygen  administered for a short period. 12. You will be asked to provide medical information, including your allergies and medications, prior to the procedure.  We must know immediately if you are taking blood thinners (like Coumadin/Warfarin) or if you are allergic to IV iodine contrast (dye).  We must know if you could possible be pregnant.  Possible side-effects:   Bleeding from needle site  Infection (rare, may require surgery)  Nerve injury (rare)  Numbness & tingling (temporary)  Difficulty urinating (rare, temporary)  Spinal headache (a headache worse with upright posture)  Light-headedness (temporary)  Pain at injection site (serveral days)  Decreased blood pressure (rare, temporary)  Weakness in arm/leg (temporary)  Pressure sensation in back/neck (temporary)   Call if you experience:   Fever/chills associated with headache or increased back/neck pain  Headache worsened by an upright position  New onset, weakness or numbness of an extremity below the injection site  Hives or difficulty breathing (go to the emergency room)  Inflammation or drainage at the injection site(s)  Severe back/neck pain greater than usual  New symptoms which are concerning to you  Please note:  Although the local anesthetic injected can often make your back or neck feel good for several hours after the injection, the pain will likely return. It takes 3-7 days for steroids to work.  You may not notice any pain relief for at least one week.  If effective, we will often do a series of 2-3 injections spaced 3-6 weeks apart to maximally decrease your pain.  After the initial series, you may be a candidate for a more permanent nerve block of the facets.  If you have any questions, please call [phone] [facility] 

## 2016-02-06 NOTE — Progress Notes (Signed)
Safety precautions to be maintained throughout the outpatient stay will include: orient to surroundings, keep bed in low position, maintain call bell within reach at all times, provide assistance with transfer out of bed and ambulation. Patient did not bring meds into office today.

## 2016-02-09 ENCOUNTER — Ambulatory Visit (HOSPITAL_BASED_OUTPATIENT_CLINIC_OR_DEPARTMENT_OTHER): Payer: Medicare Other | Admitting: Pain Medicine

## 2016-02-09 ENCOUNTER — Encounter: Payer: Self-pay | Admitting: Pain Medicine

## 2016-02-09 VITALS — BP 124/62 | HR 80 | Temp 98.9°F | Resp 16 | Ht 63.0 in | Wt 161.0 lb

## 2016-02-09 DIAGNOSIS — M542 Cervicalgia: Secondary | ICD-10-CM

## 2016-02-09 DIAGNOSIS — Z9889 Other specified postprocedural states: Secondary | ICD-10-CM | POA: Insufficient documentation

## 2016-02-09 DIAGNOSIS — M5481 Occipital neuralgia: Secondary | ICD-10-CM | POA: Insufficient documentation

## 2016-02-09 DIAGNOSIS — M4802 Spinal stenosis, cervical region: Secondary | ICD-10-CM

## 2016-02-09 DIAGNOSIS — M25511 Pain in right shoulder: Secondary | ICD-10-CM

## 2016-02-09 DIAGNOSIS — R1013 Epigastric pain: Secondary | ICD-10-CM

## 2016-02-09 DIAGNOSIS — M47816 Spondylosis without myelopathy or radiculopathy, lumbar region: Secondary | ICD-10-CM | POA: Diagnosis not present

## 2016-02-09 DIAGNOSIS — R51 Headache: Secondary | ICD-10-CM

## 2016-02-09 DIAGNOSIS — M4806 Spinal stenosis, lumbar region: Secondary | ICD-10-CM | POA: Insufficient documentation

## 2016-02-09 DIAGNOSIS — M47896 Other spondylosis, lumbar region: Secondary | ICD-10-CM | POA: Insufficient documentation

## 2016-02-09 DIAGNOSIS — L708 Other acne: Secondary | ICD-10-CM | POA: Insufficient documentation

## 2016-02-09 DIAGNOSIS — M25512 Pain in left shoulder: Secondary | ICD-10-CM

## 2016-02-09 DIAGNOSIS — M50222 Other cervical disc displacement at C5-C6 level: Secondary | ICD-10-CM | POA: Insufficient documentation

## 2016-02-09 DIAGNOSIS — I10 Essential (primary) hypertension: Secondary | ICD-10-CM | POA: Insufficient documentation

## 2016-02-09 DIAGNOSIS — H409 Unspecified glaucoma: Secondary | ICD-10-CM | POA: Insufficient documentation

## 2016-02-09 DIAGNOSIS — R45851 Suicidal ideations: Secondary | ICD-10-CM

## 2016-02-09 DIAGNOSIS — M4316 Spondylolisthesis, lumbar region: Secondary | ICD-10-CM

## 2016-02-09 DIAGNOSIS — M5417 Radiculopathy, lumbosacral region: Secondary | ICD-10-CM

## 2016-02-09 DIAGNOSIS — M25551 Pain in right hip: Secondary | ICD-10-CM

## 2016-02-09 DIAGNOSIS — E109 Type 1 diabetes mellitus without complications: Secondary | ICD-10-CM

## 2016-02-09 DIAGNOSIS — M545 Low back pain, unspecified: Secondary | ICD-10-CM

## 2016-02-09 DIAGNOSIS — M25562 Pain in left knee: Secondary | ICD-10-CM

## 2016-02-09 DIAGNOSIS — M47812 Spondylosis without myelopathy or radiculopathy, cervical region: Secondary | ICD-10-CM | POA: Insufficient documentation

## 2016-02-09 DIAGNOSIS — G8929 Other chronic pain: Secondary | ICD-10-CM | POA: Diagnosis not present

## 2016-02-09 DIAGNOSIS — D369 Benign neoplasm, unspecified site: Secondary | ICD-10-CM | POA: Insufficient documentation

## 2016-02-09 DIAGNOSIS — G43909 Migraine, unspecified, not intractable, without status migrainosus: Secondary | ICD-10-CM

## 2016-02-09 DIAGNOSIS — M25552 Pain in left hip: Secondary | ICD-10-CM

## 2016-02-09 DIAGNOSIS — F17200 Nicotine dependence, unspecified, uncomplicated: Secondary | ICD-10-CM

## 2016-02-09 DIAGNOSIS — M170 Bilateral primary osteoarthritis of knee: Secondary | ICD-10-CM | POA: Insufficient documentation

## 2016-02-09 DIAGNOSIS — M25561 Pain in right knee: Secondary | ICD-10-CM | POA: Insufficient documentation

## 2016-02-09 DIAGNOSIS — E559 Vitamin D deficiency, unspecified: Secondary | ICD-10-CM

## 2016-02-09 DIAGNOSIS — M5416 Radiculopathy, lumbar region: Secondary | ICD-10-CM

## 2016-02-09 DIAGNOSIS — Z79891 Long term (current) use of opiate analgesic: Secondary | ICD-10-CM

## 2016-02-09 MED ORDER — FENTANYL BOLUS VIA INFUSION
25.0000 ug | INTRAVENOUS | Status: DC | PRN
  Filled 2016-02-09: qty 50

## 2016-02-09 MED ORDER — LACTATED RINGERS IV SOLN: 1000.0000 mL | Freq: Once | INTRAVENOUS | Status: DC

## 2016-02-09 MED ORDER — FENTANYL CITRATE (PF) 100 MCG/2ML IJ SOLN
INTRAMUSCULAR | Status: AC
  Administered 2016-02-09: 100 ug via INTRAVENOUS
  Filled 2016-02-09: qty 2

## 2016-02-09 MED ORDER — ROPIVACAINE HCL 2 MG/ML IJ SOLN
INTRAMUSCULAR | Status: AC
  Administered 2016-02-09: 11:00:00
  Filled 2016-02-09: qty 20

## 2016-02-09 MED ORDER — KETOROLAC TROMETHAMINE 60 MG/2ML IM SOLN
INTRAMUSCULAR | Status: AC
  Administered 2016-02-09: 60 mg via INTRAMUSCULAR
  Filled 2016-02-09: qty 2

## 2016-02-09 MED ORDER — ROPIVACAINE HCL 2 MG/ML IJ SOLN: 9.0000 mL | Freq: Once | INTRAMUSCULAR | Status: DC

## 2016-02-09 MED ORDER — TRIAMCINOLONE ACETONIDE 40 MG/ML IJ SUSP: 40.0000 mg | Freq: Once | INTRAMUSCULAR | Status: DC

## 2016-02-09 MED ORDER — TRIAMCINOLONE ACETONIDE 40 MG/ML IJ SUSP
INTRAMUSCULAR | Status: AC
Start: 2016-02-09 — End: 2016-02-09
  Administered 2016-02-09: 11:00:00
  Filled 2016-02-09: qty 2

## 2016-02-09 MED ORDER — LIDOCAINE HCL (PF) 1 % IJ SOLN: 10.0000 mL | Freq: Once | INTRAMUSCULAR | Status: DC

## 2016-02-09 MED ORDER — MIDAZOLAM BOLUS VIA INFUSION
1.0000 mg | INTRAVENOUS | Status: DC | PRN
  Filled 2016-02-09: qty 2

## 2016-02-09 MED ORDER — MIDAZOLAM HCL 5 MG/5ML IJ SOLN
INTRAMUSCULAR | Status: AC
  Administered 2016-02-09: 5 mg via INTRAVENOUS
  Filled 2016-02-09: qty 5

## 2016-02-09 MED ORDER — ORPHENADRINE CITRATE 30 MG/ML IJ SOLN
INTRAMUSCULAR | Status: AC
  Administered 2016-02-09: 60 mg via INTRAMUSCULAR
  Filled 2016-02-09: qty 2

## 2016-02-09 NOTE — Patient Instructions (Signed)
Facet Joint Block The facet joints connect the bones of the spine (vertebrae). They make it possible for you to bend, twist, and make other movements with your spine. They also prevent you from overbending, overtwisting, and making other excessive movements.  A facet joint block is a procedure where a numbing medicine (anesthetic) is injected into a facet joint. Often, a type of anti-inflammatory medicine called a steroid is also injected. A facet joint block may be done for two reasons:   Diagnosis. A facet joint block may be done as a test to see whether neck or back pain is caused by a worn-down or infected facet joint. If the pain gets better after a facet joint block, it means the pain is probably coming from the facet joint. If the pain does not get better, it means the pain is probably not coming from the facet joint.   Therapy. A facet joint block may be done to relieve neck or back pain caused by a facet joint. A facet joint block is only done as a therapy if the pain does not improve with medicine, exercise programs, physical therapy, and other forms of pain management. LET YOUR HEALTH CARE PROVIDER KNOW ABOUT:   Any allergies you have.   All medicines you are taking, including vitamins, herbs, eyedrops, and over-the-counter medicines and creams.   Previous problems you or members of your family have had with the use of anesthetics.   Any blood disorders you have had.   Other health problems you have. RISKS AND COMPLICATIONS Generally, having a facet joint block is safe. However, as with any procedure, complications can occur. Possible complications associated with having a facet joint block include:   Bleeding.   Injury to a nerve near the injection site.   Pain at the injection site.   Weakness or numbness in areas controlled by nerves near the injection site.   Infection.   Temporary fluid retention.   Allergic reaction to anesthetics or medicines used during  the procedure. BEFORE THE PROCEDURE   Follow your health care provider's instructions if you are taking dietary supplements or medicines. You may need to stop taking them or reduce your dosage.   Do not take any new dietary supplements or medicines without asking your health care provider first.   Follow your health care provider's instructions about eating and drinking before the procedure. You may need to stop eating and drinking several hours before the procedure.   Arrange to have an adult drive you home after the procedure. PROCEDURE  You may need to remove your clothing and dress in an open-back gown so that your health care provider can access your spine.   The procedure will be done while you are lying on an X-ray table. Most of the time you will be asked to lie on your stomach, but you may be asked to lie in a different position if an injection will be made in your neck.   Special machines will be used to monitor your oxygen levels, heart rate, and blood pressure.   If an injection will be made in your neck, an intravenous (IV) tube will be inserted into one of your veins. Fluids and medicine will flow directly into your body through the IV tube.   The area over the facet joint where the injection will be made will be cleaned with an antiseptic soap. The surrounding skin will be covered with sterile drapes.   An anesthetic will be applied to your skin   to make the injection area numb. You may feel a temporary stinging or burning sensation.   A video X-ray machine will be used to locate the joint. A contrast dye may be injected into the facet joint area to help with locating the joint.   When the joint is located, an anesthetic medicine will be injected into the joint through the needle.   Your health care provider will ask you whether you feel pain relief. If you do feel relief, a steroid may be injected to provide pain relief for a longer period of time. If you do not  feel relief or feel only partial relief, additional injections of an anesthetic may be made in other facet joints.   The needle will be removed, the skin will be cleansed, and bandages will be applied.  AFTER THE PROCEDURE   You will be observed for 15-30 minutes before being allowed to go home. Do not drive. Have an adult drive you or take a taxi or public transportation instead.   If you feel pain relief, the pain will return in several hours or days when the anesthetic wears off.   You may feel pain relief 2-14 days after the procedure. The amount of time this relief lasts varies from person to person.   It is normal to feel some tenderness over the injected area(s) for 2 days following the procedure.   If you have diabetes, you may have a temporary increase in blood sugar.   This information is not intended to replace advice given to you by your health care provider. Make sure you discuss any questions you have with your health care provider.   Document Released: 02/20/2007 Document Revised: 10/22/2014 Document Reviewed: 07/21/2012 [employer] Interactive Patient Education 2016 [employer] Pain Management Discharge Instructions  General Discharge Instructions :  If you need to reach your doctor call: Monday-Friday 8:00 am - 4:00 pm at [phone] or toll free [phone].  After clinic hours [phone] to have operator reach doctor.  Bring all of your medication bottles to all your appointments in the pain clinic.  To cancel or reschedule your appointment with Pain Management please remember to call 24 hours in advance to avoid a fee.  Refer to the educational materials which you have been given on: General Risks, I had my Procedure. Discharge Instructions, Post Sedation.  Post Procedure Instructions:  The drugs you were given will stay in your system until tomorrow, so for the next 24 hours you should not drive, make any legal decisions or drink any alcoholic  beverages.  You may eat anything you prefer, but it is better to start with liquids then soups and crackers, and gradually work up to solid foods.  Please notify your doctor immediately if you have any unusual bleeding, trouble breathing or pain that is not related to your normal pain.  Depending on the type of procedure that was done, some parts of your body may feel week and/or numb.  This usually clears up by tonight or the next day.  Walk with the use of an assistive device or accompanied by an adult for the 24 hours.  You may use ice on the affected area for the first 24 hours.  Put ice in a Ziploc bag and cover with a towel and place against area 15 minutes on 15 minutes off.  You may switch to heat after 24 hours. 

## 2016-02-09 NOTE — Progress Notes (Signed)
Safety precautions to be maintained throughout the outpatient stay will include: orient to surroundings, keep bed in low position, maintain call bell within reach at all times, provide assistance with transfer out of bed and ambulation.  

## 2016-02-09 NOTE — Progress Notes (Signed)
Patient's Name: Brittany Cummings  Patient type: Established  MRN: [id number]  Service setting: Ambulatory outpatient  DOB: 1966-06-29  Location: [facility]  DOS: 02/09/2016  Primary Care Physician: Brittany Aus, MD  Note by: Brittany Cummings, M.D, DABA, DABAPM, DABPM, DABIPP, FIPP  Referring Physician: Rusty Aus, MD  Specialty: Board-Certified Interventional Pain Management     Primary Reason(s) for Visit: Interventional Pain Management Treatment. CC: Back Pain; Neck Pain; and Shoulder Pain  Primary Diagnosis: Facet syndrome, lumbar [M54.5]   Procedure:  Anesthesia, Analgesia, Anxiolysis:  Type: Diagnostic Medial Branch Facet Block Region: Lumbar Level: L2, L3, L4, L5, & S1 Medial Branch Level(s) Laterality: Bilateral  Indications: 1. Lumbar facet syndrome (Location of Primary Source of Pain) (Bilateral) (R>L)   2. Lumbar spondylosis, unspecified spinal osteoarthritis   3. Chronic low back pain (Location of Primary Source of Pain) (Bilateral) (R>L)   4. Chronic lumbar radicular pain (Left)     Pre-procedure Pain Score: 5/10 Reported level of pain is compatible with clinical observations Post-procedure Pain Score: 5   Type: Moderate (Conscious) Sedation & Local Anesthesia Local Anesthetic: Lidocaine 1% Route: Intravenous (IV) IV Access: Secured Sedation: Meaningful verbal contact was maintained at all times during the procedure  Indication(s): Analgesia & Anxiolysis   Pre-Procedure Assessment:  [name] is a 50 y.o. year old, female patient, seen today for interventional treatment. 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); 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); Lumbar facet hypertrophy (Severe at L4-5); Chronic neck pain (Bilateral) (L>R); Cervicogenic headache (Left); Occipital neuralgia (Left); Chronic shoulder pain (Bilateral) (R>L); Chronic cervical radicular pain (Bilateral) (L>R); Chronic low back pain (Location of Primary Source of Pain) (Bilateral) (R>L); Lumbar facet syndrome (Location of Primary Source of Pain) (Bilateral) (R>L); Chronic knee pain (Location of Secondary source of pain) (Bilateral) (R>L); Chronic lumbar radicular pain (Left); Lumbosacral radiculopathy at L4 (Left); Chronic lower extremity pain (Left); Posture arthritis of knee (Location of Secondary source of pain) (Bilateral) (R>L); Chronic foot pain (Bilateral) (L>R); Chronic hip pain (Bilateral) (R>L); Recurrent major depressive disorder, in partial remission ([place]); Suicidal ideations; Clinical depression; Lumbar foraminal stenosis (Severe) (L4-5) (Left);  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); and Pain management on her problem list.. Her primarily concern today is the Back Pain; Neck Pain; and Shoulder Pain  On the patient's initial visit to our clinic was on 12/13/2015 due to a primary complaint of low back pain. Prior to that, the patient had been seen by me at [facility] due to chronic abdominal pain. At that time the patient received a bilateral celiac plexus block and it was not long after that that she underwent an L4-5 fusion for instability of her L45 anterolisthesis. While at [facility], we had not taken over her medications and because of  that we did not have a medical psychology evaluation for substance use disorder. It was on 12/13/2015 that this was ordered  as part of her pharmacological evaluation.  The patient came back to see [place] on 01/11/2016 at which time she communicated to [place] that she was actively having suicidal thoughts. Because of this, I made the decision not to start any opioids that she could use as a means to kill herself and I went ahead and sent her to the emergency Department to be evaluated. As soon as I informed the patient that I was not can give her any pain medication, she started crying and saying that were not helping her. This is not quite accurate since I simply made a decision not to use opioids as part of her treatment. However, I did offer her some interventional options. It wasn't long after that and that she ended up at Dr. Myrtis Ser office where she received a prescription for hydrocodone/APAP (Norco) 7.5/325 to be taken every 8 hours.   Because the patient indicated having cervical radicular pain, and she had radiological evidence of a herniated C5-6 and C6-7 with severe central cervical spinal stenosis at those 2 levels, I offered her a cervical epidural steroid injection. We went ahead and schedule this for her the very next day on 01/12/2016.  On 01/12/2016 she came in and had a diagnostic left sided interlaminar epidural steroid injection at the C7-T1 level. She chose to have this done without sedation. On 02/06/2016 she return to the clinics for her postprocedure evaluation indicating that she had attained a 100% relief of the pain that lasted for approximatel, after which the pain started coming back. At the time, she was still enjoying 50% improvement from how she was on 01/12/2016.  In view of these results and the fact that on 02/06/2016 her low back pain was worse than the neck pain, we decided to try a diagnostic bilateral lumbar facet block. Physical examination had shown the patient to have  exact reproduction of her pain on hyperextension and rotation maneuver. In addition, a CT myelogram of the lumbar spine done on 07/21/2015 showed that the patient has advanced facet arthropathy and ligamentum flavum hypertrophy at the L4-5 level with a left foraminal synovial cyst. It also shows left L5-S1 disc space narrowing and postsurgical changes on the left with cautious narrowing of the left foraminal zone which could be affecting the left L5 nerve root. An MRI of the same area done on 05/05/2015 showed stable postoperative L5-S1 changes with progressed chronic severe facet arthropathy at L4-5 with new left sided synovial cyst resulting in moderate to severe left L4 neural foraminal stenosis.  Because her low back pain is worse than the lower extremity pain, today she comes in for a diagnostic bilateral lumbar facet block under fluoroscopic guidance and IV sedation. Because of the synovial cysts, we will stay away from doing intra-articular facet blocks and instead we will block the medial branch.  On 02/06/2016 the patient had a nerve conduction test done at the [facility] neurology Department by Dr. Manuella Ghazi. The test showed a chronic S1 radiculopathy on the right side as well as an old left L5 radiculopathy and a superimposed generalized sensorimotor polyneuropathy.  The patient had indicated previously having had some injections into her back by Dr. Marlaine Hind. She indicated that they caused a great deal of pain and she never went back to see him. Instead she went to Dr. Hal Neer. When I asked her about the injections, she was unable to tell me exactly what was said that he had done for her but she did volunteer that  the procedure have been done without any sedation. Because of this, we decided to do today's procedure with some sedation to see a pain chance that had anything to do with it. I went ahead and called Dr. Marlaine Hind at his office in [place] [phone]. He indicated that  around August 2016 he had done a bilateral diagnostic lumbar facet medial branch block of L4-5 and L5-S1. According to his notes, she attained 50% relief of the pain and there were no complications. He did mention that she never went back for a follow-up evaluation and instead she went to Dr. Hal Neer where she complained of the injection not having helped and having caused a lot of pain.  Pain Descriptors / Indicators: Constant, Sharp Pain Frequency: Constant  Date of Last Visit: 01/12/16 Service Provided on Last Visit: Procedure (left C7-T1 CESI)  Verification of the correct person, correct site (including marking of site), and correct procedure were performed and confirmed by the patient.  Consent: Secured. Under the influence of no sedatives a written informed consent was obtained, after having provided information on the risks and possible complications. To fulfill our ethical and legal obligations, as recommended by the American Medical Association's Code of Ethics, we have provided information to the patient about our clinical impression; the nature and purpose of the treatment or procedure; the risks, benefits, and possible complications of the intervention; alternatives; the risk(s) and benefit(s) of the alternative treatment(s) or procedure(s); and the risk(s) and benefit(s) of doing nothing. The patient was provided information about the risks and possible complications associated with the procedure. These include, but are not limited to, failure to achieve desired goals, infection, bleeding, organ or nerve damage, allergic reactions, paralysis, and death. In the case of spinal procedures these may include, but are not limited to, failure to achieve desired goals, infection, bleeding, organ or nerve damage, allergic reactions, paralysis, and death. In addition, the patient was informed that Medicine is not an exact science; therefore, there is also the possibility of unforeseen risks and  possible complications that may result in a catastrophic outcome. The patient indicated having understood very clearly. We have given the patient no guarantees and we have made no promises. Enough time was given to the patient to ask questions, all of which were answered to the patient's satisfaction.  Consent Attestation: I, the ordering provider, attest that I have discussed with the patient the benefits, risks, side-effects, alternatives, likelihood of achieving goals, and potential problems during recovery for the procedure that I have provided informed consent.  Pre-Procedure Preparation: Safety Precautions: Allergies reviewed. Appropriate site, procedure, and patient were confirmed by following the Joint Commission's Universal Protocol (UP.01.01.01), in the form of a "Time Out". The patient was asked to confirm marked site and procedure, before commencing. The patient was asked about blood thinners, or active infections, both of which were denied. Patient was assessed for positional comfort and all pressure points were checked before starting procedure. Allergies: She is allergic to meperidine.. Infection Control Precautions: Sterile technique used. Standard Universal Precautions were taken as recommended by the Department of [facility] for Disease Control and Prevention (CDC). Standard pre-surgical skin prep was conducted. Respiratory hygiene and cough etiquette was practiced. Hand hygiene observed. Safe injection practices and needle disposal techniques followed. SDV (single dose vial) medications used. Medications properly checked for expiration dates and contaminants. Personal protective equipment (PPE) used: Sterile Radiation-resistant gloves. Monitoring:  As per clinic protocol. Filed Vitals:   02/09/16 1137 02/09/16 1144 02/09/16 1154 02/09/16  1202  BP: 124/87 121/73 119/60 124/62  Pulse: 76 80 82 80  Temp:      TempSrc:      Resp: '11 18 16 16  '$ Height:      Weight:        SpO2: 93% 97% 99% 99%  Calculated BMI: Body mass index is 28.53 kg/(m^2).  Description of Procedure Process:   Time-out: "Time-out" completed before starting procedure, as per protocol. Position: Prone Target Area: For Lumbar Facet blocks, the target is the groove formed by the junction of the transverse process and superior articular process. For the L5 dorsal ramus, the target is the notch between superior articular process and sacral ala. For the S1 dorsal ramus, the target is the superior and lateral edge of the posterior S1 Sacral foramen. Approach: Paramedial approach. Area Prepped: Entire Posterior Lumbosacral Region Prepping solution: ChloraPrep (2% chlorhexidine gluconate and 70% isopropyl alcohol) Safety Precautions: Aspiration looking for blood return was conducted prior to all injections. At no point did we inject any substances, as a needle was being advanced. No attempts were made at seeking any paresthesias. Safe injection practices and needle disposal techniques used. Medications properly checked for expiration dates. SDV (single dose vial) medications used.   Description of the Procedure: Protocol guidelines were followed. The patient was placed in position over the fluoroscopy table. The target area was identified and the area prepped in the usual manner. Skin desensitized using vapocoolant spray. Skin & deeper tissues infiltrated with local anesthetic. Appropriate amount of time allowed to pass for local anesthetics to take effect. The procedure needle was introduced through the skin, ipsilateral to the reported pain, and advanced to the target area. Employing the "Medial Branch Technique", the needles were advanced to the angle made by the superior and medial portion of the transverse process, and the lateral and inferior portion of the superior articulating process of the targeted vertebral bodies. This area is known as "Burton's Eye" or the "Eye of the [place] Dog". A procedure  needle was introduced through the skin, and this time advanced to the angle made by the superior and medial border of the sacral ala, and the lateral border of the S1 vertebral body. This last needle was later repositioned at the superior and lateral border of the posterior S1 foramen. Negative aspiration confirmed. Solution injected in intermittent fashion, asking for systemic symptoms every 0.5cc of injectate. The needles were then removed and the area cleansed, making sure to leave some of the prepping solution back to take advantage of its long term bactericidal properties. EBL: None Materials & Medications Used:  Needle(s) Used: 22g - 3.5" Spinal Needle(s)  Imaging Guidance:   Type of Imaging Technique: Fluoroscopy Guidance (Spinal) Indication(s): Assistance in needle guidance and placement for procedures requiring needle placement in or near specific anatomical locations not easily accessible without such assistance. Exposure Time: Please see nurses notes. Contrast: None required. Fluoroscopic Guidance: I was personally present in the fluoroscopy suite, where the patient was placed in position for the procedure, over the fluoroscopy-compatible table. Fluoroscopy was manipulated, using "Tunnel Vision Technique", to obtain the best possible view of the target area, on the affected side. Parallax error was corrected before commencing the procedure. A "direction-depth-direction" technique was used to introduce the needle under continuous pulsed fluoroscopic guidance. Once the target was reached, antero-posterior, oblique, and lateral fluoroscopic projection views were taken to confirm needle placement in all planes. Permanently recorded images stored by scanning into EMR. Interpretation: Intraoperative imaging interpretation by performing  Physician. Adequate needle placement confirmed. Adequate needle placement confirmed in AP, lateral, & Oblique Views. No contrast injected.  Antibiotic Prophylaxis:    Indication(s): No indications identified. Type:  Antibiotics Given (last 72 hours)    None       Post-operative Assessment:   Complications: No immediate post-treatment complications were observed. The procedure was accomplished flawlessly, without any paresthesias having being triggered or any other events that could suggest any postoperative problems. However, as the patient was about to leave from our recovery area, she asked the nurse if I was going to give her any pain medication. There are 3 primary reasons why I was not planning on given her any pain medication today:   1.) The first reason is that I had already communicated to the patient that due to her psychiatric issues and suicidal ideations, I did not feel comfortable prescribing any type of opioids and therefore I have taken that option off of the table. I clearly stated to her that my involvement in her pain management would be limited to interventional therapies only.  2.) The second reason is that because this is a diagnostic injection, it is not advisable to start a new medication, on the day of the procedure, as this may confuse the results of the diagnostic test.  3.) The third and final reason is that prior to the patient coming in today I had checked the [facility] PMP database and found that on 01/24/2016 the patient had received hydrocodone/APAP 7.5/325 #90 pills. This was prescribed to her by Dr. Emily Filbert on 01/24/16. The prescription was written to last her for 30 days. The prescription was filled for her on the same date, at the [facility] on [address], [place], [place]. Based on this, the patient should've had enough medication to last until 02/23/2016.   Although the patient initially seemed to be doing well and ready to go home, once she was informed that she would not be getting any pain medication, she became extremely upset saying that I was not helping her and that she had been coming to see me for the past  3 months and that I had done nothing for her. The recovery room nurse came in and informed me that there was an issue with the patient and that both, the patient and the husband wanted to talk to me. I asked for them to be placed in a patient's room and when I went in to see her she was sobbing saying that I had hurt her and that I was doing nothing for her pain. Her husband was very much interested in trying to figure out what was going on and he asked me to be honest with him. I informed him that that is the way that I talked to my patients and that I wasn't planning on holding anything back. The patient was complaining of severe spasms of her back which she later told the nurse were on the right side. I examined both sides of her lower back and as expected, the paravertebral muscles which were completely relaxed and there was no evidence of any muscle spasms, swelling, or asymmetrical temperature changes. There wasn't any evidence of active bleeding or any other signs suggesting an acute emergency. I reviewed her vital signs and at the time of the admission she had a pain score of 5/10, with a blood pressure 121/73, pulse rate of 80, a respiratory rate of 18, and an oxygen saturation of 97%. At the time of  her discharge from the recovery room her vitals were: Blood pressure 124/62, pulse rate 80, respiratory rate 16, oxygen saturation of 99%, and a pain score of 5/10. From this, it was clear to me that her reported level of distress was not compatible with my clinical findings. Furthermore, I had just blocked the patient's L2, L3, L4, L5, and S1 medial branches, bilaterally, using ropivacaine 0.2% (18 mL). Because this nerves innervate the lumbar paravertebral muscles, it is very unlikely that she could be having the spasms that she was describing. I did explain this to her husband and I basically stated that this seemed to be a nonphysiological event. At this point I called back into the procedure room for a  patient that had been waiting for me for the past 18 minutes, on the procedure table. I told Mr. and Mrs. Giraldo that, if they wanted, they could wait for me and that I would be more than glad to continue our conversation. Given her the benefit of doubt, I went ahead and ordered an IM injection of Toradol 60 mg + Norflex 60 mg. When I came back from the procedure room they had already left and I was informed that she did not want to talk to me any further and that what she wanted to do was go home.  Disposition: Return to clinic for follow-up evaluation. The patient tolerated the entire procedure well. A repeat set of vitals were taken after the procedure and the patient was kept under observation following institutional policy, for this procedure. Post-procedural neurological assessment was performed, showing return to baseline, prior to discharge. The patient was discharged home, once institutional criteria were met. The patient was provided with post-procedure discharge instructions, including a section on how to identify potential problems. Should any problems arise concerning this procedure, the patient was given instructions to immediately contact us, at any time, without hesitation. In any case, we plan to contact the patient by telephone for a follow-up status report regarding this interventional procedure.  Comments: Clearly, Mr. and Mrs. Desir are not happy with the care that I have been providing them. I am aware that the patient is crying and complaints of pain or probably affecting Mr. Samet, causing considerable stress. Mr. Bryk directly asked me if I thought that she was making this up and that he could not believe that she was capable of doing that. I informed him that I was having a lot of difficulties understanding what was going on with her since there was a disconnection between her reported symptoms and the observed clinical signs. I was very clear in explaining to him that in view of  the fact that I had just administered 100 g of fentanyl IV +5 mg of midazolam IV, in addition to temporarily chemically denervating with almost 20 mL(s) of the local anesthetic, most of her lumbar medial branches, there was probably not much I can do to try to eliminate this pain. Furthermore, if she was responding like this to the administration of an opioid, a benzodiazepine, and local anesthetics, and complaining of more pain than before the treatment, and it didn't really feel comfortable in attempting anything else. From the conversation we had today, I came out with the impression that they are expecting me to provide her with 100% relief of her pain within a very short period time. Jill Alexanders looking at the patient's PMP, there is evidence that the patient has been getting some type of pain medication on a monthly basis for  over 7 years. Furthermore, I do not think that they understood that the butorphanol is a morphinan-type synthetic agonist-antagonist opioid analgesic. This means that every time she uses this nasal spray, she is actually antagonizing the effects of her other pain medicine. I had already told her to stop using it, but unfortunately, this is one of those medications that patients have a lot of difficulty stopping.  Orders Placed This Encounter  Procedures  . LUMBAR FACET(MEDIAL BRANCH NERVE BLOCK) MBNB    Scheduling Instructions:     Side: Bilateral     Level: L2, L3, L4, L5, & S1 Medial Branch Nerve     Sedation: With Sedation.     Timeframe: Today    Order Specific Question:  Where will this procedure be performed?    Answer:  [facility]    Reviewed & updated problems:  Problem  Chronic low back pain (Location of Primary Source of Pain) (Bilateral) (R>L)   Previously treated by Dr. Gerlene Fee & Dr. Murray Hodgkins   Chronic lumbar radicular pain (Left)   On 02/06/2016 the patient had a nerve conduction test done at the [facility] neurology Department by Dr. Sherryll Burger. The  test showed a chronic S1 radiculopathy on the right side as well as an old left L5 radiculopathy and a superimposed generalized sensorimotor polyneuropathy.    Medications administered during this visit: We administered ropivacaine (PF) 2 mg/ml (0.2%), triamcinolone acetonide, midazolam, fentaNYL, orphenadrine, and ketorolac.  Prescriptions ordered during this visit: New Prescriptions   No medications on file    Future Appointments Date Time Provider Department Center  03/01/2016 1:40 PM Delano Metz, MD [facility] None    Primary Care Physician: Danella Penton, MD Location: [facility] Note by: Sydnee Levans. Laban Emperor, M.D, DABA, DABAPM, DABPM, DABIPP, FIPP   Illustration of the posterior view of the lumbar spine and the posterior neural structures. Laminae of L2 through S1 are labeled. DPRL5, dorsal primary ramus of L5; DPRS1, dorsal primary ramus of S1; DPR3, dorsal primary ramus of L3; FJ, facet (zygapophyseal) joint L3-L4; I, inferior articular process of L4; LB1, lateral branch of dorsal primary ramus of L1; IAB, inferior articular branches from L3 medial branch (supplies L4-L5 facet joint); IBP, intermediate branch plexus; MB3, medial branch of dorsal primary ramus of L3; NR3, third lumbar nerve root; S, superior articular process of L5; SAB, superior articular branches from L4 (supplies L4-5 facet joint also); TP3, transverse process of L3.  Disclaimer:  Medicine is not an Visual merchandiser. The only guarantee in medicine is that nothing is guaranteed. It is important to note that the decision to proceed with this intervention was based on the information collected from the patient. The Data and conclusions were drawn from the patient's questionnaire, the interview, and the physical examination. Because the information was provided in large part by the patient, it cannot be guaranteed that it has not been purposely or unconsciously manipulated. Every effort has  been made to obtain as much relevant data as possible for this evaluation. It is important to note that the conclusions that lead to this procedure are derived in large part from the available data. Always take into account that the treatment will also be dependent on availability of resources and existing treatment guidelines, considered by other Pain Management Practitioners as being common knowledge and practice, at the time of the intervention. For Medico-Legal purposes, it is also important to point out that variation in procedural techniques and pharmacological choices are the acceptable  norm. The indications, contraindications, technique, and results of the above procedure should only be interpreted and judged by a Board-Certified Interventional Pain Specialist with extensive familiarity and expertise in the same exact procedure and technique. Attempts at providing opinions without similar or greater experience and expertise than that of the treating physician will be considered as inappropriate and unethical, and shall result in a formal complaint to the state medical board and applicable specialty societies.

## 2016-02-09 NOTE — Progress Notes (Signed)
To patient room on request of Dr. Dossie Arbour. Patient crying. Husband at side. Patient stated I cant live like this. I hurt so bad and he wont help me. My back is hurting worse now. Pointed to right side. I assessed and rubbed up and down area she stated was hurting. Did not feel any obvious spasm. Tissue soft and warm to touch. No swollen areas noted. Asked what she would like for me to do for her. She stated he wont help me. I want to go home. I informed her I was not holding her against her will she was free to go. I was concerned for her safety with her saying she would kill herself. She stated I'm not going to do that I'm just going to go home and go to bed. Stated she stays in the bed most of her time. Husband agreed with this statement. Asked her if she would wait until I at least got to speak to Dr. Dossie Arbour. Her husband was encouraging of this so she agreed. After speaking with DR. Dossie Arbour I informed her he had offered  Toradol and Norflex if she was not allergic and explained what both were. She agreed to the injections and stated she was not allergic. Both injections were administered. She was calming down and asked could she go home. I asked her husband about taking her home and he was fine with that. I again reiterated why Dr. Dossie Arbour would not give her medications at this point. She stated she was not going to come back but didn't know what she was going to do. I gave her instructions to put ice on her back when she got home rest and see how she felt prior to making any decisions. Discharged via wheelchair in the care of her husband.

## 2016-02-09 NOTE — Progress Notes (Signed)
Upon discharge from recovery, patient states  "Dr Consuela Mimes will not give me pain medications. He is not helping me." Explained to pt that Dr Consuela Mimes would not give medication on the day of procedure and that he does procedures first to see if they will help with the pain. Told her if she had narcotics that it could mask the pain and Dr  wouldn't know if the procedure was right for her. "I have been coming her for 3 months and he won't give me any", reexplained the protocol to her. She then states that she would be better off dead then to keep living in pain. "All I want to do is get out of the bed and be able to live my life without pain" Pt states that she has been having these suidical thoughts often. At this point I informed pt that I needed to talk with the MD about what she told me. "I am not going to do it, It's been a few days since I last thought this". "My husband and I both are very mad and upset, He wont do anything to help " she stated that her husband came to the clinic yesterday wanting to talk with Dr Consuela Mimes . After informing MD of patients assessment,  we came to room to find that she was crying and bent out stating the Dr Consuela Mimes made her right side worse. Dr Consuela Mimes spoke with pt approximately 20 minutes listening to her and her husband complaints and concerns. Ice packs were again offered to pt for comfort and to lie in bed. Both were refused.   Care was then turned over to Cherly Anderson, Therapist, sports.

## 2016-02-10 ENCOUNTER — Emergency Department: Payer: Medicare Other

## 2016-02-10 ENCOUNTER — Other Ambulatory Visit: Payer: Self-pay

## 2016-02-10 ENCOUNTER — Telehealth: Payer: Self-pay

## 2016-02-10 ENCOUNTER — Inpatient Hospital Stay
Admission: EM | Admit: 2016-02-10 | Discharge: 2016-02-13 | DRG: 917 | Disposition: A | Discharge status: Other Acute Inpatient Hospital | Payer: Medicare Other | Attending: Internal Medicine | Admitting: Internal Medicine

## 2016-02-10 DIAGNOSIS — G92 Toxic encephalopathy: Secondary | ICD-10-CM | POA: Diagnosis present

## 2016-02-10 DIAGNOSIS — E1140 Type 2 diabetes mellitus with diabetic neuropathy, unspecified: Secondary | ICD-10-CM | POA: Diagnosis present

## 2016-02-10 DIAGNOSIS — Z79899 Other long term (current) drug therapy: Secondary | ICD-10-CM

## 2016-02-10 DIAGNOSIS — R45851 Suicidal ideations: Secondary | ICD-10-CM

## 2016-02-10 DIAGNOSIS — J9602 Acute respiratory failure with hypercapnia: Secondary | ICD-10-CM | POA: Diagnosis present

## 2016-02-10 DIAGNOSIS — F17210 Nicotine dependence, cigarettes, uncomplicated: Secondary | ICD-10-CM | POA: Diagnosis present

## 2016-02-10 DIAGNOSIS — J302 Other seasonal allergic rhinitis: Secondary | ICD-10-CM | POA: Diagnosis present

## 2016-02-10 DIAGNOSIS — Z800 Family history of malignant neoplasm of digestive organs: Secondary | ICD-10-CM | POA: Diagnosis not present

## 2016-02-10 DIAGNOSIS — Z79891 Long term (current) use of opiate analgesic: Secondary | ICD-10-CM

## 2016-02-10 DIAGNOSIS — M47812 Spondylosis without myelopathy or radiculopathy, cervical region: Secondary | ICD-10-CM | POA: Diagnosis present

## 2016-02-10 DIAGNOSIS — Y92009 Unspecified place in unspecified non-institutional (private) residence as the place of occurrence of the external cause: Secondary | ICD-10-CM | POA: Diagnosis not present

## 2016-02-10 DIAGNOSIS — R4182 Altered mental status, unspecified: Secondary | ICD-10-CM

## 2016-02-10 DIAGNOSIS — Z818 Family history of other mental and behavioral disorders: Secondary | ICD-10-CM

## 2016-02-10 DIAGNOSIS — G894 Chronic pain syndrome: Secondary | ICD-10-CM | POA: Diagnosis present

## 2016-02-10 DIAGNOSIS — J9601 Acute respiratory failure with hypoxia: Secondary | ICD-10-CM | POA: Diagnosis present

## 2016-02-10 DIAGNOSIS — R451 Restlessness and agitation: Secondary | ICD-10-CM | POA: Diagnosis not present

## 2016-02-10 DIAGNOSIS — Z7951 Long term (current) use of inhaled steroids: Secondary | ICD-10-CM

## 2016-02-10 DIAGNOSIS — K219 Gastro-esophageal reflux disease without esophagitis: Secondary | ICD-10-CM | POA: Diagnosis present

## 2016-02-10 DIAGNOSIS — Z9884 Bariatric surgery status: Secondary | ICD-10-CM | POA: Diagnosis not present

## 2016-02-10 DIAGNOSIS — F332 Major depressive disorder, recurrent severe without psychotic features: Secondary | ICD-10-CM

## 2016-02-10 DIAGNOSIS — T43592A Poisoning by other antipsychotics and neuroleptics, intentional self-harm, initial encounter: Secondary | ICD-10-CM | POA: Diagnosis present

## 2016-02-10 DIAGNOSIS — Z981 Arthrodesis status: Secondary | ICD-10-CM | POA: Diagnosis not present

## 2016-02-10 DIAGNOSIS — Z915 Personal history of self-harm: Secondary | ICD-10-CM

## 2016-02-10 DIAGNOSIS — I10 Essential (primary) hypertension: Secondary | ICD-10-CM | POA: Diagnosis present

## 2016-02-10 DIAGNOSIS — Z794 Long term (current) use of insulin: Secondary | ICD-10-CM

## 2016-02-10 DIAGNOSIS — Z8711 Personal history of peptic ulcer disease: Secondary | ICD-10-CM | POA: Diagnosis not present

## 2016-02-10 DIAGNOSIS — T447X2A Poisoning by beta-adrenoreceptor antagonists, intentional self-harm, initial encounter: Secondary | ICD-10-CM | POA: Diagnosis present

## 2016-02-10 DIAGNOSIS — T424X2A Poisoning by benzodiazepines, intentional self-harm, initial encounter: Secondary | ICD-10-CM | POA: Diagnosis present

## 2016-02-10 DIAGNOSIS — G8929 Other chronic pain: Secondary | ICD-10-CM | POA: Diagnosis present

## 2016-02-10 DIAGNOSIS — R410 Disorientation, unspecified: Secondary | ICD-10-CM | POA: Diagnosis not present

## 2016-02-10 DIAGNOSIS — T50902D Poisoning by unspecified drugs, medicaments and biological substances, intentional self-harm, subsequent encounter: Secondary | ICD-10-CM | POA: Diagnosis not present

## 2016-02-10 LAB — BLOOD GAS, ARTERIAL
Acid-base deficit: 2.5 mmol/L — ABNORMAL HIGH (ref 0.0–2.0)
BICARBONATE: 21.7 meq/L (ref 21.0–28.0)
FIO2: 0.5
MECHANICAL RATE: 16
MECHVT: 500 mL
O2 Saturation: 99.8 %
PEEP: 5 cmH2O
Patient temperature: 37
pCO2 arterial: 35 mmHg (ref 32.0–48.0)
pH, Arterial: 7.4 (ref 7.350–7.450)
pO2, Arterial: 226 mmHg — ABNORMAL HIGH (ref 83.0–108.0)

## 2016-02-10 LAB — COMPREHENSIVE METABOLIC PANEL
ALT: 16 U/L (ref 14–54)
AST: 20 U/L (ref 15–41)
Albumin: 3.4 g/dL — ABNORMAL LOW (ref 3.5–5.0)
Alkaline Phosphatase: 68 U/L (ref 38–126)
Anion gap: 9 (ref 5–15)
BILIRUBIN TOTAL: 0.3 mg/dL (ref 0.3–1.2)
BUN: 13 mg/dL (ref 6–20)
CHLORIDE: 107 mmol/L (ref 101–111)
CO2: 21 mmol/L — ABNORMAL LOW (ref 22–32)
CREATININE: 0.63 mg/dL (ref 0.44–1.00)
Calcium: 8.2 mg/dL — ABNORMAL LOW (ref 8.9–10.3)
Glucose, Bld: 363 mg/dL — ABNORMAL HIGH (ref 65–99)
Potassium: 3.7 mmol/L (ref 3.5–5.1)
Sodium: 137 mmol/L (ref 135–145)
TOTAL PROTEIN: 6.5 g/dL (ref 6.5–8.1)

## 2016-02-10 LAB — SEDIMENTATION RATE: SED RATE: 8 mm/h (ref 0–30)

## 2016-02-10 LAB — URINE DRUG SCREEN, QUALITATIVE (ARMC ONLY)
Amphetamines, Ur Screen: NOT DETECTED
Barbiturates, Ur Screen: NOT DETECTED
Benzodiazepine, Ur Scrn: POSITIVE — AB
CANNABINOID 50 NG, UR ~~LOC~~: NOT DETECTED
COCAINE METABOLITE, UR ~~LOC~~: NOT DETECTED
MDMA (ECSTASY) UR SCREEN: NOT DETECTED
Methadone Scn, Ur: NOT DETECTED
OPIATE, UR SCREEN: NOT DETECTED
PHENCYCLIDINE (PCP) UR S: NOT DETECTED
Tricyclic, Ur Screen: POSITIVE — AB

## 2016-02-10 LAB — CBC WITH DIFFERENTIAL/PLATELET
BASOS ABS: 0 10*3/uL (ref 0–0.1)
BASOS PCT: 0 %
EOS PCT: 0 %
Eosinophils Absolute: 0 10*3/uL (ref 0–0.7)
HEMATOCRIT: 39.5 % (ref 35.0–47.0)
HEMOGLOBIN: 13.3 g/dL (ref 12.0–16.0)
LYMPHS ABS: 0.8 10*3/uL — AB (ref 1.0–3.6)
LYMPHS PCT: 7 %
MCH: 27.7 pg (ref 26.0–34.0)
MCHC: 33.8 g/dL (ref 32.0–36.0)
MCV: 82 fL (ref 80.0–100.0)
MONOS PCT: 5 %
Monocytes Absolute: 0.6 10*3/uL (ref 0.2–0.9)
NEUTROS ABS: 10.2 10*3/uL — AB (ref 1.4–6.5)
Neutrophils Relative %: 88 %
Platelets: 179 10*3/uL (ref 150–440)
RBC: 4.82 MIL/uL (ref 3.80–5.20)
RDW: 13.2 % (ref 11.5–14.5)
WBC: 11.6 10*3/uL — ABNORMAL HIGH (ref 3.6–11.0)

## 2016-02-10 LAB — URINALYSIS COMPLETE WITH MICROSCOPIC (ARMC ONLY)
BACTERIA UA: NONE SEEN
Bilirubin Urine: NEGATIVE
Glucose, UA: >500 mg/dL — AB
Hgb urine dipstick: NEGATIVE
KETONES UR: NEGATIVE mg/dL
Leukocytes, UA: NEGATIVE
Nitrite: NEGATIVE
PROTEIN: NEGATIVE mg/dL
SPECIFIC GRAVITY, URINE: 1.01 (ref 1.005–1.030)
Squamous Epithelial / LPF: NONE SEEN
pH: 6 (ref 5.0–8.0)

## 2016-02-10 LAB — SALICYLATE LEVEL: Salicylate Lvl: <4 mg/dL (ref 2.8–30.0)

## 2016-02-10 LAB — ETHANOL

## 2016-02-10 LAB — PREGNANCY, URINE: Preg Test, Ur: NEGATIVE

## 2016-02-10 LAB — TROPONIN I: Troponin I: <0.03 ng/mL (ref <0.031)

## 2016-02-10 LAB — LACTIC ACID, PLASMA: Lactic Acid, Venous: 2.2 mmol/L (ref 0.5–2.0)

## 2016-02-10 LAB — ACETAMINOPHEN LEVEL

## 2016-02-10 LAB — BRAIN NATRIURETIC PEPTIDE: B NATRIURETIC PEPTIDE 5: 77 pg/mL (ref 0.0–100.0)

## 2016-02-10 MED ORDER — VECURONIUM BROMIDE 10 MG IV SOLR
10.0000 mg | Freq: Once | INTRAVENOUS | Status: AC
  Administered 2016-02-10: 10 mg via INTRAVENOUS

## 2016-02-10 MED ORDER — MIDAZOLAM HCL 5 MG/5ML IJ SOLN
5.0000 mg | Freq: Once | INTRAMUSCULAR | Status: AC
  Administered 2016-02-10: 5 mg via INTRAVENOUS

## 2016-02-10 MED ORDER — HALOPERIDOL LACTATE 5 MG/ML IJ SOLN
INTRAMUSCULAR | Status: AC
  Administered 2016-02-10: 2 mg via INTRAVENOUS
  Filled 2016-02-10: qty 1

## 2016-02-10 MED ORDER — PROPOFOL 1000 MG/100ML IV EMUL
5.0000 ug/kg/min | Freq: Once | INTRAVENOUS | Status: AC
  Administered 2016-02-10: 5 ug/kg/min via INTRAVENOUS
  Filled 2016-02-10: qty 100

## 2016-02-10 MED ORDER — HALOPERIDOL LACTATE 5 MG/ML IJ SOLN
2.0000 mg | Freq: Once | INTRAMUSCULAR | Status: AC
  Administered 2016-02-10: 2 mg via INTRAVENOUS

## 2016-02-10 MED ORDER — HALOPERIDOL LACTATE 5 MG/ML IJ SOLN
5.0000 mg | Freq: Once | INTRAMUSCULAR | Status: AC
  Administered 2016-02-10: 5 mg via INTRAVENOUS

## 2016-02-10 MED ORDER — HALOPERIDOL LACTATE 5 MG/ML IJ SOLN
3.0000 mg | Freq: Once | INTRAMUSCULAR | Status: AC
  Administered 2016-02-10: 3 mg via INTRAVENOUS

## 2016-02-10 MED ORDER — LORAZEPAM 2 MG/ML IJ SOLN
2.0000 mg | Freq: Once | INTRAMUSCULAR | Status: AC
  Administered 2016-02-10: 2 mg via INTRAVENOUS

## 2016-02-10 MED ORDER — LORAZEPAM 2 MG/ML IJ SOLN
1.0000 mg | Freq: Once | INTRAMUSCULAR | Status: AC
  Administered 2016-02-10: 1 mg via INTRAVENOUS

## 2016-02-10 MED ORDER — LORAZEPAM 2 MG/ML IJ SOLN
INTRAMUSCULAR | Status: AC
  Administered 2016-02-10: 1 mg via INTRAVENOUS
  Filled 2016-02-10: qty 1

## 2016-02-10 NOTE — ED Notes (Signed)
Pt arrived by EMS combative with altered mental status.  Per EMS, husband called 911 because pt came out of room altered.  EMS gave 2 doses of narcan.  Pt fighting staff and biting.

## 2016-02-10 NOTE — ED Provider Notes (Addendum)
[facility] Emergency Department Provider Note  ____________________________________________  Time seen: Approximately 11:35 PM  I have reviewed the triage vital signs and the nursing notes.   HISTORY  Chief Complaint Altered Mental Status  History difficult to obtain due to altered mental status in fact a history was able to obtain was from the EMS crew and the husband.  HPI Brittany Cummings is a 50 y.o. female who husband reports having had a lumbar spinal injection 2 days ago. This was done for low back pain. The pain had gotten worse afterwards and patient had been laying in bed for 2 days. Husband got home from work today patient was laying in bed complaining of pain but was otherwise normal. Possibly 2 hours or so later he was sitting in the living room she came staggering out mumbling and not making any sense and collapsed into his arms. He called EMS. EMS arrived patient was confused combative biting and scratching fighting patient arrived in the emergency room in the same fashion patient was given Haldol 5 and Ativan to which really did not inhibit her at all she was given another 5 of Haldol and 1 of Ativan which diminished her combativeness combativeness somewhat but not enough to allow a good evaluation. Patient was therefore given Versed and vecuronium and intubated easily with a #7 ET tube under direct vision was good color change and good bilateral breath sounds with no breath sounds in the stomach. Patient was then able to be CT and further evaluated patient was found not to be febrile CT did not show any etiology for the patient's combativeness. I discussed the patient with the intensivist at [facility] who then had the nurse practitioner admit the patient here.  Past Medical History  Diagnosis Date  . Spondylolisthesis   . Diabetes mellitus without complication ([place])   . Hypertension   . Spinal headache     migraines  . Depression   . Seasonal  allergies   . Diabetic neuropathy ([place])   . GERD (gastroesophageal reflux disease)   . Multiple gastric ulcers   . Arthritis   . Rib fracture     right side  . Cervical disc disease 02/08/2014  . Spondylolisthesis of lumbar region 09/15/2015    Patient Active Problem List   Diagnosis Date Noted  . Suicidal ideations 01/11/2016  . Clinical depression 01/11/2016  . Lumbar foraminal stenosis (Severe) (L4-5) (Left) 01/11/2016  .  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 Cervical herniated disc (C5-6 and C6-7) 01/11/2016  . Thoracic disc herniation (T1-2) (Left) 01/11/2016  . Moderate episode of recurrent major depressive disorder ([place]) 01/11/2016  . Pain management 01/11/2016  . Recurrent major depressive disorder, in partial remission ([place]) 12/21/2015  . Adenomatous polyp 12/13/2015  . Glaucoma 12/13/2015  . Headache, migraine 12/13/2015  . Chronic pain 12/13/2015  . Chronic pain syndrome 12/13/2015  . Long term current use of opiate analgesic 12/13/2015  . Long term prescription opiate use 12/13/2015  . Opiate use (100  MME/Day) 12/13/2015  . Encounter for therapeutic drug level monitoring 12/13/2015  . Encounter for pain management planning 12/13/2015  . Chronic abdominal pain (epigastric) 12/13/2015  . Cervical spondylosis 12/13/2015  . Lumbar spondylosis 12/13/2015  . Avitaminosis D 12/13/2015  . 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) 12/13/2015  . Lumbar facet hypertrophy (Severe at  L4-5) 12/13/2015  . Chronic neck pain (Bilateral) (L>R) 12/13/2015  . Cervicogenic headache (Left) 12/13/2015  . Occipital neuralgia (Left) 12/13/2015  . Chronic shoulder pain (Bilateral) (R>L) 12/13/2015  . Chronic cervical radicular pain (Bilateral) (L>R) 12/13/2015  . Chronic low back pain (Location of Primary Source of Pain) (Bilateral)  (R>L) 12/13/2015  . Lumbar facet syndrome (Location of Primary Source of Pain) (Bilateral) (R>L) 12/13/2015  . Chronic knee pain (Location of Secondary source of pain) (Bilateral) (R>L) 12/13/2015  . Chronic lumbar radicular pain (Left) 12/13/2015  . Lumbosacral radiculopathy at L4 (Left) 12/13/2015  . Chronic lower extremity pain (Left) 12/13/2015  . Posture arthritis of knee (Location of Secondary source of pain) (Bilateral) (R>L) 12/13/2015  . Chronic foot pain (Bilateral) (L>R) 12/13/2015  . Chronic hip pain (Bilateral) (R>L) 12/13/2015  . Acne inversa 10/18/2014  . Type 1 diabetes mellitus ([place]) 02/08/2014  . BP (high blood pressure) 02/08/2014  . Temporary cerebral vascular dysfunction 02/08/2014  . Current tobacco use 02/08/2014    Past Surgical History  Procedure Laterality Date  . Gastric bypass    . Back surgery    . Breast reduction surgery    . Appendectomy    . Carpal tunnel release      right   . Joint replacement      left knee  . Knee arthroscopy      Left  . Cataract extraction      right  . Abdominal abscess excision    . Diagnostic laparoscopy      exploratory lap and LOA  . Cholecystectomy    . Hernia repair    . Colonoscopy w/ biopsies and polypectomy    . Wisdom tooth extraction    . Spinal fusion  09-15-2015    L4-5    Current Outpatient Rx  Name  Route  Sig  Dispense  Refill  . butorphanol (STADOL) 10 MG/ML nasal spray   Left Nare   Place 1 spray into left nostril every 4 (four) hours as needed for migraine.         . cyclobenzaprine (FLEXERIL) 10 MG tablet   Oral   Take 1 tablet (10 mg total) by mouth 3 (three) times daily as needed for muscle spasms.   30 tablet   0   . diclofenac sodium (VOLTAREN) 1 % GEL   Topical   Apply 2 g topically 4 (four) times daily as needed (for pain). Pt applies to her back.         . DULoxetine (CYMBALTA) 60 MG capsule   Oral   Take 60 mg by mouth 2 (two) times daily.      2   .  estradiol-norethindrone (MIMVEY) 1-0.5 MG tablet   Oral   Take 1 tablet by mouth daily.          . fluticasone (FLONASE) 50 MCG/ACT nasal spray   Each Nare   Place 2 sprays into both nostrils daily.         Marland Kitchen gabapentin (NEURONTIN) 300 MG capsule   Oral   Take 300 mg by mouth at bedtime as needed (for pain).          Marland Kitchen HYDROcodone-acetaminophen (NORCO) 7.5-325 MG tablet   Oral   Take 1 tablet by mouth 3 (three) times daily as needed for moderate pain.         . metoprolol succinate (TOPROL-XL) 100 MG 24 hr tablet   Oral   Take 100 mg by mouth daily.          Marland Kitchen  Multiple Vitamin (MULTIVITAMIN WITH MINERALS) TABS tablet   Oral   Take 1 tablet by mouth daily.         . Oxycodone HCl 10 MG TABS   Oral   Take 10 mg by mouth every 4 (four) hours as needed (for pain).         . pantoprazole (PROTONIX) 40 MG tablet   Oral   Take 40 mg by mouth daily.          Marland Kitchen PARoxetine (PAXIL) 20 MG tablet   Oral   Take 20 mg by mouth daily.          . QUEtiapine (SEROQUEL) 100 MG tablet   Oral   Take 100 mg by mouth at bedtime.          . sucralfate (CARAFATE) 1 G tablet   Oral   Take 1 g by mouth 4 (four) times daily -  with meals and at bedtime.         Marland Kitchen azelastine (ASTELIN) 0.1 % nasal spray   Each Nare   Place 1 spray into both nostrils 2 (two) times daily. Use in each nostril as directed         . Calcium Carb-Cholecalciferol (CALCIUM 600/VITAMIN D3) 600-800 MG-UNIT TABS   Oral   Take 1 tablet by mouth daily.            Allergies Meperidine  Family History  Problem Relation Age of Onset  . Cancer Mother   . Cancer - Colon Father   . Diabetes Other   . Stroke Other   . Hypertension Other     Social History Social History  Substance Use Topics  . Smoking status: Current Every Day Smoker -- 1.00 packs/day for 25 years    Types: Cigarettes  . Smokeless tobacco: Never Used  . Alcohol Use: No    Review of Systems Unable to obtain due to  altered mental status  ____________________________________________   PHYSICAL EXAM:  VITAL SIGNS: ED Triage Vitals  Enc Vitals Group     BP --      Pulse --      Resp --      Temp --      Temp src --      SpO2 02/10/16 2200 99 %     Weight --      Height --      Head Cir --      Peak Flow --      Pain Score --      Pain Loc --      Pain Edu? --      Excl. in [place]? --    Constitutional: Confused and combative Eyes: Conjunctivae are normal. PERRL. EOMI. Head: Atraumatic. Nose: No congestion/rhinnorhea. Mouth/Throat: Mucous membranes are moist.  Oropharynx non-erythematous. Neck: No stridor.  Neck is not stiff at all patient's moving at all over the place  Cardiovascular: Normal rate, regular rhythm. Grossly normal heart sounds.  Good peripheral circulation. Respiratory: Normal respiratory effort.  No retractions. Lungs CTAB. Gastrointestinal: Soft and nontender. No distention. No abdominal bruits. No CVA tenderness. Musculoskeletal: No lower extremity tenderness nor edema.  No joint effusions. Neurologic:  Speech was confused. Moving all extremities equally well and very strongly. Skin:  Skin is warm, dry and intact. No rash noted.   ____________________________________________   LABS (all labs ordered are listed, but only abnormal results are displayed)  Labs Reviewed  ACETAMINOPHEN LEVEL - Abnormal; Notable for the following:  Acetaminophen (Tylenol), Serum <10 (*)    All other components within normal limits  COMPREHENSIVE METABOLIC PANEL - Abnormal; Notable for the following:    CO2 21 (*)    Glucose, Bld 363 (*)    Calcium 8.2 (*)    Albumin 3.4 (*)    All other components within normal limits  LACTIC ACID, PLASMA - Abnormal; Notable for the following:    Lactic Acid, Venous 2.2 (*)    All other components within normal limits  CBC WITH DIFFERENTIAL/PLATELET - Abnormal; Notable for the following:    WBC 11.6 (*)    Neutro Abs 10.2 (*)    Lymphs Abs 0.8  (*)    All other components within normal limits  URINALYSIS COMPLETEWITH MICROSCOPIC ([facility] ONLY) - Abnormal; Notable for the following:    Color, Urine STRAW (*)    APPearance CLEAR (*)    Glucose, UA >500 (*)    All other components within normal limits  URINE DRUG SCREEN, QUALITATIVE ([facility] ONLY) - Abnormal; Notable for the following:    Tricyclic, Ur Screen POSITIVE (*)    Benzodiazepine, Ur Scrn POSITIVE (*)    All other components within normal limits  BLOOD GAS, ARTERIAL - Abnormal; Notable for the following:    pO2, Arterial 226 (*)    Acid-base deficit 2.5 (*)    All other components within normal limits  ETHANOL  BRAIN NATRIURETIC PEPTIDE  SALICYLATE LEVEL  TROPONIN I  PREGNANCY, URINE  SEDIMENTATION RATE  LACTIC ACID, PLASMA   ____________________________________________  EKG  EKG was done but is not currently available to me. I do not remember the EKG showing acute acute pathology ____________________________________________  RADIOLOGY  CT of the head was read by radiology as being a negative study Chest x-ray read by radiology showing ET tube in good position and the NG tube going past the end of the study. There was no explanation for the patient's mental status there _________________________________   PROCEDURES  Procedure(s) performed: Procedure performed was attempting IV sedation and rapid sequence induction intubation as described in history of present illness  Critical Care performed: Critical care time one hour. This includes discussing the patient with the hospital EMS crew attempting to sedate the patient intubating the patient discussing the patient with intensivist  ____________________________________________   INITIAL IMPRESSION / [place] / ED COURSE  Pertinent labs & imaging results that were available during my care of the patient were reviewed by me and considered in my medical decision making (see chart for  details).  ____________________________________________   FINAL CLINICAL IMPRESSION(S) / ED DIAGNOSES  Final diagnoses:  Altered mental status, unspecified altered mental status type      Nena Polio, MD 02/11/16 [id number]  Nena Polio, MD 02/27/16 [phone]

## 2016-02-10 NOTE — Telephone Encounter (Signed)
Post procedure phone call.  Patient states she is not feeling any better.  States she has used heat, topical agents and nothing is making it better.  Informed patient that she needed to give it 4 to 10 days to see if there is any benefit.  Instructed to call us for any questions or concerns.

## 2016-02-11 ENCOUNTER — Inpatient Hospital Stay: Payer: Medicare Other

## 2016-02-11 ENCOUNTER — Encounter: Payer: Self-pay | Admitting: *Deleted

## 2016-02-11 DIAGNOSIS — J9602 Acute respiratory failure with hypercapnia: Secondary | ICD-10-CM | POA: Diagnosis present

## 2016-02-11 DIAGNOSIS — R410 Disorientation, unspecified: Secondary | ICD-10-CM

## 2016-02-11 DIAGNOSIS — R451 Restlessness and agitation: Secondary | ICD-10-CM

## 2016-02-11 LAB — GLUCOSE, CAPILLARY
GLUCOSE-CAPILLARY: 249 mg/dL — AB (ref 65–99)
GLUCOSE-CAPILLARY: 215 mg/dL — AB (ref 65–99)
GLUCOSE-CAPILLARY: 225 mg/dL — AB (ref 65–99)
GLUCOSE-CAPILLARY: 172 mg/dL — AB (ref 65–99)
Glucose-Capillary: 148 mg/dL — ABNORMAL HIGH (ref 65–99)

## 2016-02-11 LAB — BASIC METABOLIC PANEL
Anion gap: 6 (ref 5–15)
BUN: 11 mg/dL (ref 6–20)
CHLORIDE: 112 mmol/L — AB (ref 101–111)
CO2: 20 mmol/L — ABNORMAL LOW (ref 22–32)
CREATININE: 0.59 mg/dL (ref 0.44–1.00)
Calcium: 7.5 mg/dL — ABNORMAL LOW (ref 8.9–10.3)
GFR calc Af Amer: >60 mL/min (ref >60)
GFR calc non Af Amer: >60 mL/min (ref >60)
GLUCOSE: 249 mg/dL — AB (ref 65–99)
POTASSIUM: 4 mmol/L (ref 3.5–5.1)
Sodium: 138 mmol/L (ref 135–145)

## 2016-02-11 LAB — CBC
HCT: 36.2 % (ref 35.0–47.0)
HEMOGLOBIN: 12 g/dL (ref 12.0–16.0)
MCH: 27.8 pg (ref 26.0–34.0)
MCHC: 33 g/dL (ref 32.0–36.0)
MCV: 84.2 fL (ref 80.0–100.0)
Platelets: 176 10*3/uL (ref 150–440)
RBC: 4.3 MIL/uL (ref 3.80–5.20)
RDW: 13.7 % (ref 11.5–14.5)
WBC: 11.1 10*3/uL — ABNORMAL HIGH (ref 3.6–11.0)

## 2016-02-11 LAB — MRSA PCR SCREENING: MRSA by PCR: NEGATIVE

## 2016-02-11 LAB — TRIGLYCERIDES: Triglycerides: 141 mg/dL (ref <150)

## 2016-02-11 LAB — PROCALCITONIN: Procalcitonin: <0.1 ng/mL

## 2016-02-11 LAB — MAGNESIUM: Magnesium: 1.8 mg/dL (ref 1.7–2.4)

## 2016-02-11 LAB — LACTIC ACID, PLASMA: Lactic Acid, Venous: 2.1 mmol/L (ref 0.5–2.0)

## 2016-02-11 LAB — PHOSPHORUS: Phosphorus: 2.6 mg/dL (ref 2.5–4.6)

## 2016-02-11 MED ORDER — ALBUTEROL SULFATE (2.5 MG/3ML) 0.083% IN NEBU: 2.5000 mg | INHALATION_SOLUTION | RESPIRATORY_TRACT | Status: DC | PRN

## 2016-02-11 MED ORDER — FENTANYL 2500MCG IN NS 250ML (10MCG/ML) PREMIX INFUSION
0.0000 ug/h | INTRAVENOUS | Status: DC
  Administered 2016-02-11: 25 ug/h via INTRAVENOUS
  Filled 2016-02-11: qty 250

## 2016-02-11 MED ORDER — PANTOPRAZOLE SODIUM 40 MG IV SOLR
40.0000 mg | Freq: Every day | INTRAVENOUS | Status: DC
Start: 2016-02-11 — End: 2016-02-11
  Administered 2016-02-11: 40 mg via INTRAVENOUS
  Filled 2016-02-11: qty 40

## 2016-02-11 MED ORDER — SENNOSIDES 8.8 MG/5ML PO SYRP: 5.0000 mL | ORAL_SOLUTION | Freq: Two times a day (BID) | ORAL | Status: DC | PRN

## 2016-02-11 MED ORDER — MIDAZOLAM HCL 2 MG/2ML IJ SOLN: 2.0000 mg | INTRAMUSCULAR | Status: DC | PRN

## 2016-02-11 MED ORDER — INSULIN ASPART 100 UNIT/ML ~~LOC~~ SOLN
0.0000 [IU] | Freq: Every day | SUBCUTANEOUS | Status: DC
  Administered 2016-02-12: 3 [IU] via SUBCUTANEOUS
  Filled 2016-02-11: qty 3

## 2016-02-11 MED ORDER — HEPARIN SODIUM (PORCINE) 5000 UNIT/ML IJ SOLN
5000.0000 [IU] | Freq: Three times a day (TID) | INTRAMUSCULAR | Status: DC
  Administered 2016-02-11 – 2016-02-12 (×4): 5000 [IU] via SUBCUTANEOUS
  Filled 2016-02-11 (×4): qty 1

## 2016-02-11 MED ORDER — SODIUM CHLORIDE 0.9 % IV SOLN
INTRAVENOUS | Status: DC
  Administered 2016-02-11: 06:00:00 via INTRAVENOUS

## 2016-02-11 MED ORDER — FENTANYL BOLUS VIA INFUSION
50.0000 ug | INTRAVENOUS | Status: DC | PRN
  Filled 2016-02-11: qty 50

## 2016-02-11 MED ORDER — SODIUM CHLORIDE 0.9 % IV SOLN: 250.0000 mL | INTRAVENOUS | Status: DC | PRN

## 2016-02-11 MED ORDER — MAGNESIUM SULFATE IN D5W 1-5 GM/100ML-% IV SOLN
1.0000 g | Freq: Once | INTRAVENOUS | Status: AC
  Administered 2016-02-11: 1 g via INTRAVENOUS
  Filled 2016-02-11: qty 100

## 2016-02-11 MED ORDER — INSULIN ASPART 100 UNIT/ML ~~LOC~~ SOLN
0.0000 [IU] | Freq: Three times a day (TID) | SUBCUTANEOUS | Status: DC
Start: 2016-02-11 — End: 2016-02-13
  Administered 2016-02-11: 5 [IU] via SUBCUTANEOUS
  Administered 2016-02-11: 2 [IU] via SUBCUTANEOUS
  Administered 2016-02-12: 8 [IU] via SUBCUTANEOUS
  Administered 2016-02-12: 5 [IU] via SUBCUTANEOUS
  Administered 2016-02-12: 8 [IU] via SUBCUTANEOUS
  Administered 2016-02-13: 11 [IU] via SUBCUTANEOUS
  Administered 2016-02-13: 5 [IU] via SUBCUTANEOUS
  Filled 2016-02-11: qty 5
  Filled 2016-02-11: qty 8
  Filled 2016-02-11: qty 5
  Filled 2016-02-11: qty 1
  Filled 2016-02-11: qty 5
  Filled 2016-02-11: qty 2
  Filled 2016-02-11: qty 11
  Filled 2016-02-11: qty 8

## 2016-02-11 MED ORDER — PROPOFOL 1000 MG/100ML IV EMUL
0.0000 ug/kg/min | INTRAVENOUS | Status: DC
  Filled 2016-02-11 (×2): qty 100

## 2016-02-11 MED ORDER — INSULIN ASPART 100 UNIT/ML ~~LOC~~ SOLN
2.0000 [IU] | SUBCUTANEOUS | Status: DC
  Administered 2016-02-11 (×2): 6 [IU] via SUBCUTANEOUS
  Filled 2016-02-11 (×2): qty 6

## 2016-02-11 MED ORDER — SODIUM CHLORIDE 0.9 % IV SOLN
2.0000 g | Freq: Three times a day (TID) | INTRAVENOUS | Status: DC
  Administered 2016-02-11: 2 g via INTRAVENOUS
  Filled 2016-02-11 (×3): qty 2

## 2016-02-11 MED ORDER — FENTANYL CITRATE (PF) 100 MCG/2ML IJ SOLN: 50.0000 ug | Freq: Once | INTRAMUSCULAR | Status: DC

## 2016-02-11 MED ORDER — DEXMEDETOMIDINE HCL IN NACL 400 MCG/100ML IV SOLN
0.0000 ug/kg/h | INTRAVENOUS | Status: DC
  Administered 2016-02-11: 1.2 ug/kg/h via INTRAVENOUS
  Filled 2016-02-11: qty 50
  Filled 2016-02-11: qty 100

## 2016-02-11 MED ORDER — FENTANYL CITRATE (PF) 100 MCG/2ML IJ SOLN
25.0000 ug | INTRAMUSCULAR | Status: DC | PRN
  Administered 2016-02-11 – 2016-02-12 (×2): 100 ug via INTRAVENOUS
  Filled 2016-02-11 (×2): qty 2

## 2016-02-11 MED ORDER — ONDANSETRON HCL 4 MG/2ML IJ SOLN: 4.0000 mg | Freq: Four times a day (QID) | INTRAMUSCULAR | Status: DC | PRN

## 2016-02-11 MED ORDER — MIDAZOLAM BOLUS VIA INFUSION
1.0000 mg | INTRAVENOUS | Status: DC | PRN
  Filled 2016-02-11: qty 2

## 2016-02-11 MED ORDER — BISACODYL 10 MG RE SUPP: 10.0000 mg | Freq: Every day | RECTAL | Status: DC | PRN

## 2016-02-11 MED ORDER — LACTATED RINGERS IV SOLN
INTRAVENOUS | Status: DC
  Administered 2016-02-11: 50 mL/h via INTRAVENOUS
  Administered 2016-02-12: 04:00:00 via INTRAVENOUS

## 2016-02-11 MED ORDER — MIDAZOLAM HCL 5 MG/5ML IJ SOLN
2.0000 mg | Freq: Once | INTRAMUSCULAR | Status: AC
  Administered 2016-02-11: 2 mg via INTRAVENOUS

## 2016-02-11 MED ORDER — MIDAZOLAM HCL 5 MG/ML IJ SOLN
0.0000 mg/h | INTRAMUSCULAR | Status: DC
  Administered 2016-02-11: 2 mg/h via INTRAVENOUS
  Filled 2016-02-11 (×2): qty 10

## 2016-02-11 NOTE — Progress Notes (Signed)
[facility] Progress Note Patient Name: Brittany Cummings DOB: [birth year] MRN: [name]   Date of Service  02/11/2016  HPI/Events of Note  Camera check on patient postextubation. Currently on room air saturating 97% with respiratory rate 17 breaths per minute. Blood pressure stable. Patient laying on right side Reported some mild back pain. No other complaints or respiratory distress at this time.  eICU Interventions  Continue ICU monitoring.     Intervention Category Major Interventions: Respiratory failure - evaluation and management  Tera Partridge 02/11/2016, 4:23 PM

## 2016-02-11 NOTE — Progress Notes (Signed)
Lab called to report pt lactic acid at 2.1. Passed on to NP.

## 2016-02-11 NOTE — ED Notes (Signed)
Pt have frequent events where she is waking up, flailing arms and trying to rip off tubing.  Titrating propofol PRN to keep pt sedated.

## 2016-02-11 NOTE — Progress Notes (Signed)
NP asked for patient to have a bedside nursing swallow study. Pt passed and was able to drink a can of Sprite and eat some graham crackers, and tolerated without coughing. Passed information on to NP who will put in a diet order.

## 2016-02-11 NOTE — H&P (Addendum)
PULMONARY / CRITICAL CARE MEDICINE   Name: Brittany Cummings MRN: [id number] DOB: 1965-11-17    ADMISSION DATE:  02/10/2016   REFERRING MD: ED  CHIEF COMPLAINT:  Acute change in mental status/agitation  HISTORY OF PRESENT ILLNESS:   This is a 50 year old Caucasian female with a past medical history of lumbar facet syndrome, type 1 diabetes, hypertension, diabetic neuropathy, depression, and spondylolisthesis of the lumbar spine who presented to the ED via EMS with sudden change in mental status and severe agitation necessitating intubation. History is obtained from EMS/ED records as patient is currently intubated. EMS was called to the residence by patient's husband who indicated that patient suddenly became confused, with mumbled speech and difficulty walking. Per husband, patient was was lying in bed, complaining of back pain, but not confused when he got home from work. 2 hours later. Patient got out of bed, staggered into the living room and collapsed in his arms. Upon EMS arrival, patient became extremely combinative, fighting and biting staff. She was given 2 doses of Narcan without effect. She was given a total of 10 mg of Haldol and 2 mg of Ativan en route to the ED. At the ED, patient remained severely combinative and hence the decision was made to intubate her for airway protection and for safety. A CT head without contrast was done that was negative. Her labs were grossly unremarkable except for a mildly elevated lactic acid level and a WBC of 11.6. Her toxicology report negative for salicylates, Tylenol and alcohol but positive for benzodiazepine and tricyclics. Of note, patient has an extensive history of chronic pain-neck and back.She had a cervical epidural injection on March 30 and a lumbar bilateral facet block on April 27 at the pain management clinic. She reported no improvement in symptoms post procedure. Based on the pain clinic post operative note, patient left the clinic very upset  because she was not given pain medications even though it had been found that she had a prescription for  Hydrocodone/APAP 7.5/325 from another provider filled prior to the procedure.  She has an extensive psychiatric history with prior suicidal ideations and currently on multiple psychiatric medications.  PAST MEDICAL HISTORY :  She  has a past medical history of Spondylolisthesis; Diabetes mellitus without complication ([place]); Hypertension; Spinal headache; Depression; Seasonal allergies; Diabetic neuropathy ([place]); GERD (gastroesophageal reflux disease); Multiple gastric ulcers; Arthritis; Rib fracture; Cervical disc disease (02/08/2014); and Spondylolisthesis of lumbar region (09/15/2015).  PAST SURGICAL HISTORY: She  has past surgical history that includes Gastric bypass; Back surgery; Breast reduction surgery; Appendectomy; Carpal tunnel release; Joint replacement; Knee arthroscopy; Cataract extraction; abdominal abscess excision; Diagnostic laparoscopy; Cholecystectomy; Hernia repair; Colonoscopy w/ biopsies and polypectomy; Wisdom tooth extraction; and Spinal fusion (09-15-2015).  Allergies  Allergen Reactions  . Meperidine Nausea And Vomiting    Current Facility-Administered Medications on File Prior to Encounter  Medication  . fentaNYL (SUBLIMAZE) bolus via infusion 25-50 mcg  . lactated ringers infusion 1,000 mL  . lidocaine (PF) (XYLOCAINE) 1 % injection 10 mL  . midazolam (VERSED) bolus via infusion 1-2 mg  . ropivacaine (PF) 2 mg/ml (0.2%) (NAROPIN) epidural 9 mL  . triamcinolone acetonide (KENALOG-40) injection 40 mg   Current Outpatient Prescriptions on File Prior to Encounter  Medication Sig  . azelastine (ASTELIN) 0.1 % nasal spray Place 1 spray into both nostrils 2 (two) times daily as needed for rhinitis.   . Calcium Carb-Cholecalciferol (CALCIUM 600/VITAMIN D3) 600-800 MG-UNIT TABS Take 1 tablet by mouth daily.   Marland Kitchen  cyclobenzaprine (FLEXERIL) 10 MG tablet Take 1 tablet (10  mg total) by mouth 3 (three) times daily as needed for muscle spasms.  . diclofenac sodium (VOLTAREN) 1 % GEL Apply 2 g topically 4 (four) times daily as needed (for pain). Pt applies to her back.  . DULoxetine (CYMBALTA) 60 MG capsule Take 60 mg by mouth 2 (two) times daily.  Marland Kitchen estradiol-norethindrone (MIMVEY) 1-0.5 MG tablet Take 1 tablet by mouth daily.   . fluticasone (FLONASE) 50 MCG/ACT nasal spray Place 2 sprays into both nostrils daily.  Marland Kitchen gabapentin (NEURONTIN) 300 MG capsule Take 300 mg by mouth at bedtime as needed (for pain).   . metoprolol succinate (TOPROL-XL) 100 MG 24 hr tablet Take 100 mg by mouth daily.   . pantoprazole (PROTONIX) 40 MG tablet Take 40 mg by mouth daily.   Marland Kitchen PARoxetine (PAXIL) 20 MG tablet Take 20 mg by mouth daily.   . QUEtiapine (SEROQUEL) 100 MG tablet Take 100 mg by mouth at bedtime.   . sucralfate (CARAFATE) 1 G tablet Take 1 g by mouth 4 (four) times daily -  with meals and at bedtime.    FAMILY HISTORY:  Her indicated that her mother is deceased. She indicated that her father is deceased. She indicated that her sister is alive.   SOCIAL HISTORY: She  reports that she has been smoking Cigarettes.  She has a 25 pack-year smoking history. She has never used smokeless tobacco. She reports that she does not drink alcohol or use illicit drugs.  REVIEW OF SYSTEMS:   Unable to obtain as patient is intubated and sedated  SUBJECTIVE:   VITAL SIGNS: BP 107/68 mmHg  Pulse 62  Resp 19  SpO2 100%  LMP 11/13/2009  HEMODYNAMICS:    VENTILATOR SETTINGS: Vent Mode:  [-] AC FiO2 (%):  [28 %-50 %] 28 % Set Rate:  [16 bmp] 16 bmp Vt Set:  [500 mL] 500 mL PEEP:  [5 cmH20] 5 cmH20  INTAKE / OUTPUT:    PHYSICAL EXAMINATION: General: Sedated Neuro: Withdraws to noxious stimulus, positive gag reflex HEENT: Pupils pinpoint, sluggish, oral mucosa moist and pink, trachea midline, ET tube Cardiovascular:  Rate and rhythm regular, S1, S2, no murmur, gallop or  regurg Lungs:  Bilateral airflow, coarse rhonchi anteriorly Abdomen: Obese, normal bowel sounds, no palpable organomegaly Musculoskeletal:  No joint deformities, positive range of motion Extremities: +2 pulses Skin:  Warm and dry  LABS:  BMET  Recent Labs Lab 02/10/16 2146  NA 137  K 3.7  CL 107  CO2 21*  BUN 13  CREATININE 0.63  GLUCOSE 363*    Electrolytes  Recent Labs Lab 02/10/16 2146  CALCIUM 8.2*    CBC  Recent Labs Lab 02/10/16 2146  WBC 11.6*  HGB 13.3  HCT 39.5  PLT 179    Coag's No results for input(s): APTT, INR in the last 168 hours.  Sepsis Markers  Recent Labs Lab 02/10/16 2227  LATICACIDVEN 2.2*    ABG  Recent Labs Lab 02/10/16 2145  PHART 7.40  PCO2ART 35  PO2ART 226*    Liver Enzymes  Recent Labs Lab 02/10/16 2146  AST 20  ALT 16  ALKPHOS 68  BILITOT 0.3  ALBUMIN 3.4*    Cardiac Enzymes  Recent Labs Lab 02/10/16 2146  TROPONINI <0.03    Glucose No results for input(s): GLUCAP in the last 168 hours.  Imaging Ct Head Wo Contrast  02/10/2016  CLINICAL DATA:  Altered mental status. EXAM: CT HEAD WITHOUT  CONTRAST TECHNIQUE: Contiguous axial images were obtained from the base of the skull through the vertex without intravenous contrast. COMPARISON:  Brain MRI 03/11/2009 FINDINGS: Skull and Sinuses:Negative for fracture or destructive process. The visualized mastoids, middle ears, and imaged paranasal sinuses are clear. Visualized orbits: Right cataract resection.  No acute finding Brain: No evidence of acute infarction, hemorrhage, hydrocephalus, or mass lesion/mass effect. Incidental cavum velum interpositum cyst. IMPRESSION: Negative study. Electronically Signed   By: Monte Fantasia M.D.   On: 02/10/2016 23:19   Dg Chest Portable 1 View  02/10/2016  CLINICAL DATA:  50 year old female status post endotracheal tube placement. Altered mental status. EXAM: PORTABLE CHEST 1 VIEW COMPARISON:  Chest x-ray 09/01/2015.  FINDINGS: An endotracheal tube is in place with tip 3.9 cm above the carina. A nasogastric tube is seen extending into the stomach, however, the tip of the nasogastric tube extends below the lower margin of the image. Lung volumes are low. No consolidative airspace disease. No pleural effusions. No pneumothorax. No pulmonary nodule or mass noted. Pulmonary vasculature and the cardiomediastinal silhouette are within normal limits. IMPRESSION: 1. Support apparatus, as above. 2. Low lung volumes without radiographic evidence of acute cardiopulmonary disease. Electronically Signed   By: Vinnie Langton M.D.   On: 02/10/2016 22:57    STUDIES:  None  CULTURES:  02/02/2016 Blood cultures 2. Urine culture Sputum culture  ANTIBIOTICS:  Meropenem 02/11/2016  SIGNIFICANT EVENTS: 02/09/2016: Lumbar facet block  LINES/TUBES: PIVs ETT 04/28>  DISCUSSION: 50 year old female presenting with acute encephalopathy with severe agitation and acute confusions now intubated and sedated for airway protection. Differential diagnosis include overdose-accidental versus intentional, menigitis and serotonine syndrome. Tox screen positive for benzodiazepine, and tricyclic antidepressants. She is on duloxetine, paroxetine, and quetiapine at home and was given ativan by EMS. She has been "doctor shopping" for pain medications based on pain clinic notes. Given that she recently had 2 consecutive spinal procedures, the possibility of meningitis cannot be completely eliminated even though she is a febrile, with a mild leukocytosis and no nocturnal rigidity.  ASSESSMENT / PLAN:  PULMONARY A: Intubated for airway protection Current daily smoker P:   -Full vent support with current settings -VAP protocol -Nebulized albuterol. -Daily chest x-ray. -Wean as tolerated  CARDIOVASCULAR A:  History of hypertension P:  -Hold all oral antihypertensives  RENAL A:   No acute issues P:   -Monitor and replace  electrolytes  GASTROINTESTINAL A:   No acute issues P:   -Protonix for stress ulcer  HEMATOLOGIC A:   No acute issues P:  -Heparin for VTE prophylaxis  INFECTIOUS A:   Rule out meningitis P:   -Follow-up cultures -Meropenem empirically until cultures are available -Check procalcitonin level and trend lactic acid level. -Will consider a lumbar puncture to rule out meningitis if patient's mental status does not improve in the morning.  ENDOCRINE A:   History of type 1 diabetes Hyprglycemia P:   -Blood glucose testing with sliding-scale insulin coverage per ICU protocol  NEUROLOGIC A:   Acute encephalopathy-unclear etiology; CT head negative Possible overdose-intentional versus accidental, patient has a prior history of suicidal ideation Severe agitation Depression Polypharmacy with multiple psychotropic medications Vent sedation-requiring high doses of sedatives P:   RASS goal: . -1 to -2 -Neuro checks per protocol -Hold all psychotropic medications. -Fentanyl and Versed for vent sedation, titrate off propofol. -Safety precautions, patient will require a sitter at the bedside once extubated -patient will certainly benefit from a psychiatric evaluation; will initiate once extubated  Disposition and family update:  No family at bedside. Further changes in treatment plan pending patient's clinical course and diagnostics  Best Practice: Code Status:  Full. Diet:  Nothing by mouth GI prophylaxis:  PPI. VTE prophylaxis:  SCD's / heparin.  Critical care time spent examining patient, establishing treatment plan, managing vent, reviewing history and labs, CXR, and ABG interpretation is 60 minutes  Magdalene S. [facility] ANP-BC Pulmonary and [place] Pager [phone]  02/11/2016, 1:16 AM  PCCM ATTENDING ATTESTATION:  I have evaluated patient with ANP Patria Mane, reviewed database in its entirety and discussed care plan in detail. In  addition, this patient was discussed on multidisciplinary rounds.   Important exam findings: RASS -1, + F/C, calm on dexmedetomidine and fentanyl infusions Passed SBT Extubated and tolerating well PCT normal - doubt bacterial meningitis  Major problems addressed by PCCM team: Severe agitated delirium - likely due to some form of intoxication Acute respiratory failure - intubated due to severe agitation. Now resolved  PLAN/REC: Monitor in ICU post extubation Wean dexmedetomidine as able Cont PRN fentanyl DC meropenem   Merton Border, MD PCCM service Mobile [phone] Pager [phone]

## 2016-02-11 NOTE — Progress Notes (Signed)
Extubated without complications 

## 2016-02-11 NOTE — Progress Notes (Signed)
Pt has been stable on vent this shift. Propofol stopped per NP. Continued on Versed and Fentanyl. FBS monitoring, covered with sliding scale once this shift. Foley patent and intact. No combative/aggressive behaviors noted this shift. Husband was at bedside during admission and content with patient care at this time.

## 2016-02-11 NOTE — Progress Notes (Signed)
Pharmacy Antibiotic Note  Brittany Cummings is a 50 y.o. female admitted on 02/10/2016 with possible meningitis s/p epidural injection 02/09/16.  Pharmacy has been consulted for meropenem dosing.  Plan: Meropenem 2 gm IV Q8H  Height: 5\' 3"  (160 cm) Weight: 183 lb 3.2 oz (83.1 kg) IBW/kg (Calculated) : 52.4  Temp (24hrs), Avg:97.9 F (36.6 C), Min:97.9 F (36.6 C), Max:97.9 F (36.6 C)   Recent Labs Lab 02/10/16 2146 02/10/16 2227 02/11/16 0330  WBC 11.6*  --  11.1*  CREATININE 0.63  --  0.59  LATICACIDVEN  --  2.2*  --     Estimated Creatinine Clearance: 85 mL/min (by C-G formula based on Cr of 0.59).    Allergies  Allergen Reactions  . Meperidine Nausea And Vomiting   Thank you for allowing pharmacy to be a part of this patient's care.  Laural Benes, Pharm.D., BCPS Clinical Pharmacist 02/11/2016 5:20 AM

## 2016-02-12 DIAGNOSIS — F332 Major depressive disorder, recurrent severe without psychotic features: Secondary | ICD-10-CM

## 2016-02-12 DIAGNOSIS — R45851 Suicidal ideations: Secondary | ICD-10-CM

## 2016-02-12 DIAGNOSIS — T424X2A Poisoning by benzodiazepines, intentional self-harm, initial encounter: Secondary | ICD-10-CM

## 2016-02-12 DIAGNOSIS — T50902D Poisoning by unspecified drugs, medicaments and biological substances, intentional self-harm, subsequent encounter: Secondary | ICD-10-CM

## 2016-02-12 LAB — GLUCOSE, CAPILLARY
GLUCOSE-CAPILLARY: 265 mg/dL — AB (ref 65–99)
GLUCOSE-CAPILLARY: 276 mg/dL — AB (ref 65–99)
Glucose-Capillary: 219 mg/dL — ABNORMAL HIGH (ref 65–99)
Glucose-Capillary: 271 mg/dL — ABNORMAL HIGH (ref 65–99)

## 2016-02-12 LAB — MAGNESIUM: Magnesium: 2.2 mg/dL (ref 1.7–2.4)

## 2016-02-12 LAB — CBC
HCT: 37.5 % (ref 35.0–47.0)
Hemoglobin: 12.6 g/dL (ref 12.0–16.0)
MCH: 28.1 pg (ref 26.0–34.0)
MCHC: 33.5 g/dL (ref 32.0–36.0)
MCV: 84 fL (ref 80.0–100.0)
Platelets: 165 10*3/uL (ref 150–440)
RBC: 4.46 MIL/uL (ref 3.80–5.20)
RDW: 13.3 % (ref 11.5–14.5)
WBC: 10 10*3/uL (ref 3.6–11.0)

## 2016-02-12 LAB — BASIC METABOLIC PANEL
Anion gap: 5 (ref 5–15)
BUN: 9 mg/dL (ref 6–20)
CALCIUM: 8 mg/dL — AB (ref 8.9–10.3)
CO2: 24 mmol/L (ref 22–32)
CREATININE: 0.59 mg/dL (ref 0.44–1.00)
Chloride: 111 mmol/L (ref 101–111)
GFR calc non Af Amer: >60 mL/min (ref >60)
Glucose, Bld: 197 mg/dL — ABNORMAL HIGH (ref 65–99)
Potassium: 4.2 mmol/L (ref 3.5–5.1)
SODIUM: 140 mmol/L (ref 135–145)

## 2016-02-12 MED ORDER — ESTRADIOL-NORETHINDRONE ACET 1-0.5 MG PO TABS: 1.0000 | ORAL_TABLET | Freq: Every day | ORAL | Status: DC

## 2016-02-12 MED ORDER — FENTANYL CITRATE (PF) 100 MCG/2ML IJ SOLN: 25.0000 ug | INTRAMUSCULAR | Status: DC

## 2016-02-12 MED ORDER — QUETIAPINE FUMARATE 100 MG PO TABS
100.0000 mg | ORAL_TABLET | Freq: Every day | ORAL | Status: DC
  Administered 2016-02-12: 100 mg via ORAL
  Filled 2016-02-12 (×2): qty 1

## 2016-02-12 MED ORDER — NICOTINE 21 MG/24HR TD PT24
21.0000 mg | MEDICATED_PATCH | Freq: Every day | TRANSDERMAL | Status: DC
  Administered 2016-02-12 – 2016-02-13 (×2): 21 mg via TRANSDERMAL
  Filled 2016-02-12 (×2): qty 1

## 2016-02-12 MED ORDER — DULOXETINE HCL 30 MG PO CPEP
30.0000 mg | ORAL_CAPSULE | Freq: Two times a day (BID) | ORAL | Status: DC
  Administered 2016-02-12: 30 mg via ORAL
  Filled 2016-02-12: qty 1

## 2016-02-12 MED ORDER — PANTOPRAZOLE SODIUM 40 MG PO TBEC
40.0000 mg | DELAYED_RELEASE_TABLET | Freq: Every day | ORAL | Status: DC
Start: 2016-02-12 — End: 2016-02-13
  Administered 2016-02-12 – 2016-02-13 (×2): 40 mg via ORAL
  Filled 2016-02-12 (×2): qty 1

## 2016-02-12 MED ORDER — GABAPENTIN 300 MG PO CAPS: 300.0000 mg | ORAL_CAPSULE | Freq: Every evening | ORAL | Status: DC | PRN

## 2016-02-12 MED ORDER — DULOXETINE HCL 60 MG PO CPEP
60.0000 mg | ORAL_CAPSULE | Freq: Two times a day (BID) | ORAL | Status: DC
  Administered 2016-02-12 – 2016-02-13 (×2): 60 mg via ORAL
  Filled 2016-02-12 (×2): qty 1

## 2016-02-12 MED ORDER — CYCLOBENZAPRINE HCL 10 MG PO TABS: 10.0000 mg | ORAL_TABLET | Freq: Three times a day (TID) | ORAL | Status: DC | PRN

## 2016-02-12 MED ORDER — NICOTINE POLACRILEX 2 MG MT GUM
2.0000 mg | CHEWING_GUM | OROMUCOSAL | Status: DC | PRN
  Administered 2016-02-12 – 2016-02-13 (×2): 2 mg via ORAL
  Filled 2016-02-12 (×4): qty 1

## 2016-02-12 MED ORDER — FENTANYL CITRATE (PF) 100 MCG/2ML IJ SOLN: 25.0000 ug | INTRAMUSCULAR | Status: DC | PRN

## 2016-02-12 MED ORDER — METOPROLOL SUCCINATE ER 50 MG PO TB24
100.0000 mg | ORAL_TABLET | Freq: Every day | ORAL | Status: DC
Start: 2016-02-12 — End: 2016-02-12

## 2016-02-12 MED ORDER — ENOXAPARIN SODIUM 40 MG/0.4ML ~~LOC~~ SOLN
40.0000 mg | SUBCUTANEOUS | Status: DC
  Administered 2016-02-12 – 2016-02-13 (×2): 40 mg via SUBCUTANEOUS
  Filled 2016-02-12 (×2): qty 0.4

## 2016-02-12 MED ORDER — HYDROCODONE-ACETAMINOPHEN 7.5-325 MG PO TABS
1.0000 | ORAL_TABLET | ORAL | Status: DC | PRN
  Administered 2016-02-12 – 2016-02-13 (×6): 1 via ORAL
  Filled 2016-02-12 (×6): qty 1

## 2016-02-12 NOTE — BH Assessment (Signed)
Writer has made Psych MD (Dr. Weber Cooks) aware of consult and patient being moved from CCU to [facility].

## 2016-02-12 NOTE — Progress Notes (Signed)
PULMONARY / CRITICAL CARE MEDICINE   Name: Brittany Cummings MRN: [id number] DOB: 1965-11-15    ADMISSION DATE:  02/10/2016   REFERRING MD: ED  CHIEF COMPLAINT:  Acute change in mental status/agitation  HISTORY OF PRESENT ILLNESS:   This is a 50 year old Caucasian female with a past medical history of lumbar facet syndrome, type 1 diabetes, hypertension, diabetic neuropathy, depression, and spondylolisthesis of the lumbar spine who presented to the ED via EMS with sudden change in mental status and severe agitation necessitating intubation. History is obtained from EMS/ED records as patient is currently intubated. EMS was called to the residence by patient's husband who indicated that patient suddenly became confused, with mumbled speech and difficulty walking. Per husband, patient was was lying in bed, complaining of back pain, but not confused when he got home from work. 2 hours later. Patient got out of bed, staggered into the living room and collapsed in his arms. Upon EMS arrival, patient became extremely combinative, fighting and biting staff. She was given 2 doses of Narcan without effect. She was given a total of 10 mg of Haldol and 2 mg of Ativan en route to the ED. At the ED, patient remained severely combinative and hence the decision was made to intubate her for airway protection and for safety. A CT head without contrast was done that was negative. Her labs were grossly unremarkable except for a mildly elevated lactic acid level and a WBC of 11.6. Her toxicology report negative for salicylates, Tylenol and alcohol but positive for benzodiazepine and tricyclics. Of note, patient has an extensive history of chronic pain-neck and back.She had a cervical epidural injection on March 30 and a lumbar bilateral facet block on April 27 at the pain management clinic. She reported no improvement in symptoms post procedure. Based on the pain clinic post operative note, patient left the clinic very upset  because she was not given pain medications even though it had been found that she had a prescription for  Hydrocodone/APAP 7.5/325 from another provider filled prior to the procedure.  She has an extensive psychiatric history with prior suicidal ideations and currently on multiple psychiatric medications.  SUBJECTIVE: toerlating post-extubation. Passed bedside swallow eval and tolerating fluids and solids. States that she was tired of being in pain and since her pain specialist wasn't attending to her pain needs, she decided she wasn't going to be a burden anymore and so she took ten tablets of metoprolol and 15 tablets of seroquel. She states that she is surprised to be alive. She wants to go home this morning. She reports back pain and neck pain that is relieved with prn fentanyl  VITAL SIGNS: BP 121/81 mmHg  Pulse 72  Temp(Src) 98.9 F (37.2 C) (Oral)  Resp 14  Ht 5\' 3"  (1.6 m)  Wt 181 lb 3.5 oz (82.2 kg)  BMI 32.11 kg/m2  SpO2 96%  LMP 11/13/2009  HEMODYNAMICS:    VENTILATOR SETTINGS: Vent Mode:  [-] PRVC FiO2 (%):  [28 %] 28 % Vt Set:  [500 mL] 500 mL PEEP:  [5 cmH20] 5 cmH20  INTAKE / OUTPUT: I/O last 3 completed shifts: In: 1498.7 [I.V.:1498.7] Out: 3750 [Urine:3750]  PHYSICAL EXAMINATION: General: Awake Neuro: AAO X3, no focal deficits HEENT: PERRLA, oral mucosa pink Cardiovascular:  Rate and rhythm regular, S1, S2, no murmur, gallop  Lungs:  Bilateral airflow, coarse rhonchi anteriorly Abdomen: Obese, normal bowel sounds, no palpable organomegaly Musculoskeletal:  No joint deformities, positive range of motion Extremities: +2 pulses  Skin:  Warm and dry  LABS:  BMET  Recent Labs Lab 02/10/16 2146 02/11/16 0330 02/12/16 0447  NA 137 138 140  K 3.7 4.0 4.2  CL 107 112* 111  CO2 21* 20* 24  BUN 13 11 9   CREATININE 0.63 0.59 0.59  GLUCOSE 363* 249* 197*    Electrolytes  Recent Labs Lab 02/10/16 2146 02/11/16 0330 02/12/16 0447  CALCIUM 8.2* 7.5*  8.0*  MG  --  1.8 2.2  PHOS  --  2.6  --     CBC  Recent Labs Lab 02/10/16 2146 02/11/16 0330 02/12/16 0447  WBC 11.6* 11.1* 10.0  HGB 13.3 12.0 12.6  HCT 39.5 36.2 37.5  PLT 179 176 165    Coag's No results for input(s): APTT, INR in the last 168 hours.  Sepsis Markers  Recent Labs Lab 02/10/16 2227 02/11/16 0330 02/11/16 0534  LATICACIDVEN 2.2*  --  2.1*  PROCALCITON  --  <0.10  --     ABG  Recent Labs Lab 02/10/16 2145  PHART 7.40  PCO2ART 35  PO2ART 226*    Liver Enzymes  Recent Labs Lab 02/10/16 2146  AST 20  ALT 16  ALKPHOS 68  BILITOT 0.3  ALBUMIN 3.4*    Cardiac Enzymes  Recent Labs Lab 02/10/16 2146  TROPONINI <0.03    Glucose  Recent Labs Lab 02/11/16 0238 02/11/16 0750 02/11/16 1131 02/11/16 1619 02/11/16 2229 02/12/16 0701  GLUCAP 249* 215* 225* 148* 172* 265*    Imaging No results found.  STUDIES:  None  CULTURES:  02/02/2016 Blood cultures 2. Urine culture Sputum culture  ANTIBIOTICS:  Meropenem 04/29 X 1  SIGNIFICANT EVENTS: 02/09/2016: Lumbar facet block  LINES/TUBES: PIVs ETT 04/28>04/29  DISCUSSION: 50 year old female presenting with acute encephalopathy with severe agitation and acute confusions now intubated and sedated for airway protection. Differential diagnosis include overdose-accidental versus intentional, menigitis and serotonine syndrome. Tox screen positive for benzodiazepine, and tricyclic antidepressants. She is on duloxetine, paroxetine, and quetiapine at home and was given ativan by EMS. She has been "doctor shopping" for pain medications based on pain clinic notes. Given that she recently had 2 consecutive spinal procedures, the possibility of meningitis cannot be completely eliminated even though she is a febrile, with a mild leukocytosis and no nocturnal rigidity. Now admitting to intentional overdose with metoprolol and seroquel   ASSESSMENT / PLAN:  PULMONARY A: S/P  Intubated/extubation for airway protection Current daily smoker P:   -supplemental O2 prn  CARDIOVASCULAR A:  History of hypertension P:  -Resume oral antihypertensives except for BB  RENAL A:   No acute issues P:   -Monitor and replace electrolytes  GASTROINTESTINAL A:   No acute issues P:   -Protonix for stress ulcer  HEMATOLOGIC A:   No acute issues P:  -Heparin for VTE prophylaxis  INFECTIOUS A:   Rule out meningitis-CULTURES negative; clinically at baseline without signs of infections P:   -Follow-up cultures -Meropenem discontinued 4/29 -No need for LP  ENDOCRINE A:   History of type 1 diabetes Hyperglycemia P:   -Blood glucose testing with sliding-scale insulin coverage per ICU protocol  NEUROLOGIC A:   Acute encephalopathy secondary to intentional OD Intentional overdose History of suicidal ideation Severe agitation-resolved Depression Polypharmacy with multiple psychotropic medications Chronic pain syndrome P:   RASS goal: 0 Psychiatric consult-Spoke with Dr. Weber Cooks -Fentanyl prn for pain -Safety precautions, sitter at the bedside   Disposition and family update:  No family at bedside. Transfer to [employer]  floor once sitter is at bedside  Best Practice: Code Status:  Full. Diet:  Diabetic diet GI prophylaxis:  PPI. VTE prophylaxis:  SCD's / heparin.   Patient's care transferred to Hospitalist team. Dr. Leslye Peer notified  Willard S. [facility] ANP-BC Pulmonary and [place] Pager [phone]  02/12/2016, 7:52 AM   PCCM ATTENDING ATTESTATION: I have evaluated patient with ANP Patria Mane, reviewed database in its entirety and discussed care plan in detail. In addition, this patient was discussed on multidisciplinary rounds.    Merton Border, MD PCCM service Mobile [phone] Pager [phone]  02/12/2016

## 2016-02-12 NOTE — Progress Notes (Signed)
Room assignment obtained for patient to room 213, report called to receiving RN, Raquel Sarna with no further questions.  Patient currently resting in no apparent distress with husband and suicide sitter at bedside. Psych consult pending.

## 2016-02-12 NOTE — Progress Notes (Signed)
Pt has been alert and oriented this shift. Spoke with this nurse and explained that she felt numb because she did not think she would be alive and "slept a day away." Pt voiced she was tired of always being in pain and felt she was a burden to her husband. Passed on to NP that discussion with patient revealed suicidal idealation and should be consulted with Psych before being discharged home.

## 2016-02-12 NOTE — Progress Notes (Signed)
Patient ID: JANIAH CLY, female   DOB: [birth year], 50 y.o.   MRN: [name]  Obtained sign out from critical care specialist physicians assistant today. Since they wrote a note today, hospitalists will take over care tomorrow. All pages till 7 AM tomorrow will go to the critical care specialist.  Dr. Loletha Grayer

## 2016-02-12 NOTE — Consult Note (Signed)
[facility] Consult   Reason for Consult:  Consult for 50 year old woman who came into the hospital after an intentional overdose Referring Physician:  Rosita Fire Patient Identification: Brittany Cummings MRN:  [id number] Principal Diagnosis: Severe recurrent major depression without psychotic features [facility]) Diagnosis:   Patient Active Problem List   Diagnosis Date Noted  . Severe recurrent major depression without psychotic features ([place]) [F33.2] 02/12/2016  . Suicidal ideation [R45.851] 02/12/2016  . Intentional benzodiazepine overdose ([place]) [T42.4X2A] 02/12/2016  . Acute hypercapnic respiratory failure ([place]) [J96.02] 02/11/2016  . Suicidal ideations [R45.851] 01/11/2016  . Clinical depression [F32.9] 01/11/2016  . Lumbar foraminal stenosis (Severe) (L4-5) (Left) [M48.06] 01/11/2016  .  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 Cervical herniated disc (C5-6 and C6-7) [M50.20] 01/11/2016  . Thoracic disc herniation (T1-2) (Left) [M51.24] 01/11/2016  . Moderate episode of recurrent major depressive disorder ([place]) [F33.1] 01/11/2016  . Pain management [Z51.89] 01/11/2016  . Recurrent major depressive disorder, in partial remission ([place]) [F33.41] 12/21/2015  . Adenomatous polyp [D36.9] 12/13/2015  . Glaucoma [H40.9] 12/13/2015  . Headache, migraine [G43.909] 12/13/2015  . Chronic pain [G89.29] 12/13/2015  . Chronic pain syndrome [G89.4] 12/13/2015  . Long term current use of opiate analgesic [Z79.891] 12/13/2015  . Long term prescription opiate use [Z79.899] 12/13/2015  . Opiate use (100  MME/Day) [F11.90] 12/13/2015  . Encounter for therapeutic drug level monitoring [Z51.81] 12/13/2015  . Encounter for pain management planning [Z01.89] 12/13/2015  . Chronic abdominal pain (epigastric) [R10.13, G89.29] 12/13/2015  . Cervical spondylosis [M47.812] 12/13/2015  . Lumbar spondylosis [M47.816] 12/13/2015  . Avitaminosis D [E55.9] 12/13/2015   . 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 Lumbar facet hypertrophy (Severe at L4-5) [T65.465] 12/13/2015  . Chronic neck pain (Bilateral) (L>R) [M54.2, G89.29] 12/13/2015  . Cervicogenic headache (Left) [R51] 12/13/2015  . Occipital neuralgia (Left) [M54.81] 12/13/2015  . Chronic shoulder pain (Bilateral) (R>L) [M25.512, G89.29, M25.511] 12/13/2015  . Chronic cervical radicular pain (Bilateral) (L>R) [M54.12, G89.29] 12/13/2015  . Chronic low back pain (Location of Primary Source of Pain) (Bilateral) (R>L) [M54.5, G89.29] 12/13/2015  . Lumbar facet syndrome (Location of Primary Source of Pain) (Bilateral) (R>L) [M54.5] 12/13/2015  . Chronic knee pain (Location of Secondary source of pain) (Bilateral) (R>L) [M25.561, M25.562, G89.29] 12/13/2015  . Chronic lumbar radicular pain (Left) [M54.16, G89.29] 12/13/2015  . Lumbosacral radiculopathy at L4 (Left) [M54.16] 12/13/2015  . Chronic lower extremity pain (Left) [M79.606, G89.29] 12/13/2015  . Posture arthritis of knee (Location of Secondary source of pain) (Bilateral) (R>L) [M17.0] 12/13/2015  . Chronic foot pain (Bilateral) (L>R) [K35.465, G89.29] 12/13/2015  . Chronic hip pain (Bilateral) (R>L) [M25.559, G89.29] 12/13/2015  . Acne inversa [L73.2] 10/18/2014  . Type 1 diabetes mellitus ([place]) [E10.9] 02/08/2014  . BP (high blood pressure) [I10] 02/08/2014  . Temporary cerebral vascular dysfunction [G93.9] 02/08/2014  . Current tobacco use [Z72.0] 02/08/2014    Total Time spent with patient: 1 hour  Subjective:   Brittany Cummings is a 50 y.o. female patient admitted with "I couldn't take living with pain".  HPI:  Patient interviewed. Chart reviewed. Some information from her husband as well. All notes reviewed vitals and labs reviewed. 50 year old woman came to the hospital after her  husband called 911. The report is that she came out of her room at home clearly very confused  and unsteady and then collapsed on the floor. He called 911 and when they came she was agitated to the point that she had to be sedated to be brought into the hospital. Patient says that she took an intentional overdose with intention to kill her self. She claims that she took Toprol and Seroquel in large amounts as part of her overdose. She denies that she took any other drugs with it. Her drug screen is positive for benzodiazepines but she denies that she took any of those. She states that her mood is been depressed for years but things of been worse for the last few weeks. Major stress is a worsening of her pain. She recently had an injection in her back in the pain clinic and felt like the injection made her pain worse. Furthermore she says that she had expected that the pain management doctors would give her more narcotics if her backing pain got worse and they did not do this. She felt hopeless and overwhelmed and has made statements about being a burden on her family. Denies that she's having any psychotic symptoms. Patient was very agitated and confused and required sedation when she came to the emergency room. She says there have been some recent changes in her psychiatric medicine. Her primary care doctor had decreased her Cymbalta and started her on bupropion but that did not help so recently she has gone back to her full 120 mg of Cymbalta and added Paxil  Social history: Patient lives with her husband. No children. She does not work outside the home. Reports and appears to have a good relationship with her husband.  Medical history: Chronic pain primarily back pain also neck pain. Has been on chronic narcotics for years. Recently started going to the pain clinic at our hospital. Reports that she's had very bad responses to facet injections in the past.  Substance abuse history: Patient is on chronic  long-term narcotics. Not clear that she's ever actually abuse them. No evidence of that that I can see. She denies that she drinks and denies that she uses any other drugs.  Past Psychiatric History: Long-standing depression going back probably decades. She had 1 previous suicide attempt about 20 years ago and has also had prior admissions to psychiatric hospitals in the past. In the past she used to get psychiatric care through [facility] but in recent years has been following up only with her primary care doctor. Recent medicine was Cymbalta 60 mg twice a day chronically. No reported history of mania.  Risk to Self: Is patient at risk for suicide?: No Risk to Others:   Prior Inpatient Therapy:   Prior Outpatient Therapy:    Past Medical History:  Past Medical History  Diagnosis Date  . Spondylolisthesis   . Diabetes mellitus without complication ([place])   . Hypertension   . Spinal headache     migraines  . Depression   . Seasonal allergies   . Diabetic neuropathy ([place])   . GERD (gastroesophageal reflux disease)   . Multiple gastric ulcers   . Arthritis   . Rib fracture     right side  . Cervical disc disease 02/08/2014  . Spondylolisthesis of lumbar region 09/15/2015    Past Surgical History  Procedure Laterality Date  . Gastric bypass    . Back surgery    . Breast reduction surgery    . Appendectomy    . Carpal tunnel release      right   .  Joint replacement      left knee  . Knee arthroscopy      Left  . Cataract extraction      right  . Abdominal abscess excision    . Diagnostic laparoscopy      exploratory lap and LOA  . Cholecystectomy    . Hernia repair    . Colonoscopy w/ biopsies and polypectomy    . Wisdom tooth extraction    . Spinal fusion  09-15-2015    L4-5   Family History:  Family History  Problem Relation Age of Onset  . Cancer Mother   . Cancer - Colon Father   . Diabetes Other   . Stroke Other   . Hypertension Other     Family Psychiatric  History: Patient states that her father had problems with depression as did one first-degree cousin. Social History:  History  Alcohol Use No     History  Drug Use No    Social History   Social History  . Marital Status: Married    Spouse Name: N/A  . Number of Children: N/A  . Years of Education: N/A   Social History Main Topics  . Smoking status: Current Every Day Smoker -- 1.00 packs/day for 25 years    Types: Cigarettes  . Smokeless tobacco: Never Used  . Alcohol Use: No  . Drug Use: No  . Sexual Activity: Not Asked   Other Topics Concern  . None   Social History Narrative   Additional Social History:    Allergies:   Allergies  Allergen Reactions  . Meperidine Nausea And Vomiting    Labs:  Results for orders placed or performed during the hospital encounter of 02/10/16 (from the past 48 hour(s))  Blood gas, arterial     Status: Abnormal   Collection Time: 02/10/16  9:45 PM  Result Value Ref Range   FIO2 0.50    Delivery systems VENTILATOR    Mode ASSIST CONTROL    VT 500 mL   Peep/cpap 5.0 cm H20   pH, Arterial 7.40 7.350 - 7.450   pCO2 arterial 35 32.0 - 48.0 mmHg   pO2, Arterial 226 (H) 83.0 - 108.0 mmHg   Bicarbonate 21.7 21.0 - 28.0 mEq/L   Acid-base deficit 2.5 (H) 0.0 - 2.0 mmol/L   O2 Saturation 99.8 %   Patient temperature 37.0    Collection site LEFT BRACHIAL    Sample type ARTERIAL DRAW    Allens test (pass/fail) PASS PASS   Mechanical Rate 16   Acetaminophen level     Status: Abnormal   Collection Time: 02/10/16  9:46 PM  Result Value Ref Range   Acetaminophen (Tylenol), Serum <10 (L) 10 - 30 ug/mL    Comment:        THERAPEUTIC CONCENTRATIONS VARY SIGNIFICANTLY. A RANGE OF 10-30 ug/mL MAY BE AN EFFECTIVE CONCENTRATION FOR MANY PATIENTS. HOWEVER, SOME ARE BEST TREATED AT CONCENTRATIONS OUTSIDE THIS RANGE. ACETAMINOPHEN CONCENTRATIONS >150 ug/mL AT 4 HOURS AFTER INGESTION AND >50 ug/mL AT 12 HOURS AFTER  INGESTION ARE OFTEN ASSOCIATED WITH TOXIC REACTIONS.   Comprehensive metabolic panel     Status: Abnormal   Collection Time: 02/10/16  9:46 PM  Result Value Ref Range   Sodium 137 135 - 145 mmol/L   Potassium 3.7 3.5 - 5.1 mmol/L   Chloride 107 101 - 111 mmol/L   CO2 21 (L) 22 - 32 mmol/L   Glucose, Bld 363 (H) 65 - 99 mg/dL   BUN 13  6 - 20 mg/dL   Creatinine, Ser 0.63 0.44 - 1.00 mg/dL   Calcium 8.2 (L) 8.9 - 10.3 mg/dL   Total Protein 6.5 6.5 - 8.1 g/dL   Albumin 3.4 (L) 3.5 - 5.0 g/dL   AST 20 15 - 41 U/L   ALT 16 14 - 54 U/L   Alkaline Phosphatase 68 38 - 126 U/L   Total Bilirubin 0.3 0.3 - 1.2 mg/dL   GFR calc non Af Amer >60 >60 mL/min   GFR calc Af Amer >60 >60 mL/min    Comment: (NOTE) The eGFR has been calculated using the CKD EPI equation. This calculation has not been validated in all clinical situations. eGFR's persistently <60 mL/min signify possible Chronic Kidney Disease.    Anion gap 9 5 - 15  Ethanol     Status: None   Collection Time: 02/10/16  9:46 PM  Result Value Ref Range   Alcohol, Ethyl (B) <5 <5 mg/dL    Comment:        LOWEST DETECTABLE LIMIT FOR SERUM ALCOHOL IS 5 mg/dL FOR MEDICAL PURPOSES ONLY   Brain natriuretic peptide     Status: None   Collection Time: 02/10/16  9:46 PM  Result Value Ref Range   B Natriuretic Peptide 77.0 0.0 - 150.5 pg/mL  Salicylate level     Status: None   Collection Time: 02/10/16  9:46 PM  Result Value Ref Range   Salicylate Lvl <6.9 2.8 - 30.0 mg/dL  Troponin I     Status: None   Collection Time: 02/10/16  9:46 PM  Result Value Ref Range   Troponin I <0.03 <0.031 ng/mL    Comment:        NO INDICATION OF MYOCARDIAL INJURY.   CBC with Differential     Status: Abnormal   Collection Time: 02/10/16  9:46 PM  Result Value Ref Range   WBC 11.6 (H) 3.6 - 11.0 K/uL   RBC 4.82 3.80 - 5.20 MIL/uL   Hemoglobin 13.3 12.0 - 16.0 g/dL   HCT 39.5 35.0 - 47.0 %   MCV 82.0 80.0 - 100.0 fL   MCH 27.7 26.0 - 34.0 pg    MCHC 33.8 32.0 - 36.0 g/dL   RDW 13.2 11.5 - 14.5 %   Platelets 179 150 - 440 K/uL   Neutrophils Relative % 88 %   Lymphocytes Relative 7 %   Monocytes Relative 5 %   Eosinophils Relative 0 %   Basophils Relative 0 %   Neutro Abs 10.2 (H) 1.4 - 6.5 K/uL   Lymphs Abs 0.8 (L) 1.0 - 3.6 K/uL   Monocytes Absolute 0.6 0.2 - 0.9 K/uL   Eosinophils Absolute 0.0 0 - 0.7 K/uL   Basophils Absolute 0.0 0 - 0.1 K/uL   Smear Review MORPHOLOGY UNREMARKABLE   Sedimentation rate     Status: None   Collection Time: 02/10/16  9:46 PM  Result Value Ref Range   Sed Rate 8 0 - 30 mm/hr  Triglycerides     Status: None   Collection Time: 02/10/16  9:46 PM  Result Value Ref Range   Triglycerides 141 <150 mg/dL  Pregnancy, urine     Status: None   Collection Time: 02/10/16  9:47 PM  Result Value Ref Range   Preg Test, Ur NEGATIVE NEGATIVE  Urinalysis complete, with microscopic     Status: Abnormal   Collection Time: 02/10/16  9:47 PM  Result Value Ref Range   Color, Urine STRAW (A) YELLOW  APPearance CLEAR (A) CLEAR   Glucose, UA >500 (A) NEGATIVE mg/dL   Bilirubin Urine NEGATIVE NEGATIVE   Ketones, ur NEGATIVE NEGATIVE mg/dL   Specific Gravity, Urine 1.010 1.005 - 1.030   Hgb urine dipstick NEGATIVE NEGATIVE   pH 6.0 5.0 - 8.0   Protein, ur NEGATIVE NEGATIVE mg/dL   Nitrite NEGATIVE NEGATIVE   Leukocytes, UA NEGATIVE NEGATIVE   RBC / HPF 0-5 0 - 5 RBC/hpf   WBC, UA 0-5 0 - 5 WBC/hpf   Bacteria, UA NONE SEEN NONE SEEN   Squamous Epithelial / LPF NONE SEEN NONE SEEN  Urine Drug Screen, Qualitative     Status: Abnormal   Collection Time: 02/10/16  9:47 PM  Result Value Ref Range   Tricyclic, Ur Screen POSITIVE (A) NONE DETECTED   Amphetamines, Ur Screen NONE DETECTED NONE DETECTED   MDMA (Ecstasy)Ur Screen NONE DETECTED NONE DETECTED   Cocaine Metabolite,Ur [place] NONE DETECTED NONE DETECTED   Opiate, Ur Screen NONE DETECTED NONE DETECTED   Phencyclidine (PCP) Ur S NONE DETECTED NONE  DETECTED   Cannabinoid 50 Ng, Ur [place] NONE DETECTED NONE DETECTED   Barbiturates, Ur Screen NONE DETECTED NONE DETECTED   Benzodiazepine, Ur Scrn POSITIVE (A) NONE DETECTED   Methadone Scn, Ur NONE DETECTED NONE DETECTED    Comment: (NOTE) 563  Tricyclics, urine               Cutoff 1000 ng/mL 200  Amphetamines, urine             Cutoff 1000 ng/mL 300  MDMA (Ecstasy), urine           Cutoff 500 ng/mL 400  Cocaine Metabolite, urine       Cutoff 300 ng/mL 500  Opiate, urine                   Cutoff 300 ng/mL 600  Phencyclidine (PCP), urine      Cutoff 25 ng/mL 700  Cannabinoid, urine              Cutoff 50 ng/mL 800  Barbiturates, urine             Cutoff 200 ng/mL 900  Benzodiazepine, urine           Cutoff 200 ng/mL 1000 Methadone, urine                Cutoff 300 ng/mL 1100 1200 The urine drug screen provides only a preliminary, unconfirmed 1300 analytical test result and should not be used for non-medical 1400 purposes. Clinical consideration and professional judgment should 1500 be applied to any positive drug screen result due to possible 1600 interfering substances. A more specific alternate chemical method 1700 must be used in order to obtain a confirmed analytical result.  1800 Gas chromato graphy / mass spectrometry (GC/MS) is the preferred 1900 confirmatory method.   Urine culture     Status: None (Preliminary result)   Collection Time: 02/10/16  9:47 PM  Result Value Ref Range   Specimen Description URINE, CATHETERIZED    Special Requests NONE    Culture NO GROWTH 1 DAY    Report Status PENDING   Lactic acid, plasma     Status: Abnormal   Collection Time: 02/10/16 10:27 PM  Result Value Ref Range   Lactic Acid, Venous 2.2 (HH) 0.5 - 2.0 mmol/L    Comment: CRITICAL RESULT CALLED TO, READ BACK BY AND VERIFIED WITH IRIS GUIDRY AT 2307 ON 02/10/16 RWW   Glucose,  capillary     Status: Abnormal   Collection Time: 02/11/16  2:38 AM  Result Value Ref Range    Glucose-Capillary 249 (H) 65 - 99 mg/dL  MRSA PCR Screening     Status: None   Collection Time: 02/11/16  2:45 AM  Result Value Ref Range   MRSA by PCR NEGATIVE NEGATIVE    Comment:        The GeneXpert MRSA Assay (FDA approved for NASAL specimens only), is one component of a comprehensive MRSA colonization surveillance program. It is not intended to diagnose MRSA infection nor to guide or monitor treatment for MRSA infections.   Culture, blood (routine x 2)     Status: None (Preliminary result)   Collection Time: 02/11/16  3:30 AM  Result Value Ref Range   Specimen Description BLOOD LEFT HAND    Special Requests      BOTTLES DRAWN AEROBIC AND ANAEROBIC [place]   Culture NO GROWTH 1 DAY    Report Status PENDING   CBC     Status: Abnormal   Collection Time: 02/11/16  3:30 AM  Result Value Ref Range   WBC 11.1 (H) 3.6 - 11.0 K/uL   RBC 4.30 3.80 - 5.20 MIL/uL   Hemoglobin 12.0 12.0 - 16.0 g/dL   HCT 36.2 35.0 - 47.0 %   MCV 84.2 80.0 - 100.0 fL   MCH 27.8 26.0 - 34.0 pg   MCHC 33.0 32.0 - 36.0 g/dL   RDW 13.7 11.5 - 14.5 %   Platelets 176 150 - 440 K/uL  Basic metabolic panel     Status: Abnormal   Collection Time: 02/11/16  3:30 AM  Result Value Ref Range   Sodium 138 135 - 145 mmol/L   Potassium 4.0 3.5 - 5.1 mmol/L    Comment: HEMOLYSIS AT THIS LEVEL MAY AFFECT RESULT   Chloride 112 (H) 101 - 111 mmol/L   CO2 20 (L) 22 - 32 mmol/L   Glucose, Bld 249 (H) 65 - 99 mg/dL   BUN 11 6 - 20 mg/dL   Creatinine, Ser 0.59 0.44 - 1.00 mg/dL   Calcium 7.5 (L) 8.9 - 10.3 mg/dL   GFR calc non Af Amer >60 >60 mL/min   GFR calc Af Amer >60 >60 mL/min    Comment: (NOTE) The eGFR has been calculated using the CKD EPI equation. This calculation has not been validated in all clinical situations. eGFR's persistently <60 mL/min signify possible Chronic Kidney Disease.    Anion gap 6 5 - 15  Magnesium     Status: None   Collection Time: 02/11/16  3:30 AM  Result Value Ref  Range   Magnesium 1.8 1.7 - 2.4 mg/dL  Phosphorus     Status: None   Collection Time: 02/11/16  3:30 AM  Result Value Ref Range   Phosphorus 2.6 2.5 - 4.6 mg/dL  Procalcitonin     Status: None   Collection Time: 02/11/16  3:30 AM  Result Value Ref Range   Procalcitonin <0.10 ng/mL    Comment:        Interpretation: PCT (Procalcitonin) <= 0.5 ng/mL: Systemic infection (sepsis) is not likely. Local bacterial infection is possible. (NOTE)         ICU PCT Algorithm               Non ICU PCT Algorithm    ----------------------------     ------------------------------         PCT < 0.25 ng/mL  PCT < 0.1 ng/mL     Stopping of antibiotics            Stopping of antibiotics       strongly encouraged.               strongly encouraged.    ----------------------------     ------------------------------       PCT level decrease by               PCT < 0.25 ng/mL       >= 80% from peak PCT       OR PCT 0.25 - 0.5 ng/mL          Stopping of antibiotics                                             encouraged.     Stopping of antibiotics           encouraged.    ----------------------------     ------------------------------       PCT level decrease by              PCT >= 0.25 ng/mL       < 80% from peak PCT        AND PCT >= 0.5 ng/mL            Continuin g antibiotics                                              encouraged.       Continuing antibiotics            encouraged.    ----------------------------     ------------------------------     PCT level increase compared          PCT > 0.5 ng/mL         with peak PCT AND          PCT >= 0.5 ng/mL             Escalation of antibiotics                                          strongly encouraged.      Escalation of antibiotics        strongly encouraged.   Culture, blood (routine x 2)     Status: None (Preliminary result)   Collection Time: 02/11/16  3:37 AM  Result Value Ref Range   Specimen Description BLOOD RIGHT HAND     Special Requests BOTTLES DRAWN AEROBIC AND ANAEROBIC [id number]    Culture NO GROWTH 1 DAY    Report Status PENDING   Lactic acid, plasma     Status: Abnormal   Collection Time: 02/11/16  5:34 AM  Result Value Ref Range   Lactic Acid, Venous 2.1 (HH) 0.5 - 2.0 mmol/L    Comment: CRITICAL RESULT CALLED TO, READ BACK BY AND VERIFIED WITH FELICIA PREUDHOMME AT 4854 ON 02/11/16 RWW   Glucose, capillary     Status: Abnormal   Collection Time: 02/11/16  7:50 AM  Result Value Ref Range   Glucose-Capillary 215 (H) 65 -  99 mg/dL  Glucose, capillary     Status: Abnormal   Collection Time: 02/11/16 11:31 AM  Result Value Ref Range   Glucose-Capillary 225 (H) 65 - 99 mg/dL  Glucose, capillary     Status: Abnormal   Collection Time: 02/11/16  4:19 PM  Result Value Ref Range   Glucose-Capillary 148 (H) 65 - 99 mg/dL  Glucose, capillary     Status: Abnormal   Collection Time: 02/11/16 10:29 PM  Result Value Ref Range   Glucose-Capillary 172 (H) 65 - 99 mg/dL  CBC     Status: None   Collection Time: 02/12/16  4:47 AM  Result Value Ref Range   WBC 10.0 3.6 - 11.0 K/uL   RBC 4.46 3.80 - 5.20 MIL/uL   Hemoglobin 12.6 12.0 - 16.0 g/dL   HCT 37.5 35.0 - 47.0 %   MCV 84.0 80.0 - 100.0 fL   MCH 28.1 26.0 - 34.0 pg   MCHC 33.5 32.0 - 36.0 g/dL   RDW 13.3 11.5 - 14.5 %   Platelets 165 150 - 440 K/uL  Basic metabolic panel     Status: Abnormal   Collection Time: 02/12/16  4:47 AM  Result Value Ref Range   Sodium 140 135 - 145 mmol/L   Potassium 4.2 3.5 - 5.1 mmol/L   Chloride 111 101 - 111 mmol/L   CO2 24 22 - 32 mmol/L   Glucose, Bld 197 (H) 65 - 99 mg/dL   BUN 9 6 - 20 mg/dL   Creatinine, Ser 0.59 0.44 - 1.00 mg/dL   Calcium 8.0 (L) 8.9 - 10.3 mg/dL   GFR calc non Af Amer >60 >60 mL/min   GFR calc Af Amer >60 >60 mL/min    Comment: (NOTE) The eGFR has been calculated using the CKD EPI equation. This calculation has not been validated in all clinical situations. eGFR's persistently  <60 mL/min signify possible Chronic Kidney Disease.    Anion gap 5 5 - 15  Magnesium     Status: None   Collection Time: 02/12/16  4:47 AM  Result Value Ref Range   Magnesium 2.2 1.7 - 2.4 mg/dL  Glucose, capillary     Status: Abnormal   Collection Time: 02/12/16  7:01 AM  Result Value Ref Range   Glucose-Capillary 265 (H) 65 - 99 mg/dL  Glucose, capillary     Status: Abnormal   Collection Time: 02/12/16 12:07 PM  Result Value Ref Range   Glucose-Capillary 276 (H) 65 - 99 mg/dL   Comment 1 Notify RN     Current Facility-Administered Medications  Medication Dose Route Frequency Provider Last Rate Last Dose  . 0.9 %  sodium chloride infusion  250 mL Intravenous PRN Mikael Spray, NP      . albuterol (PROVENTIL) (2.5 MG/3ML) 0.083% nebulizer solution 2.5 mg  2.5 mg Nebulization Q4H PRN Mikael Spray, NP      . cyclobenzaprine (FLEXERIL) tablet 10 mg  10 mg Oral TID PRN Wilhelmina Mcardle, MD      . DULoxetine (CYMBALTA) DR capsule 30 mg  30 mg Oral BID Wilhelmina Mcardle, MD   30 mg at 02/12/16 1224  . enoxaparin (LOVENOX) injection 40 mg  40 mg Subcutaneous Q24H Wilhelmina Mcardle, MD   40 mg at 02/12/16 1224  . estradiol-norethindrone (ACTIVELLA) 1-0.5 MG per tablet 1 tablet  1 tablet Oral Daily Wilhelmina Mcardle, MD   1 tablet at 02/12/16 1030  . gabapentin (NEURONTIN) capsule 300 mg  300 mg Oral QHS PRN Wilhelmina Mcardle, MD      . HYDROcodone-acetaminophen [facility]) 7.5-325 MG per tablet 1 tablet  1 tablet Oral Q4H PRN Mikael Spray, NP   1 tablet at 02/12/16 1550  . insulin aspart (novoLOG) injection 0-15 Units  0-15 Units Subcutaneous TID WC Wilhelmina Mcardle, MD   8 Units at 02/12/16 1224  . insulin aspart (novoLOG) injection 0-5 Units  0-5 Units Subcutaneous QHS Wilhelmina Mcardle, MD   0 Units at 02/11/16 2229  . lactated ringers infusion   Intravenous Continuous Wilhelmina Mcardle, MD 10 mL/hr at 02/12/16 1017    . nicotine (NICODERM CQ - dosed in mg/24 hours) patch 21 mg  21 mg  Transdermal Daily Wilhelmina Mcardle, MD   21 mg at 02/12/16 1224  . nicotine polacrilex (NICORETTE) gum 2 mg  2 mg Oral PRN Wilhelmina Mcardle, MD      . ondansetron [facility]) injection 4 mg  4 mg Intravenous Q6H PRN Mikael Spray, NP      . pantoprazole (PROTONIX) EC tablet 40 mg  40 mg Oral Daily Mikael Spray, NP   40 mg at 02/12/16 1224  . QUEtiapine (SEROQUEL) tablet 100 mg  100 mg Oral QHS Wilhelmina Mcardle, MD        Musculoskeletal: Strength & Muscle Tone: decreased Gait & Station: ataxic Patient leans: N/A  Psychiatric Specialty Exam: Review of Systems  Constitutional: Negative.   Eyes: Negative.   Respiratory: Negative.   Cardiovascular: Negative.   Gastrointestinal: Negative.   Musculoskeletal: Positive for back pain and neck pain.  Skin: Negative.   Neurological: Negative.   Psychiatric/Behavioral: Positive for depression and suicidal ideas. Negative for hallucinations, memory loss and substance abuse. The patient is nervous/anxious and has insomnia.     Blood pressure 118/58, pulse 64, temperature 99.1 F (37.3 C), temperature source Oral, resp. rate 16, height _0  (1.6 m), weight 82.2 kg (181 lb 3.5 oz), last menstrual period 11/13/2009, SpO2 97 %.Body mass index is 32.11 kg/(m^2).  General Appearance: Casual  Eye Contact::  Good  Speech:  Normal Rate  Volume:  Normal  Mood:  Anxious and Depressed  Affect:  Depressed  Thought Process:  Goal Directed  Orientation:  Full (Time, Place, and Person)  Thought Content:  Negative  Suicidal Thoughts:  Yes.  with intent/plan  Homicidal Thoughts:  No  Memory:  Immediate;   Good Recent;   Fair Remote;   Fair  Judgement:  Impaired  Insight:  Shallow  Psychomotor Activity:  Decreased  Concentration:  Fair  Recall:  [employer] of Knowledge:Fair  Language: Fair  Akathisia:  No  Handed:  Right  AIMS (if indicated):     Assets:  Communication Skills Desire for Improvement Financial  Resources/Insurance Housing Social Support  ADL's:  Impaired  Cognition: WNL  Sleep:      Treatment Plan Summary: Daily contact with patient to assess and evaluate symptoms and progress in treatment, Medication management and Plan 50 year old woman who made a serious intentional suicide attempt. She also possibly may be less than honest about which she took as her drug screen is positive for benzodiazepines. Mood has been severely depressed recently. Stresses are bad and there is no indication of any improvement in her primary stresses. I think that she needs inpatient psychiatric treatment after this suicide attempt. Patient was not in agreement but her insight is poor. I have filed commitment paperwork at this time. She is  still on a Foley catheter and so apparently is not ready to be discharged from medicine. I Discussed the case with TTS and we will anticipate likely transfer to psychiatry tomorrow. I will put her back on her regular psychiatric medicines if she is not already on the mother just the Cymbalta twice a day.  Disposition: Recommend psychiatric Inpatient admission when medically cleared. Supportive therapy provided about ongoing stressors.  Alethia Berthold, MD 02/12/2016 4:10 PM

## 2016-02-13 ENCOUNTER — Inpatient Hospital Stay
Admission: EM | Admit: 2016-02-13 | Discharge: 2016-02-14 | DRG: 885 | Disposition: A | Discharge status: Home / Self Care | Payer: Medicare Other | Source: Intra-hospital | Attending: Psychiatry | Admitting: Psychiatry

## 2016-02-13 DIAGNOSIS — E1040 Type 1 diabetes mellitus with diabetic neuropathy, unspecified: Secondary | ICD-10-CM | POA: Diagnosis present

## 2016-02-13 DIAGNOSIS — F332 Major depressive disorder, recurrent severe without psychotic features: Secondary | ICD-10-CM | POA: Diagnosis present

## 2016-02-13 DIAGNOSIS — Z8711 Personal history of peptic ulcer disease: Secondary | ICD-10-CM | POA: Diagnosis not present

## 2016-02-13 DIAGNOSIS — E109 Type 1 diabetes mellitus without complications: Secondary | ICD-10-CM | POA: Diagnosis present

## 2016-02-13 DIAGNOSIS — G894 Chronic pain syndrome: Secondary | ICD-10-CM | POA: Diagnosis present

## 2016-02-13 DIAGNOSIS — Z79899 Other long term (current) drug therapy: Secondary | ICD-10-CM | POA: Diagnosis not present

## 2016-02-13 DIAGNOSIS — Z9884 Bariatric surgery status: Secondary | ICD-10-CM

## 2016-02-13 DIAGNOSIS — Z888 Allergy status to other drugs, medicaments and biological substances status: Secondary | ICD-10-CM | POA: Diagnosis not present

## 2016-02-13 DIAGNOSIS — Z8249 Family history of ischemic heart disease and other diseases of the circulatory system: Secondary | ICD-10-CM | POA: Diagnosis not present

## 2016-02-13 DIAGNOSIS — T424X2A Poisoning by benzodiazepines, intentional self-harm, initial encounter: Secondary | ICD-10-CM | POA: Diagnosis present

## 2016-02-13 DIAGNOSIS — F17200 Nicotine dependence, unspecified, uncomplicated: Secondary | ICD-10-CM | POA: Diagnosis present

## 2016-02-13 DIAGNOSIS — M545 Low back pain: Secondary | ICD-10-CM | POA: Diagnosis present

## 2016-02-13 DIAGNOSIS — Z818 Family history of other mental and behavioral disorders: Secondary | ICD-10-CM

## 2016-02-13 DIAGNOSIS — H409 Unspecified glaucoma: Secondary | ICD-10-CM | POA: Diagnosis present

## 2016-02-13 DIAGNOSIS — Z981 Arthrodesis status: Secondary | ICD-10-CM | POA: Diagnosis not present

## 2016-02-13 DIAGNOSIS — I10 Essential (primary) hypertension: Secondary | ICD-10-CM | POA: Diagnosis present

## 2016-02-13 DIAGNOSIS — Z823 Family history of stroke: Secondary | ICD-10-CM

## 2016-02-13 DIAGNOSIS — K219 Gastro-esophageal reflux disease without esophagitis: Secondary | ICD-10-CM | POA: Diagnosis present

## 2016-02-13 DIAGNOSIS — F17210 Nicotine dependence, cigarettes, uncomplicated: Secondary | ICD-10-CM | POA: Diagnosis present

## 2016-02-13 DIAGNOSIS — Z9889 Other specified postprocedural states: Secondary | ICD-10-CM | POA: Diagnosis not present

## 2016-02-13 DIAGNOSIS — R45851 Suicidal ideations: Secondary | ICD-10-CM | POA: Diagnosis present

## 2016-02-13 DIAGNOSIS — Z915 Personal history of self-harm: Secondary | ICD-10-CM

## 2016-02-13 DIAGNOSIS — Z794 Long term (current) use of insulin: Secondary | ICD-10-CM

## 2016-02-13 DIAGNOSIS — Z9049 Acquired absence of other specified parts of digestive tract: Secondary | ICD-10-CM

## 2016-02-13 DIAGNOSIS — Z833 Family history of diabetes mellitus: Secondary | ICD-10-CM

## 2016-02-13 DIAGNOSIS — Z809 Family history of malignant neoplasm, unspecified: Secondary | ICD-10-CM | POA: Diagnosis not present

## 2016-02-13 DIAGNOSIS — Z79891 Long term (current) use of opiate analgesic: Secondary | ICD-10-CM

## 2016-02-13 DIAGNOSIS — M542 Cervicalgia: Secondary | ICD-10-CM | POA: Diagnosis present

## 2016-02-13 LAB — HEMOGLOBIN A1C: Hgb A1c MFr Bld: 9 % — ABNORMAL HIGH (ref 4.0–6.0)

## 2016-02-13 LAB — GLUCOSE, CAPILLARY
GLUCOSE-CAPILLARY: 222 mg/dL — AB (ref 65–99)
GLUCOSE-CAPILLARY: 177 mg/dL — AB (ref 65–99)
Glucose-Capillary: 319 mg/dL — ABNORMAL HIGH (ref 65–99)
Glucose-Capillary: 164 mg/dL — ABNORMAL HIGH (ref 65–99)

## 2016-02-13 LAB — LIPID PANEL
Cholesterol: 141 mg/dL (ref 0–200)
HDL: 40 mg/dL — AB (ref >40)
LDL Cholesterol: 57 mg/dL (ref 0–99)
TRIGLYCERIDES: 220 mg/dL — AB (ref <150)
Total CHOL/HDL Ratio: 3.5 RATIO
VLDL: 44 mg/dL — ABNORMAL HIGH (ref 0–40)

## 2016-02-13 LAB — URINE CULTURE: CULTURE: NO GROWTH

## 2016-02-13 LAB — TSH: TSH: 1.044 u[IU]/mL (ref 0.350–4.500)

## 2016-02-13 MED ORDER — PANTOPRAZOLE SODIUM 40 MG PO TBEC
40.0000 mg | DELAYED_RELEASE_TABLET | Freq: Every day | ORAL | Status: DC
  Administered 2016-02-14: 40 mg via ORAL
  Filled 2016-02-13: qty 1

## 2016-02-13 MED ORDER — ALBUTEROL SULFATE HFA 108 (90 BASE) MCG/ACT IN AERS
2.0000 | INHALATION_SPRAY | RESPIRATORY_TRACT | Status: DC | PRN
  Filled 2016-02-13: qty 6.7

## 2016-02-13 MED ORDER — QUETIAPINE FUMARATE 100 MG PO TABS
100.0000 mg | ORAL_TABLET | Freq: Every day | ORAL | Status: DC
Start: 2016-02-13 — End: 2016-02-14
  Administered 2016-02-13: 100 mg via ORAL
  Filled 2016-02-13: qty 1

## 2016-02-13 MED ORDER — DULOXETINE HCL 30 MG PO CPEP
60.0000 mg | ORAL_CAPSULE | Freq: Two times a day (BID) | ORAL | Status: DC
  Administered 2016-02-13 – 2016-02-14 (×2): 60 mg via ORAL
  Filled 2016-02-13 (×2): qty 2

## 2016-02-13 MED ORDER — INSULIN NPH (HUMAN) (ISOPHANE) 100 UNIT/ML ~~LOC~~ SUSP: 15.0000 [IU] | Freq: Two times a day (BID) | SUBCUTANEOUS | Status: DC

## 2016-02-13 MED ORDER — INSULIN DETEMIR 100 UNIT/ML ~~LOC~~ SOLN
15.0000 [IU] | Freq: Two times a day (BID) | SUBCUTANEOUS | Status: DC
  Administered 2016-02-13 – 2016-02-14 (×2): 15 [IU] via SUBCUTANEOUS
  Filled 2016-02-13 (×5): qty 0.15

## 2016-02-13 MED ORDER — INSULIN ASPART 100 UNIT/ML ~~LOC~~ SOLN: 0.0000 [IU] | Freq: Every day | SUBCUTANEOUS | Status: DC

## 2016-02-13 MED ORDER — ALBUTEROL SULFATE (2.5 MG/3ML) 0.083% IN NEBU: 2.5000 mg | INHALATION_SOLUTION | RESPIRATORY_TRACT | Status: DC | PRN

## 2016-02-13 MED ORDER — ACETAMINOPHEN 325 MG PO TABS: 650.0000 mg | ORAL_TABLET | Freq: Four times a day (QID) | ORAL | Status: DC | PRN

## 2016-02-13 MED ORDER — INSULIN NPH (HUMAN) (ISOPHANE) 100 UNIT/ML ~~LOC~~ SUSP: 15.0000 [IU] | Freq: Two times a day (BID) | SUBCUTANEOUS | Status: AC

## 2016-02-13 MED ORDER — GABAPENTIN 300 MG PO CAPS: 300.0000 mg | ORAL_CAPSULE | Freq: Every evening | ORAL | Status: DC | PRN

## 2016-02-13 MED ORDER — MAGNESIUM HYDROXIDE 400 MG/5ML PO SUSP: 30.0000 mL | Freq: Every day | ORAL | Status: DC | PRN

## 2016-02-13 MED ORDER — INSULIN DETEMIR 100 UNIT/ML ~~LOC~~ SOLN
15.0000 [IU] | Freq: Two times a day (BID) | SUBCUTANEOUS | Status: DC
  Administered 2016-02-13: 15 [IU] via SUBCUTANEOUS
  Filled 2016-02-13 (×3): qty 0.15

## 2016-02-13 MED ORDER — HYDROCODONE-ACETAMINOPHEN 10-325 MG PO TABS
1.0000 | ORAL_TABLET | Freq: Two times a day (BID) | ORAL | Status: DC
  Administered 2016-02-13 – 2016-02-14 (×2): 1 via ORAL
  Filled 2016-02-13 (×2): qty 1

## 2016-02-13 MED ORDER — INSULIN ASPART 100 UNIT/ML ~~LOC~~ SOLN
0.0000 [IU] | Freq: Three times a day (TID) | SUBCUTANEOUS | Status: DC
  Administered 2016-02-13: 3 [IU] via SUBCUTANEOUS
  Administered 2016-02-14: 15 [IU] via SUBCUTANEOUS
  Administered 2016-02-14: 2 [IU] via SUBCUTANEOUS
  Filled 2016-02-13: qty 15
  Filled 2016-02-13: qty 2
  Filled 2016-02-13: qty 3

## 2016-02-13 MED ORDER — ESTRADIOL-NORETHINDRONE ACET 1-0.5 MG PO TABS: 1.0000 | ORAL_TABLET | Freq: Every day | ORAL | Status: DC

## 2016-02-13 MED ORDER — NICOTINE 21 MG/24HR TD PT24: 21.0000 mg | MEDICATED_PATCH | Freq: Every day | TRANSDERMAL | Status: AC

## 2016-02-13 MED ORDER — CYCLOBENZAPRINE HCL 10 MG PO TABS
10.0000 mg | ORAL_TABLET | Freq: Three times a day (TID) | ORAL | Status: DC | PRN
  Administered 2016-02-13: 10 mg via ORAL
  Filled 2016-02-13: qty 1

## 2016-02-13 MED ORDER — OXYCODONE-ACETAMINOPHEN 5-325 MG PO TABS: 1.0000 | ORAL_TABLET | Freq: Four times a day (QID) | ORAL | Status: DC | PRN

## 2016-02-13 MED ORDER — ALUM & MAG HYDROXIDE-SIMETH 200-200-20 MG/5ML PO SUSP: 30.0000 mL | ORAL | Status: DC | PRN

## 2016-02-13 MED ORDER — NICOTINE 21 MG/24HR TD PT24
21.0000 mg | MEDICATED_PATCH | Freq: Every day | TRANSDERMAL | Status: DC
  Administered 2016-02-14: 21 mg via TRANSDERMAL
  Filled 2016-02-13: qty 1

## 2016-02-13 NOTE — Discharge Summary (Signed)
Brittany Cummings at [facility] NAME: Brittany Cummings    MR#:  [name]  DATE OF BIRTH:  [birth year]  DATE OF ADMISSION:  02/10/2016 ADMITTING PHYSICIAN: Brittany Mcardle, MD  DATE OF DISCHARGE: 02/13/2016  PRIMARY CARE PHYSICIAN: Brittany Aus, MD    ADMISSION DIAGNOSIS:  Altered mental status, unspecified altered mental status type [R41.82]  DISCHARGE DIAGNOSIS:  Principal Problem:   Severe recurrent major depression without psychotic features (Brittany Cummings) Active Problems:   Chronic pain   Long term current use of opiate analgesic   Acute hypercapnic respiratory failure (HCC)   Suicidal ideation   Intentional benzodiazepine overdose (Brittany Cummings)   SECONDARY DIAGNOSIS:   Past Medical History  Diagnosis Date  . Spondylolisthesis   . Diabetes mellitus without complication ([name])   . Hypertension   . Spinal headache     migraines  . Depression   . Seasonal allergies   . Diabetic neuropathy ([place])   . GERD (gastroesophageal reflux disease)   . Multiple gastric ulcers   . Arthritis   . Rib fracture     right side  . Cervical disc disease 02/08/2014  . Spondylolisthesis of lumbar region 09/15/2015    HOSPITAL COURSE:   1. Drug overdose, acute respiratory failure with hypoxia. Patient required intubation and extubation. Currently breathing comfortably on room air. Patient seen by psychiatry and they recommended inpatient follow-up on the psychiatric floor. Patient medically stable for transfer to the psychiatric floor. 2. Chronic pain. Will need follow-up with pain management as outpatient. Continue pain medications. 3. Type 2 diabetes mellitus. Patient takes NPH insulin 20 units twice a day. I will start 15 units twice a day and stop the lactated Ringer's. Hemoglobin A1c pending. 4. Diabetic neuropathy on gabapentin 5. Seasonal allergies on numerous nasal sprays 6. History of essential hypertension blood pressure okay off medications at this point we'll  continue to monitor  DISCHARGE CONDITIONS:   Satisfactory from a medical standpoint  CONSULTS OBTAINED:  Treatment Team:  Brittany Lex, MD  DRUG ALLERGIES:   Allergies  Allergen Reactions  . Meperidine Nausea And Vomiting    DISCHARGE MEDICATIONS:   Current Discharge Medication List    START taking these medications   Details  insulin NPH Human (HUMULIN N,NOVOLIN N) 100 UNIT/ML injection Inject 0.15 mLs (15 Units total) into the skin 2 (two) times daily at 8 am and 10 pm. Qty: 10 mL, Refills: 11    nicotine (NICODERM CQ - DOSED IN MG/24 HOURS) 21 mg/24hr patch Place 1 patch (21 mg total) onto the skin daily. Qty: 28 patch, Refills: 0      CONTINUE these medications which have NOT CHANGED   Details  azelastine (ASTELIN) 0.1 % nasal spray Place 1 spray into both nostrils 2 (two) times daily as needed for rhinitis.     Calcium Carb-Cholecalciferol (CALCIUM 600/VITAMIN D3) 600-800 MG-UNIT TABS Take 1 tablet by mouth daily.     cyclobenzaprine (FLEXERIL) 10 MG tablet Take 1 tablet (10 mg total) by mouth 3 (three) times daily as needed for muscle spasms. Qty: 30 tablet, Refills: 0    diclofenac sodium (VOLTAREN) 1 % GEL Apply 2 g topically 4 (four) times daily as needed (for pain). Pt applies to her back.   Associated Diagnoses: Cervical spondylosis    DULoxetine (CYMBALTA) 60 MG capsule Take 60 mg by mouth 2 (two) times daily. Refills: 2    estradiol-norethindrone (MIMVEY) 1-0.5 MG tablet Take 1 tablet by mouth daily.  fluticasone (FLONASE) 50 MCG/ACT nasal spray Place 2 sprays into both nostrils daily.    gabapentin (NEURONTIN) 300 MG capsule Take 300 mg by mouth at bedtime as needed (for pain).     HYDROcodone-acetaminophen (NORCO) 7.5-325 MG tablet Take 1 tablet by mouth 3 (three) times daily as needed for moderate pain.    Multiple Vitamin (MULTIVITAMIN WITH MINERALS) TABS tablet Take 1 tablet by mouth daily.    pantoprazole (PROTONIX) 40 MG tablet Take 40  mg by mouth daily.     QUEtiapine (SEROQUEL) 100 MG tablet Take 100 mg by mouth at bedtime.     sucralfate (CARAFATE) 1 G tablet Take 1 g by mouth 4 (four) times daily -  with meals and at bedtime.    trolamine salicylate (ASPERCREME) 10 % cream Apply 1 application topically as needed (for pain). Pt applies to her back.      STOP taking these medications     butorphanol (STADOL) 10 MG/ML nasal spray      metoprolol succinate (TOPROL-XL) 100 MG 24 hr tablet      Oxycodone HCl 10 MG TABS      PARoxetine (PAXIL) 20 MG tablet          DISCHARGE INSTRUCTIONS:   Follow-up with psychiatrist 1 day  If you experience worsening of your admission symptoms, develop shortness of breath, life threatening emergency, suicidal or homicidal thoughts you must seek medical attention immediately by calling 911 or calling your MD immediately  if symptoms less severe.  You Must read complete instructions/literature along with all the possible adverse reactions/side effects for all the Medicines you take and that have been prescribed to you. Take any new Medicines after you have completely understood and accept all the possible adverse reactions/side effects.   Please note  You were cared for by a hospitalist during your hospital stay. If you have any questions about your discharge medications or the care you received while you were in the hospital after you are discharged, you can call the unit and asked to speak with the hospitalist on call if the hospitalist that took care of you is not available. Once you are discharged, your primary care physician will handle any further medical issues. Please note that NO REFILLS for any discharge medications will be authorized once you are discharged, as it is imperative that you return to your primary care physician (or establish a relationship with a primary care physician if you do not have one) for your aftercare needs so that they can reassess your need for  medications and monitor your lab values.    Today   CHIEF COMPLAINT:   Chief Complaint  Patient presents with  . Altered Mental Status    HISTORY OF PRESENT ILLNESS:  Brittany Cummings  is a 50 y.o. female  presented with drug overdose and was intubated   VITAL SIGNS:  Blood pressure 133/79, pulse 69, temperature 97.6 F (36.4 C), temperature source Axillary, resp. rate 16, height 5\' 3"  (1.6 m), weight 82.2 kg (181 lb 3.5 oz), last menstrual period 11/13/2009, SpO2 98 %.    PHYSICAL EXAMINATION:  GENERAL:  50 y.o.-year-old patient lying in the bed with no acute distress.  EYES: Pupils equal, round, reactive to light and accommodation. No scleral icterus. Extraocular muscles intact.  HEENT: Head atraumatic, normocephalic. Oropharynx and nasopharynx clear.  NECK:  Supple, no jugular venous distention. No thyroid enlargement, no tenderness.  LUNGS: Normal breath sounds bilaterally, no wheezing, rales,rhonchi or crepitation. No use of accessory  muscles of respiration.  CARDIOVASCULAR: S1, S2 normal. No murmurs, rubs, or gallops.  ABDOMEN: Soft, non-tender, non-distended. Bowel sounds present. No organomegaly or mass.  EXTREMITIES: No pedal edema, cyanosis, or clubbing.  NEUROLOGIC: Cranial nerves II through XII are intact. Muscle strength 5/5 in all extremities. Sensation intact. Gait not checked.  PSYCHIATRIC: The patient is alert and oriented x 3.  SKIN: No obvious rash, lesion, or ulcer.   DATA REVIEW:   CBC  Recent Labs Lab 02/12/16 0447  WBC 10.0  HGB 12.6  HCT 37.5  PLT 165    Chemistries   Recent Labs Lab 02/10/16 2146  02/12/16 0447  NA 137  < > 140  K 3.7  < > 4.2  CL 107  < > 111  CO2 21*  < > 24  GLUCOSE 363*  < > 197*  BUN 13  < > 9  CREATININE 0.63  < > 0.59  CALCIUM 8.2*  < > 8.0*  MG  --   < > 2.2  AST 20  --   --   ALT 16  --   --   ALKPHOS 68  --   --   BILITOT 0.3  --   --   < > = values in this interval not displayed.  Cardiac  Enzymes  Recent Labs Lab 02/10/16 2146  TROPONINI <0.03    Microbiology Results  Results for orders placed or performed during the hospital encounter of 02/10/16  Urine culture     Status: None (Preliminary result)   Collection Time: 02/10/16  9:47 PM  Result Value Ref Range Status   Specimen Description URINE, CATHETERIZED  Final   Special Requests NONE  Final   Culture NO GROWTH 1 DAY  Final   Report Status PENDING  Incomplete  MRSA PCR Screening     Status: None   Collection Time: 02/11/16  2:45 AM  Result Value Ref Range Status   MRSA by PCR NEGATIVE NEGATIVE Final    Comment:        The GeneXpert MRSA Assay (FDA approved for NASAL specimens only), is one component of a comprehensive MRSA colonization surveillance program. It is not intended to diagnose MRSA infection nor to guide or monitor treatment for MRSA infections.   Culture, blood (routine x 2)     Status: None (Preliminary result)   Collection Time: 02/11/16  3:30 AM  Result Value Ref Range Status   Specimen Description BLOOD LEFT HAND  Final   Special Requests   Final    BOTTLES DRAWN AEROBIC AND ANAEROBIC [place]   Culture NO GROWTH 1 DAY  Final   Report Status PENDING  Incomplete  Culture, blood (routine x 2)     Status: None (Preliminary result)   Collection Time: 02/11/16  3:37 AM  Result Value Ref Range Status   Specimen Description BLOOD RIGHT HAND  Final   Special Requests BOTTLES DRAWN AEROBIC AND ANAEROBIC 6CCAERO,5CCANA  Final   Culture NO GROWTH 1 DAY  Final   Report Status PENDING  Incomplete    Management plans discussed with the patient, And she is in agreement although she would like to go home.  CODE STATUS:     Code Status Orders        Start     Ordered   02/10/16 2352  Full code   Continuous     02/11/16 0002    Code Status History    Date Active Date Inactive Code Status Order ID  Comments User Context   09/15/2015  3:37 PM 09/16/2015  3:47 PM Full Code [id number]   Karie Chimera, MD Inpatient      TOTAL TIME TAKING CARE OF THIS PATIENT: 32 minutes.    Loletha Grayer M.D on 02/13/2016 at 8:19 AM  Between 7am to 6pm - Pager - [phone]  After 6pm go to [email or web address] - password [email or web address]  [facility] Office  [phone]  CC: Primary care physician; Brittany Aus, MD

## 2016-02-13 NOTE — Progress Notes (Signed)
D:  Patient is a 50 year-old female admitted to [facility] ambulatory without difficulty.  Patient is alert and oriented upon admission. A:  Admission assessment completed without difficulty.  Skin and contraband assessment completed with no skin abnormalities nor contraband found.  Patient has mild bruising from medical hospitalization prior to admission.  Patient has a healed midline scar from back surgery.  Q.15 minute safety checks were implemented at the time of admission.  Patient was oriented to the unit and escorted to room #302. R:  Patient was receptive to and cooperative with admission assessment.  Patient contracts for safety on the unit at this time

## 2016-02-13 NOTE — Progress Notes (Signed)
Alert and oriented. Vital signs stable . No signs of acute distress. Discharge instructions given. Patient verbalizes understanding. Report given to [place] at [facility]. No other issues noted at this time.

## 2016-02-13 NOTE — Tx Team (Signed)
Initial Interdisciplinary Treatment Plan   PATIENT STRESSORS: Health problems Medication change or noncompliance   PATIENT STRENGTHS: Ability for insight Average or above average intelligence Communication skills General fund of knowledge Motivation for treatment/growth Supportive family/friends   PROBLEM LIST: Problem List/Patient Goals Date to be addressed Date deferred Reason deferred Estimated date of resolution  "see a positive future" 02/13/16     "be productive" 02/13/16     depression 02/13/16     Suicide attempt 02/13/16                                    DISCHARGE CRITERIA:  Improved stabilization in mood, thinking, and/or behavior Motivation to continue treatment in a less acute level of care Verbal commitment to aftercare and medication compliance  PRELIMINARY DISCHARGE PLAN: Outpatient therapy  PATIENT/FAMIILY INVOLVEMENT: This treatment plan has been presented to and reviewed with the patient, Brittany Cummings.  The patient and family have been given the opportunity to ask questions and make suggestions.  Dyann Kief 02/13/2016, 6:23 PM

## 2016-02-13 NOTE — Care Management Important Message (Signed)
Important Message  Patient Details  Name: RAIMEE ROETHLISBERGER MRN: [name] Date of Birth: 1965-12-01   Medicare Important Message Given:  Yes    Beverly Sessions, RN 02/13/2016, 10:41 AM

## 2016-02-14 DIAGNOSIS — F332 Major depressive disorder, recurrent severe without psychotic features: Principal | ICD-10-CM

## 2016-02-14 LAB — GLUCOSE, CAPILLARY
GLUCOSE-CAPILLARY: 370 mg/dL — AB (ref 65–99)
Glucose-Capillary: 143 mg/dL — ABNORMAL HIGH (ref 65–99)

## 2016-02-14 LAB — PROLACTIN: Prolactin: 8.9 ng/mL (ref 4.8–23.3)

## 2016-02-14 LAB — HEMOGLOBIN A1C: Hgb A1c MFr Bld: 8.9 % — ABNORMAL HIGH (ref 4.0–6.0)

## 2016-02-14 NOTE — [facility] Suicide Risk Assessment (Addendum)
[facility] Admission Suicide Risk Assessment   Nursing information obtained from:    Demographic factors:    Current Mental Status:    Loss Factors:    Historical Factors:    Risk Reduction Factors:     Total Time spent with patient: 1 hour Principal Problem: Severe recurrent major depression without psychotic features [facility]) Diagnosis:   Patient Active Problem List   Diagnosis Date Noted  . Severe recurrent major depression without psychotic features ([place]) [F33.2] 02/12/2016  . Suicidal ideation [R45.851] 02/12/2016  . Intentional benzodiazepine overdose ([place]) [T42.4X2A] 02/12/2016  . Lumbar foraminal stenosis (Severe) (L4-5) (Left) [M48.06] 01/11/2016  .  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 Cervical herniated disc (C5-6 and C6-7) [M50.20] 01/11/2016  . Thoracic disc herniation (T1-2) (Left) [M51.24] 01/11/2016  . Pain management [Z51.89] 01/11/2016  . Adenomatous polyp [D36.9] 12/13/2015  . Glaucoma [H40.9] 12/13/2015  . Headache, migraine [G43.909] 12/13/2015  . Chronic pain [G89.29] 12/13/2015  . Chronic pain syndrome [G89.4] 12/13/2015  . Long term prescription opiate use [Z79.899] 12/13/2015  . Chronic abdominal pain (epigastric) [R10.13, G89.29] 12/13/2015  . Cervical spondylosis [M47.812] 12/13/2015  . Lumbar spondylosis [M47.816] 12/13/2015  . 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 Lumbar facet hypertrophy (Severe at L4-5) [id number] 12/13/2015  . Chronic neck pain (Bilateral) (L>R) [M54.2, G89.29] 12/13/2015  . Cervicogenic headache (Left) [R51] 12/13/2015  . Occipital neuralgia (Left) [M54.81] 12/13/2015  . Chronic shoulder pain (Bilateral) (R>L) [M25.512, G89.29, M25.511] 12/13/2015  . Chronic cervical radicular pain (Bilateral) (L>R) [M54.12, G89.29] 12/13/2015  . Chronic low  back pain (Location of Primary Source of Pain) (Bilateral) (R>L) [M54.5, G89.29] 12/13/2015  . Lumbar facet syndrome (Location of Primary Source of Pain) (Bilateral) (R>L) [M54.5] 12/13/2015  . Chronic knee pain (Location of Secondary source of pain) (Bilateral) (R>L) [M25.561, M25.562, G89.29] 12/13/2015  . Chronic lumbar radicular pain (Left) [M54.16, G89.29] 12/13/2015  . Lumbosacral radiculopathy at L4 (Left) [M54.16] 12/13/2015  . Chronic lower extremity pain (Left) [M79.606, G89.29] 12/13/2015  . Posture arthritis of knee (Location of Secondary source of pain) (Bilateral) (R>L) [M17.0] 12/13/2015  . Chronic foot pain (Bilateral) (L>R) [id number], G89.29] 12/13/2015  . Chronic hip pain (Bilateral) (R>L) [M25.559, G89.29] 12/13/2015  . Acne inversa [L73.2] 10/18/2014  . Type 1 diabetes mellitus ([place]) [E10.9] 02/08/2014  . Tobacco use disorder [F17.200] 02/08/2014   Subjective Data: Depression, chronic pain.  Continued Clinical Symptoms:  Alcohol Use Disorder Identification Test Final Score (AUDIT): 0 The "Alcohol Use Disorders Identification Test", Guidelines for Use in Primary Care, Second Edition.  World Pharmacologist [facility]). Score between 0-7:  no or low risk or alcohol related problems. Score between 8-15:  moderate risk of alcohol related problems. Score between 16-19:  high risk of alcohol related problems. Score 20 or above:  warrants further diagnostic evaluation for alcohol dependence and treatment.   CLINICAL FACTORS:   Depression:   Severe Chronic Pain   Musculoskeletal: Strength & Muscle Tone: within normal limits Gait & Station: normal Patient leans: N/A  Psychiatric Specialty Exam: Review of Systems  Musculoskeletal: Positive for back pain.  All other systems reviewed and are negative.   Blood pressure 143/80, pulse 66, temperature 98.2 F (36.8 C), temperature source Oral, resp. rate 18, height 5\' 3"  (1.6 m), weight 79.833 kg (176 lb), last menstrual  period 11/13/2009, SpO2 100 %.  Body mass index is 31.18 kg/(m^2).  General Appearance: Casual  Eye Contact::  Good  Speech:  Clear and Coherent  Volume:  Normal  Mood:  Euthymic  Affect:  Appropriate  Thought Process:  Goal Directed  Orientation:  Full (Time, Place, and Person)  Thought Content:  WDL  Suicidal Thoughts:  No  Homicidal Thoughts:  No  Memory:  Immediate;   Fair Recent;   Fair Remote;   Fair  Judgement:  Fair  Insight:  Fair  Psychomotor Activity:  Normal  Concentration:  Fair  Recall:  [employer] of [place]  Language: Fair  Akathisia:  No  Handed:  Right  AIMS (if indicated):     Assets:  Communication Skills Desire for Improvement Financial Resources/Insurance Housing Intimacy Resilience Social Support Transportation Vocational/Educational  Sleep:  Number of Hours: 6.75  Cognition: WNL  ADL's:  Intact    COGNITIVE FEATURES THAT CONTRIBUTE TO RISK:  None    SUICIDE RISK:   Minimal: No identifiable suicidal ideation.  Patients presenting with no risk factors but with morbid ruminations; may be classified as minimal risk based on the severity of the depressive symptoms  PLAN OF CARE: Hospital admission, medication management, discharge planning.  Ms. Materne is a 50 year old female with a history of depression and chronic pain admitted after suicide attempt by medication overdose.  1. Suicidal ideation. The patient adamantly denies any intention or thoughts or plans to hurt herself or others. She is able to contract for safety. She is forward thinking and optimistic about the future.  2. Mood. We continued Cymbalta and Seroquel for depression.  3. Chronic pain. We will continue Flexeril and narcotic pain he killers. No prescriptions were given.   4. Diabetes. We continued insulin, ADA diet, sliding scale insulin and blood glucose monitoring.   5. GERD. Continue Protonix.  6. Smoking. Nicotine patch was available.  7. Metabolic syndrome  monitoring. Lipid profile and TSH are normal. Hemoglobin A1c is 8.9, prolactin is also 8.9.   8. Disposition. The patient will be discharged to home with family. She will follow up with pain clinic for pain management. She will need a psychiatrist.  I certify that inpatient services furnished can reasonably be expected to improve the patient's condition.   Orson Slick, MD 02/14/2016, 12:58 PM

## 2016-02-14 NOTE — H&P (Signed)
Psychiatric Admission Assessment Adult  Patient Identification: Brittany Cummings MRN:  [id number] Date of Evaluation:  02/14/2016 Chief Complaint:  Depression  Principal Diagnosis: Severe recurrent major depression without psychotic features [facility]) Diagnosis:   Patient Active Problem List   Diagnosis Date Noted  . Severe recurrent major depression without psychotic features ([place]) [F33.2] 02/12/2016  . Suicidal ideation [R45.851] 02/12/2016  . Intentional benzodiazepine overdose ([place]) [T42.4X2A] 02/12/2016  . Lumbar foraminal stenosis (Severe) (L4-5) (Left) [M48.06] 01/11/2016  .  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 Cervical herniated disc (C5-6 and C6-7) [M50.20] 01/11/2016  . Thoracic disc herniation (T1-2) (Left) [M51.24] 01/11/2016  . Pain management [Z51.89] 01/11/2016  . Adenomatous polyp [D36.9] 12/13/2015  . Glaucoma [H40.9] 12/13/2015  . Headache, migraine [G43.909] 12/13/2015  . Chronic pain [G89.29] 12/13/2015  . Chronic pain syndrome [G89.4] 12/13/2015  . Long term prescription opiate use [Z79.899] 12/13/2015  . Chronic abdominal pain (epigastric) [R10.13, G89.29] 12/13/2015  . Cervical spondylosis [M47.812] 12/13/2015  . Lumbar spondylosis [M47.816] 12/13/2015  . 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 Lumbar facet hypertrophy (Severe at L4-5) [id number] 12/13/2015  . Chronic neck pain (Bilateral) (L>R) [M54.2, G89.29] 12/13/2015  . Cervicogenic headache (Left) [R51] 12/13/2015  . Occipital neuralgia (Left) [M54.81] 12/13/2015  . Chronic shoulder pain (Bilateral) (R>L) [M25.512, G89.29, M25.511] 12/13/2015  . Chronic cervical radicular pain (Bilateral) (L>R) [M54.12, G89.29] 12/13/2015  . Chronic low back pain (Location of Primary Source of Pain) (Bilateral) (R>L) [M54.5, G89.29]  12/13/2015  . Lumbar facet syndrome (Location of Primary Source of Pain) (Bilateral) (R>L) [M54.5] 12/13/2015  . Chronic knee pain (Location of Secondary source of pain) (Bilateral) (R>L) [M25.561, M25.562, G89.29] 12/13/2015  . Chronic lumbar radicular pain (Left) [M54.16, G89.29] 12/13/2015  . Lumbosacral radiculopathy at L4 (Left) [M54.16] 12/13/2015  . Chronic lower extremity pain (Left) [M79.606, G89.29] 12/13/2015  . Posture arthritis of knee (Location of Secondary source of pain) (Bilateral) (R>L) [M17.0] 12/13/2015  . Chronic foot pain (Bilateral) (L>R) [id number], G89.29] 12/13/2015  . Chronic hip pain (Bilateral) (R>L) [M25.559, G89.29] 12/13/2015  . Acne inversa [L73.2] 10/18/2014  . Type 1 diabetes mellitus ([place]) [E10.9] 02/08/2014  . Tobacco use disorder [F17.200] 02/08/2014   History of Present Illness:  Identifying data. [name] is a 50 year old female with a history of depression and chronic pain.  Chief complaint. "I feel so ashamed."  History of present illness. Information was obtained from the patient and the chart. The patient has a long history of depression that has been managed well with a combination of Cymbalta and Seroquel. The patient also suffers chronic pain. Her pain medication has been prescribed by her primary provider who recently referred her to our pain clinic. At the patient was given injections at the pain clinic but she found that unhelpful. During her last visit that the patient understood that no pain medication will be prescribed. She reports that she was in excruciating pain as injections were not working. She decided that she cannot go on and overdose on medications. Her husband found her and brought her to the hospital. She was confused and aggressive on admission. She was admitted to CCU and was intubated. She was seen on the medical floor by Dr. Weber Cooks, our psychiatric consultant, and was transferred to psychiatry for further treatment. The patient  denies any symptoms of depression as  they were well controlled with her current regimen. She no longer feels suicidal. When she woke up on the medical floor and so her husband so taken and suffering she decided that she could never attempted suicide again. She decided that she will try another pain clinic to improve the quality of life. She spoke with her husband at length and she realizes how selfish her act has been. She is very remorseful. She denies symptoms of anxiety, psychosis, or symptoms suggestive of bipolar mania. She denies alcohol, illicit drugs, or prescription pill abuse.  Past psychiatric history. There was one suicide attempt over 20 years ago before she was diagnosed with depression and treated. She was treated in the past with Wellbutrin that she did not like. Cymbalta and Paxil worked better for her. Addition of Seroquel made enormous difference in controlling her depressive symptoms.  Family psychiatric history. Father with depression.  Social history. She is a retired [employer] scientist. She lives with her husband of 49. She she suffers chronic back and neck pain that has not been adequately addressed.  Total Time spent with patient: 1 hour  Past Psychiatric History: Depression,.  Is the patient at risk to self? No.  Has the patient been a risk to self in the past 6 months? No.  Has the patient been a risk to self within the distant past? Yes.    Is the patient a risk to others? No.  Has the patient been a risk to others in the past 6 months? No.  Has the patient been a risk to others within the distant past? No.   Prior Inpatient Therapy:   Prior Outpatient Therapy:    Alcohol Screening: 1. How often do you have a drink containing alcohol?: Never 2. How many drinks containing alcohol do you have on a typical day when you are drinking?: 1 or 2 3. How often do you have six or more drinks on one occasion?: Never Preliminary Score: 0 4. How often during the last year have you found  that you were not able to stop drinking once you had started?: Never 5. How often during the last year have you failed to do what was normally expected from you becasue of drinking?: Never 6. How often during the last year have you needed a first drink in the morning to get yourself going after a heavy drinking session?: Never 7. How often during the last year have you had a feeling of guilt of remorse after drinking?: Never 8. How often during the last year have you been unable to remember what happened the night before because you had been drinking?: Never 9. Have you or someone else been injured as a result of your drinking?: No 10. Has a relative or friend or a doctor or another health worker been concerned about your drinking or suggested you cut down?: No Alcohol Use Disorder Identification Test Final Score (AUDIT): 0 Brief Intervention: AUDIT score less than 7 or less-screening does not suggest unhealthy drinking-brief intervention not indicated Substance Abuse History in the last 12 months:  No. Consequences of Substance Abuse: NA Previous Psychotropic Medications: Yes  Psychological Evaluations: No  Past Medical History:  Past Medical History  Diagnosis Date  . Spondylolisthesis   . Diabetes mellitus without complication ([place])   . Hypertension   . Spinal headache     migraines  . Depression   . Seasonal allergies   . Diabetic neuropathy ([place])   . GERD (gastroesophageal reflux disease)   . Multiple  gastric ulcers   . Arthritis   . Rib fracture     right side  . Cervical disc disease 02/08/2014  . Spondylolisthesis of lumbar region 09/15/2015    Past Surgical History  Procedure Laterality Date  . Gastric bypass    . Back surgery    . Breast reduction surgery    . Appendectomy    . Carpal tunnel release      right   . Joint replacement      left knee  . Knee arthroscopy      Left  . Cataract extraction      right  . Abdominal abscess excision    . Diagnostic  laparoscopy      exploratory lap and LOA  . Cholecystectomy    . Hernia repair    . Colonoscopy w/ biopsies and polypectomy    . Wisdom tooth extraction    . Spinal fusion  09-15-2015    L4-5   Family History:  Family History  Problem Relation Age of Onset  . Cancer Mother   . Cancer - Colon Father   . Diabetes Other   . Stroke Other   . Hypertension Other    Family Psychiatric  History: Depression. Tobacco Screening: @FLOW ([phone])::1)@ Social History:  History  Alcohol Use No     History  Drug Use No    Additional Social History:                           Allergies:   Allergies  Allergen Reactions  . Meperidine Nausea And Vomiting   Lab Results:  Results for orders placed or performed during the hospital encounter of 02/13/16 (from the past 48 hour(s))  Hemoglobin A1c     Status: Abnormal   Collection Time: 02/13/16  4:42 PM  Result Value Ref Range   Hgb A1c MFr Bld 8.9 (H) 4.0 - 6.0 %  Lipid panel, fasting     Status: Abnormal   Collection Time: 02/13/16  4:42 PM  Result Value Ref Range   Cholesterol 141 0 - 200 mg/dL   Triglycerides 220 (H) <150 mg/dL   HDL 40 (L) >40 mg/dL   Total CHOL/HDL Ratio 3.5 RATIO   VLDL 44 (H) 0 - 40 mg/dL   LDL Cholesterol 57 0 - 99 mg/dL    Comment:        Total Cholesterol/HDL:CHD Risk Coronary Heart Disease Risk Table                     Men   Women  1/2 Average Risk   3.4   3.3  Average Risk       5.0   4.4  2 X Average Risk   9.6   7.1  3 X Average Risk  23.4   11.0        Use the calculated Patient Ratio above and the CHD Risk Table to determine the patient's CHD Risk.        ATP III CLASSIFICATION (LDL):  <100     mg/dL   Optimal  100-129  mg/dL   Near or Above                    Optimal  130-159  mg/dL   Borderline  160-189  mg/dL   High  >190     mg/dL   Very High   Prolactin     Status: None  Collection Time: 02/13/16  4:42 PM  Result Value Ref Range   Prolactin 8.9 4.8 - 23.3 ng/mL     Comment: (NOTE) Performed At: [facility] [place], [place] [id number] Lindon Romp MD [id number]   TSH     Status: None   Collection Time: 02/13/16  4:42 PM  Result Value Ref Range   TSH 1.044 0.350 - 4.500 uIU/mL  Glucose, capillary     Status: Abnormal   Collection Time: 02/13/16  5:27 PM  Result Value Ref Range   Glucose-Capillary 164 (H) 65 - 99 mg/dL  Glucose, capillary     Status: Abnormal   Collection Time: 02/13/16  8:28 PM  Result Value Ref Range   Glucose-Capillary 177 (H) 65 - 99 mg/dL   Comment 1 Notify RN   Glucose, capillary     Status: Abnormal   Collection Time: 02/14/16  6:41 AM  Result Value Ref Range   Glucose-Capillary 143 (H) 65 - 99 mg/dL  Glucose, capillary     Status: Abnormal   Collection Time: 02/14/16 12:15 PM  Result Value Ref Range   Glucose-Capillary 370 (H) 65 - 99 mg/dL   Comment 1 Document in Chart     Blood Alcohol level:  Lab Results  Component Value Date   ETH <5 [id number]    Metabolic Disorder Labs:  Lab Results  Component Value Date   HGBA1C 8.9* 02/13/2016   MPG 157 08/24/2015   Lab Results  Component Value Date   PROLACTIN 8.9 02/13/2016   Lab Results  Component Value Date   CHOL 141 02/13/2016   TRIG 220* 02/13/2016   HDL 40* 02/13/2016   CHOLHDL 3.5 02/13/2016   VLDL 44* 02/13/2016   LDLCALC 57 02/13/2016   LDLCALC SEE COMMENT 06/18/2012    Current Medications: Current Facility-Administered Medications  Medication Dose Route Frequency Provider Last Rate Last Dose  . acetaminophen (TYLENOL) tablet 650 mg  650 mg Oral Q6H PRN Gonzella Lex, MD      . albuterol (PROVENTIL HFA;VENTOLIN HFA) 108 (90 Base) MCG/ACT inhaler 2 puff  2 puff Inhalation Q4H PRN Lenis Noon, RPH      . alum & mag hydroxide-simeth (MAALOX/MYLANTA) 200-200-20 MG/5ML suspension 30 mL  30 mL Oral Q4H PRN Gonzella Lex, MD      . cyclobenzaprine (FLEXERIL) tablet 10 mg  10 mg Oral TID PRN Gonzella Lex, MD   10  mg at 02/13/16 1842  . DULoxetine (CYMBALTA) DR capsule 60 mg  60 mg Oral BID Gonzella Lex, MD   60 mg at 02/14/16 0919  . estradiol-norethindrone (ACTIVELLA) 1-0.5 MG per tablet 1 tablet  1 tablet Oral Daily Gonzella Lex, MD   1 tablet at 02/14/16 [phone]  . gabapentin (NEURONTIN) capsule 300 mg  300 mg Oral QHS PRN Gonzella Lex, MD      . HYDROcodone-acetaminophen [facility]) 10-325 MG per tablet 1 tablet  1 tablet Oral Q12H Gonzella Lex, MD   1 tablet at 02/14/16 0919  . insulin aspart (novoLOG) injection 0-15 Units  0-15 Units Subcutaneous TID WC Gonzella Lex, MD   15 Units at 02/14/16 1218  . insulin aspart (novoLOG) injection 0-5 Units  0-5 Units Subcutaneous QHS Gonzella Lex, MD   0 Units at 02/13/16 2141  . insulin detemir (LEVEMIR) injection 15 Units  15 Units Subcutaneous BID Gonzella Lex, MD   15 Units at 02/14/16 [phone]  . magnesium hydroxide (MILK OF  MAGNESIA) suspension 30 mL  30 mL Oral Daily PRN Gonzella Lex, MD      . nicotine (NICODERM CQ - dosed in mg/24 hours) patch 21 mg  21 mg Transdermal Daily Gonzella Lex, MD   21 mg at 02/14/16 0920  . oxyCODONE-acetaminophen (PERCOCET/ROXICET) 5-325 MG per tablet 1 tablet  1 tablet Oral Q6H PRN Gonzella Lex, MD      . pantoprazole (PROTONIX) EC tablet 40 mg  40 mg Oral Daily Gonzella Lex, MD   40 mg at 02/14/16 0919  . QUEtiapine (SEROQUEL) tablet 100 mg  100 mg Oral QHS Gonzella Lex, MD   100 mg at 02/13/16 2141   PTA Medications: Prescriptions prior to admission  Medication Sig Dispense Refill Last Dose  . azelastine (ASTELIN) 0.1 % nasal spray Place 1 spray into both nostrils 2 (two) times daily as needed for rhinitis.    Past Week at Unknown time  . Calcium Carb-Cholecalciferol (CALCIUM 600/VITAMIN D3) 600-800 MG-UNIT TABS Take 1 tablet by mouth daily.    02/10/2016 at Unknown time  . cyclobenzaprine (FLEXERIL) 10 MG tablet Take 1 tablet (10 mg total) by mouth 3 (three) times daily as needed for muscle spasms. 30 tablet  0 02/09/2016 at Unknown time  . diclofenac sodium (VOLTAREN) 1 % GEL Apply 2 g topically 4 (four) times daily as needed (for pain). Pt applies to her back.   02/09/2016 at Unknown time  . DULoxetine (CYMBALTA) 60 MG capsule Take 60 mg by mouth 2 (two) times daily.  2 02/10/2016 at Unknown time  . estradiol-norethindrone (MIMVEY) 1-0.5 MG tablet Take 1 tablet by mouth daily.    02/10/2016 at Unknown time  . fluticasone (FLONASE) 50 MCG/ACT nasal spray Place 2 sprays into both nostrils daily.   02/10/2016 at Unknown time  . gabapentin (NEURONTIN) 300 MG capsule Take 300 mg by mouth at bedtime as needed (for pain).    Past Week at Unknown time  . HYDROcodone-acetaminophen (NORCO) 7.5-325 MG tablet Take 1 tablet by mouth 3 (three) times daily as needed for moderate pain.   02/09/2016 at 1200  . insulin NPH Human (HUMULIN N,NOVOLIN N) 100 UNIT/ML injection Inject 0.15 mLs (15 Units total) into the skin 2 (two) times daily at 8 am and 10 pm. 10 mL 11   . Multiple Vitamin (MULTIVITAMIN WITH MINERALS) TABS tablet Take 1 tablet by mouth daily.   02/10/2016 at Unknown time  . nicotine (NICODERM CQ - DOSED IN MG/24 HOURS) 21 mg/24hr patch Place 1 patch (21 mg total) onto the skin daily. 28 patch 0   . pantoprazole (PROTONIX) 40 MG tablet Take 40 mg by mouth daily.    02/10/2016 at Unknown time  . QUEtiapine (SEROQUEL) 100 MG tablet Take 100 mg by mouth at bedtime.    02/09/2016 at Unknown time  . sucralfate (CARAFATE) 1 G tablet Take 1 g by mouth 4 (four) times daily -  with meals and at bedtime.   02/10/2016 at Unknown time  . trolamine salicylate (ASPERCREME) 10 % cream Apply 1 application topically as needed (for pain). Pt applies to her back.   Past Week at Unknown time    Musculoskeletal: Strength & Muscle Tone: within normal limits Gait & Station: normal Patient leans: N/A  Psychiatric Specialty Exam: I reviewed physical exam performed on medical floor and agree with her findings. Physical Exam  Nursing note  and vitals reviewed.   Review of Systems  Musculoskeletal: Positive for back pain.  Blood pressure 143/80, pulse 66, temperature 98.2 F (36.8 C), temperature source Oral, resp. rate 18, height 5\' 3"  (1.6 m), weight 79.833 kg (176 lb), last menstrual period 11/13/2009, SpO2 100 %.Body mass index is 31.18 kg/(m^2).  See SRA.                                                  Sleep:  Number of Hours: 6.75     Treatment Plan Summary: Daily contact with patient to assess and evaluate symptoms and progress in treatment and Medication management   Ms. Fells is a 50 year old female with a history of depression and chronic pain admitted after suicide attempt by medication overdose.  1. Suicidal ideation. The patient adamantly denies any intention or thoughts or plans to hurt herself or others. She is able to contract for safety. She is forward thinking and optimistic about the future.  2. Mood. We continued Cymbalta and Seroquel for depression.  3. Chronic pain. We will continue Flexeril and narcotic pain he killers. No prescriptions were given.   4. Diabetes. We continued insulin, ADA diet, sliding scale insulin and blood glucose monitoring.   5. GERD. Continue Protonix.  6. Smoking. Nicotine patch was available.  7. Metabolic syndrome monitoring. Lipid profile and TSH are normal. Hemoglobin A1c is 8.9, prolactin is also 8.9.   8. Disposition. The patient will be discharged to home with family. She will follow up with pain clinic for pain management. She will need a psychiatrist.   Observation Level/Precautions:  15 minute checks  Laboratory:  CBC Chemistry Profile HbAIC UDS UA  Psychotherapy:    Medications:    Consultations:    Discharge Concerns:    Estimated LOS:  Other:     I certify that inpatient services furnished can reasonably be expected to improve the patient's condition.    Orson Slick, MD 5/2/20172:16 PM

## 2016-02-14 NOTE — Progress Notes (Addendum)
D:Patient aware of discharge this shift . Patient returning home . Patient received all belonging locked up . Patient denies  Suicidal  And homicidal ideations  .  A: Writer instructed on discharge criteria  .No prescriptions  given to patient . Aware  Of follow up appointment . Patient received AVS, Tranisitional Record , And Suicidal Risk Assesment . R: Patient left unit with no questions  Or concerns  With husband

## 2016-02-14 NOTE — [facility] Suicide Risk Assessment (Signed)
[facility] INPATIENT:  Family/Significant Other Suicide Prevention Education  Suicide Prevention Education:  Education Completed; Chrishana Zwieg (husband) [phone] has been identified by the patient as the family member/significant other with whom the patient will be residing, and identified as the person(s) who will aid the patient in the event of a mental health crisis (suicidal ideations/suicide attempt).  With written consent from the patient, the family member/significant other has been provided the following suicide prevention education, prior to the and/or following the discharge of the patient.  The suicide prevention education provided includes the following:  Suicide risk factors  Suicide prevention and interventions  National Suicide Hotline telephone number  [facility] assessment telephone number  [facility] Emergency Assistance [place] and/or Residential Mobile Crisis Unit telephone number  Request made of family/significant other to:  Remove weapons (e.g., guns, rifles, knives), all items previously/currently identified as safety concern.    Remove drugs/medications (over-the-counter, prescriptions, illicit drugs), all items previously/currently identified as a safety concern.  The family member/significant other verbalizes understanding of the suicide prevention education information provided.  The family member/significant other agrees to remove the items of safety concern listed above.  Keene Breath, MSW, LCSW 02/14/2016, 4:15 PM

## 2016-02-14 NOTE — Progress Notes (Addendum)
Pt was admitted and discharged within a 24 hour period so no assessment will need to be completed for treatment by the CSW.  Alphonse Guild. Ardena Gangl, LCSWA, LCAS  02/14/16

## 2016-02-14 NOTE — [facility] Group Notes (Signed)
[place] Group Notes:  (Nursing/MHT/Case Management/Adjunct)  Date:  02/14/2016  Time:  2:09 PM  Type of Therapy:  Psychoeducational Skills  Participation Level:  Active  Participation Quality:  Appropriate, Attentive and Supportive  Affect:  Appropriate  Cognitive:  Appropriate  Insight:  Appropriate  Engagement in Group:  Supportive  Modes of Intervention:  Discussion and Education  Summary of Progress/Problems:  Brittany Cummings 02/14/2016, 2:09 PM

## 2016-02-14 NOTE — [facility] Suicide Risk Assessment (Signed)
[facility] Discharge Suicide Risk Assessment   Principal Problem: Severe recurrent major depression without psychotic features [facility]) Discharge Diagnoses:  Patient Active Problem List   Diagnosis Date Noted  . Severe recurrent major depression without psychotic features ([place]) [F33.2] 02/12/2016  . Suicidal ideation [R45.851] 02/12/2016  . Intentional benzodiazepine overdose ([place]) [T42.4X2A] 02/12/2016  . Lumbar foraminal stenosis (Severe) (L4-5) (Left) [M48.06] 01/11/2016  .  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 Cervical herniated disc (C5-6 and C6-7) [M50.20] 01/11/2016  . Thoracic disc herniation (T1-2) (Left) [M51.24] 01/11/2016  . Pain management [Z51.89] 01/11/2016  . Adenomatous polyp [D36.9] 12/13/2015  . Glaucoma [H40.9] 12/13/2015  . Headache, migraine [G43.909] 12/13/2015  . Chronic pain [G89.29] 12/13/2015  . Chronic pain syndrome [G89.4] 12/13/2015  . Long term prescription opiate use [Z79.899] 12/13/2015  . Chronic abdominal pain (epigastric) [R10.13, G89.29] 12/13/2015  . Cervical spondylosis [M47.812] 12/13/2015  . Lumbar spondylosis [M47.816] 12/13/2015  . 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 Lumbar facet hypertrophy (Severe at L4-5) [id number] 12/13/2015  . Chronic neck pain (Bilateral) (L>R) [M54.2, G89.29] 12/13/2015  . Cervicogenic headache (Left) [R51] 12/13/2015  . Occipital neuralgia (Left) [M54.81] 12/13/2015  . Chronic shoulder pain (Bilateral) (R>L) [M25.512, G89.29, M25.511] 12/13/2015  . Chronic cervical radicular pain (Bilateral) (L>R) [M54.12, G89.29] 12/13/2015  . Chronic low back pain (Location of Primary Source of Pain) (Bilateral) (R>L) [M54.5, G89.29] 12/13/2015  . Lumbar facet syndrome (Location of Primary Source of Pain) (Bilateral) (R>L) [M54.5] 12/13/2015   . Chronic knee pain (Location of Secondary source of pain) (Bilateral) (R>L) [M25.561, M25.562, G89.29] 12/13/2015  . Chronic lumbar radicular pain (Left) [M54.16, G89.29] 12/13/2015  . Lumbosacral radiculopathy at L4 (Left) [M54.16] 12/13/2015  . Chronic lower extremity pain (Left) [M79.606, G89.29] 12/13/2015  . Posture arthritis of knee (Location of Secondary source of pain) (Bilateral) (R>L) [M17.0] 12/13/2015  . Chronic foot pain (Bilateral) (L>R) [id number], G89.29] 12/13/2015  . Chronic hip pain (Bilateral) (R>L) [M25.559, G89.29] 12/13/2015  . Acne inversa [L73.2] 10/18/2014  . Type 1 diabetes mellitus ([place]) [E10.9] 02/08/2014  . Tobacco use disorder [F17.200] 02/08/2014    Total Time spent with patient: 1 hour  Musculoskeletal: Strength & Muscle Tone: within normal limits Gait & Station: normal Patient leans: N/A  Psychiatric Specialty Exam: Review of Systems  Musculoskeletal: Positive for back pain.  All other systems reviewed and are negative.   Blood pressure 143/80, pulse 66, temperature 98.2 F (36.8 C), temperature source Oral, resp. rate 18, height 5\' 3"  (1.6 m), weight 79.833 kg (176 lb), last menstrual period 11/13/2009, SpO2 100 %.Body mass index is 31.18 kg/(m^2).  See SRA.                                                     Mental Status Per Nursing Assessment::   On Admission:     Demographic Factors:  Caucasian and Unemployed  Loss Factors: Decline in physical health  Historical Factors: Prior suicide attempts, Family history of mental illness or substance abuse and Impulsivity  Risk Reduction Factors:   Sense of responsibility to family, Living with another person, especially a relative, Positive social support and Positive therapeutic relationship  Continued Clinical Symptoms:  Depression:   Severe Chronic Pain  Cognitive Features That Contribute To Risk:  None    Suicide Risk:  Minimal: No identifiable suicidal  ideation.  Patients presenting with no risk factors but with morbid ruminations; may be classified as minimal risk based on the severity of the depressive symptoms    Plan Of Care/Follow-up recommendations:  Activity:  As tolerated. Diet:  Low sodium heart healthy. Other:  Keep follow-up appointments.  Orson Slick, MD 02/14/2016, 1:00 PM

## 2016-02-14 NOTE — Progress Notes (Signed)
  [facility] Adult Case Management Discharge Plan :  Will you be returning to the same living situation after discharge:  Yes,  home with husband At discharge, do you have transportation home?: Yes,  patient's husband will pick up Do you have the ability to pay for your medications: Yes,  Patient has Medicare  Release of information consent forms completed and in the chart;  Patient's signature needed at discharge.  Patient to Follow up at: Follow-up Information    Follow up with [facility].   Why:  Please arrive to the walk-in clinic between the hours of 8am-2:30pm for an assessment for medication management and therapy.  Arrive as early as possible for prompt service.  Please call Sherrian Divers at [phone] for questions and assistance.   Contact information:   [place] [address] Ph: [phone] Fax: [phone]      Next level of care provider has access to [facility] and Suicide Prevention discussed: Yes,  SPE discussed with patient and Nixie Jokinen (husband) [phone]  Have you used any form of tobacco in the last 30 days? (Cigarettes, Smokeless Tobacco, Cigars, and/or Pipes): Yes  Has patient been referred to the Quitline?: Patient refused referral  Patient has been referred for addiction treatment: N/A  Keene Breath, MSW, LCSW 02/14/2016, 4:16 PM [phone]

## 2016-02-14 NOTE — Discharge Summary (Signed)
Physician Discharge Summary Note  Patient:  Brittany Cummings is an 50 y.o., female MRN:  [id number] DOB:  05-11-1966 Patient phone:  [phone] (home)  Patient address:   [place] [address],  Total Time spent with patient: 1 hour  Date of Admission:  02/13/2016 Date of Discharge: 02/14/2016  Reason for Admission:  Suicidal attempt.  Identifying data. Brittany Cummings is a 50 year old female with a history of depression and chronic pain.  Chief complaint. "I feel so ashamed."  History of present illness. Information was obtained from the patient and the chart. The patient has a long history of depression that has been managed well with a combination of Cymbalta and Seroquel. The patient also suffers chronic pain. Her pain medication has been prescribed by her primary provider who recently referred her to our pain clinic. At the patient was given injections at the pain clinic but she found that unhelpful. During her last visit that the patient understood that no pain medication will be prescribed. She reports that she was in excruciating pain as injections were not working. She decided that she cannot go on and overdose on medications. Her husband found her and brought her to the hospital. She was confused and aggressive on admission. She was admitted to CCU and was intubated. She was seen on the medical floor by Dr. Weber Cooks, our psychiatric consultant, and was transferred to psychiatry for further treatment. The patient denies any symptoms of depression as they were well controlled with her current regimen. She no longer feels suicidal. When she woke up on the medical floor and so her husband so taken and suffering she decided that she could never attempted suicide again. She decided that she will try another pain clinic to improve the quality of life. She spoke with her husband at length and she realizes how selfish her act has been. She is very remorseful. She denies symptoms of anxiety,  psychosis, or symptoms suggestive of bipolar mania. She denies alcohol, illicit drugs, or prescription pill abuse.  Past psychiatric history. There was one suicide attempt over 20 years ago before she was diagnosed with depression and treated. She was treated in the past with Wellbutrin that she did not like. Cymbalta and Paxil worked better for her. Addition of Seroquel made enormous difference in controlling her depressive symptoms.  Family psychiatric history. Father with depression.  Social history. She is a retired [employer] scientist. She lives with her husband of 21. She she suffers chronic back and neck pain that has not been adequately addressed.  Principal Problem: Severe recurrent major depression without psychotic features [facility]) Discharge Diagnoses: Patient Active Problem List   Diagnosis Date Noted  . Severe recurrent major depression without psychotic features ([place]) [F33.2] 02/12/2016  . Suicidal ideation [R45.851] 02/12/2016  . Intentional benzodiazepine overdose ([place]) [T42.4X2A] 02/12/2016  . Lumbar foraminal stenosis (Severe) (L4-5) (Left) [M48.06] 01/11/2016  .  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 Cervical herniated disc (C5-6 and C6-7) [M50.20] 01/11/2016  . Thoracic disc herniation (T1-2) (Left) [M51.24] 01/11/2016  . Pain management [Z51.89] 01/11/2016  . Adenomatous polyp [D36.9] 12/13/2015  . Glaucoma [H40.9] 12/13/2015  . Headache, migraine [G43.909] 12/13/2015  . Chronic pain [G89.29] 12/13/2015  . Chronic pain syndrome [G89.4] 12/13/2015  . Long term prescription opiate use [Z79.899] 12/13/2015  . Chronic abdominal pain (epigastric) [R10.13, G89.29] 12/13/2015  . Cervical spondylosis [M47.812] 12/13/2015  . Lumbar spondylosis [M47.816] 12/13/2015  . Failed back surgical syndrome x 2 (L4-5  PLIF) (Left Laminectomy and partial discectomy) [M53.9] 12/13/2015  . 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 Lumbar facet hypertrophy (Severe at L4-5) [id number] 12/13/2015  . Chronic neck pain (Bilateral) (L>R) [M54.2, G89.29] 12/13/2015  . Cervicogenic headache (Left) [R51] 12/13/2015  . Occipital neuralgia (Left) [M54.81] 12/13/2015  . Chronic shoulder pain (Bilateral) (R>L) [M25.512, G89.29, M25.511] 12/13/2015  . Chronic cervical radicular pain (Bilateral) (L>R) [M54.12, G89.29] 12/13/2015  . Chronic low back pain (Location of Primary Source of Pain) (Bilateral) (R>L) [M54.5, G89.29] 12/13/2015  . Lumbar facet syndrome (Location of Primary Source of Pain) (Bilateral) (R>L) [M54.5] 12/13/2015  . Chronic knee pain (Location of Secondary source of pain) (Bilateral) (R>L) [M25.561, M25.562, G89.29] 12/13/2015  . Chronic lumbar radicular pain (Left) [M54.16, G89.29] 12/13/2015  . Lumbosacral radiculopathy at L4 (Left) [M54.16] 12/13/2015  . Chronic lower extremity pain (Left) [M79.606, G89.29] 12/13/2015  . Posture arthritis of knee (Location of Secondary source of pain) (Bilateral) (R>L) [M17.0] 12/13/2015  . Chronic foot pain (Bilateral) (L>R) [id number], G89.29] 12/13/2015  . Chronic hip pain (Bilateral) (R>L) [M25.559, G89.29] 12/13/2015  . Acne inversa [L73.2] 10/18/2014  . Type 1 diabetes mellitus ([place]) [E10.9] 02/08/2014  . Tobacco use disorder [F17.200] 02/08/2014    Past Psychiatric History: Depression.  Past Medical History:  Past Medical History  Diagnosis Date  . Spondylolisthesis   . Diabetes mellitus without complication ([place])   . Hypertension   . Spinal headache     migraines  . Depression   . Seasonal allergies   . Diabetic neuropathy ([place])   . GERD (gastroesophageal reflux disease)   . Multiple gastric ulcers   . Arthritis   . Rib fracture     right side  . Cervical disc disease 02/08/2014  . Spondylolisthesis of lumbar region 09/15/2015    Past Surgical History  Procedure Laterality Date  . Gastric bypass    . Back surgery    . Breast reduction  surgery    . Appendectomy    . Carpal tunnel release      right   . Joint replacement      left knee  . Knee arthroscopy      Left  . Cataract extraction      right  . Abdominal abscess excision    . Diagnostic laparoscopy      exploratory lap and LOA  . Cholecystectomy    . Hernia repair    . Colonoscopy w/ biopsies and polypectomy    . Wisdom tooth extraction    . Spinal fusion  09-15-2015    L4-5   Family History:  Family History  Problem Relation Age of Onset  . Cancer Mother   . Cancer - Colon Father   . Diabetes Other   . Stroke Other   . Hypertension Other    Family Psychiatric  History: Depression. Social History:  History  Alcohol Use No     History  Drug Use No    Social History   Social History  . Marital Status: Married    Spouse Name: N/A  . Number of Children: N/A  . Years of Education: N/A   Social History Main Topics  . Smoking status: Current Every Day Smoker -- 1.00 packs/day for 25 years    Types: Cigarettes  . Smokeless tobacco: Never Used  . Alcohol Use: No  . Drug Use: No  . Sexual Activity: Not Asked   Other Topics Concern  . None  Social History Narrative    Hospital Course:    Ms. Jaimes is a 50 year old female with a history of depression and chronic pain admitted after suicide attempt by medication overdose.  1. Suicidal ideation. The patient adamantly denies any intention or thoughts or plans to hurt herself or others. She is able to contract for safety. She is forward thinking and optimistic about the future. She is a loving wife.  2. Mood. We continued Cymbalta and Seroquel for depression.  3. Chronic pain. We continued Flexeril and narcotic pain killers. No prescriptions were given.   4. Diabetes. We continued insulin, ADA diet, sliding scale insulin and blood glucose monitoring.   5. GERD. Continued Protonix.  6. Smoking. Nicotine patch was available.  7. Metabolic syndrome monitoring. Lipid profile and TSH are  normal. Hemoglobin A1c is 8.9, prolactin is also 8.9.   8. Disposition. The patient was discharged to home with family. She will follow up with pain clinic for pain management. She will need an appointment with a psychiatrist for medication management. psychiatrist.  Physical Findings: AIMS:  , ,  ,  ,    CIWA:    COWS:     Musculoskeletal: Strength & Muscle Tone: within normal limits Gait & Station: normal Patient leans: N/A  Psychiatric Specialty Exam: Review of Systems  Musculoskeletal: Positive for back pain.  All other systems reviewed and are negative.   Blood pressure 143/80, pulse 66, temperature 98.2 F (36.8 C), temperature source Oral, resp. rate 18, height 5\' 3"  (1.6 m), weight 79.833 kg (176 lb), last menstrual period 11/13/2009, SpO2 100 %.Body mass index is 31.18 kg/(m^2).  See SRA.                                                  Sleep:  Number of Hours: 6.75   Have you used any form of tobacco in the last 30 days? (Cigarettes, Smokeless Tobacco, Cigars, and/or Pipes): Yes  Has this patient used any form of tobacco in the last 30 days? (Cigarettes, Smokeless Tobacco, Cigars, and/or Pipes) Yes, Yes, A prescription for an FDA-approved tobacco cessation medication was offered at discharge and the patient refused  Blood Alcohol level:  Lab Results  Component Value Date   [facility] <5 [id number]    Metabolic Disorder Labs:  Lab Results  Component Value Date   HGBA1C 8.9* 02/13/2016   MPG 157 08/24/2015   Lab Results  Component Value Date   PROLACTIN 8.9 02/13/2016   Lab Results  Component Value Date   CHOL 141 02/13/2016   TRIG 220* 02/13/2016   HDL 40* 02/13/2016   CHOLHDL 3.5 02/13/2016   VLDL 44* 02/13/2016   LDLCALC 57 02/13/2016   LDLCALC SEE COMMENT 06/18/2012    See Psychiatric Specialty Exam and Suicide Risk Assessment completed by Attending Physician prior to discharge.  Discharge destination:  Home  Is patient on  multiple antipsychotic therapies at discharge:  No   Has Patient had three or more failed trials of antipsychotic monotherapy by history:  No  Recommended Plan for Multiple Antipsychotic Therapies: NA  Discharge Instructions    Diet - low sodium heart healthy    Complete by:  As directed      Increase activity slowly    Complete by:  As directed  Medication List    TAKE these medications      Indication   azelastine 0.1 % nasal spray  Commonly known as:  ASTELIN  Place 1 spray into both nostrils 2 (two) times daily as needed for rhinitis.      CALCIUM 600/VITAMIN D3 600-800 MG-UNIT Tabs  Generic drug:  Calcium Carb-Cholecalciferol  Take 1 tablet by mouth daily.      cyclobenzaprine 10 MG tablet  Commonly known as:  FLEXERIL  Take 1 tablet (10 mg total) by mouth 3 (three) times daily as needed for muscle spasms.      DULoxetine 60 MG capsule  Commonly known as:  CYMBALTA  Take 60 mg by mouth 2 (two) times daily.      fluticasone 50 MCG/ACT nasal spray  Commonly known as:  FLONASE  Place 2 sprays into both nostrils daily.      gabapentin 300 MG capsule  Commonly known as:  NEURONTIN  Take 300 mg by mouth at bedtime as needed (for pain).      HYDROcodone-acetaminophen 7.5-325 MG tablet  Commonly known as:  NORCO  Take 1 tablet by mouth 3 (three) times daily as needed for moderate pain.      insulin NPH Human 100 UNIT/ML injection  Commonly known as:  HUMULIN N,NOVOLIN N  Inject 0.15 mLs (15 Units total) into the skin 2 (two) times daily at 8 am and 10 pm.      MIMVEY 1-0.5 MG tablet  Generic drug:  estradiol-norethindrone  Take 1 tablet by mouth daily.      multivitamin with minerals Tabs tablet  Take 1 tablet by mouth daily.      nicotine 21 mg/24hr patch  Commonly known as:  NICODERM CQ - dosed in mg/24 hours  Place 1 patch (21 mg total) onto the skin daily.      pantoprazole 40 MG tablet  Commonly known as:  PROTONIX  Take 40 mg by mouth  daily.      QUEtiapine 100 MG tablet  Commonly known as:  SEROQUEL  Take 100 mg by mouth at bedtime.      sucralfate 1 g tablet  Commonly known as:  CARAFATE  Take 1 g by mouth 4 (four) times daily -  with meals and at bedtime.      trolamine salicylate 10 % cream  Commonly known as:  ASPERCREME  Apply 1 application topically as needed (for pain). Pt applies to her back.      VOLTAREN 1 % Gel  Generic drug:  diclofenac sodium  Apply 2 g topically 4 (four) times daily as needed (for pain). Pt applies to her back.            Follow-up Information    Please follow up.   Why:  Dr. Sabra Heck, [facility]      Follow-up recommendations:  Activity:  As tolerated. Diet:  Low sodium heart healthy ADA diet. Other:  Keep follow-up appointments.  Comments:    Signed: Orson Slick, MD 02/14/2016, 2:25 PM

## 2016-02-14 NOTE — Plan of Care (Signed)
Problem: Ineffective individual coping Goal: STG: Patient will remain free from self harm Outcome: Progressing Pt remains free from harm.  Problem: Alteration in mood Goal: STG-Patient is able to discuss feelings and issues (Patient is able to discuss feelings and issues leading to depression)  Outcome: Progressing Pt discusses issues that lead to depression, including chronic pain issues. She denies SI at this time.

## 2016-02-14 NOTE — Tx Team (Signed)
Interdisciplinary Treatment Plan Update (Adult)        Date: 02/14/2016   Time Reviewed: 9:30 AM   Progress in Treatment: Improving  Attending groups: Continuing to assess, patient new to milieu  Participating in groups: Continuing to assess, patient new to milieu  Taking medication as prescribed: Yes  Tolerating medication: Yes  Family/Significant other contact made: No, CSW assessing for appropriate contacts  Patient understands diagnosis: Yes  Discussing patient identified problems/goals with staff: Yes  Medical problems stabilized or resolved: Yes  Denies suicidal/homicidal ideation: Yes  Issues/concerns per patient self-inventory: Yes  Other:   New problem(s) identified: N/A   Discharge Plan or Barriers: Pt will discharge to [place] to live with her husband and follow up with [facility] for medication management and therapy  Reason for Continuation of Hospitalization:   Depression   Anxiety   Medication Stabilization   Comments: N/A   Estimated length of stay: 3-5 days    Pt is a 50 year old female with a history of depression and chronic pain.  Pt lives in [place], [place]. Pt's chief complaint. "I feel so ashamed."  History of present illness. Information was obtained from the patient and the chart. The patient has a long history of depression that has been managed well with a combination of Cymbalta and Seroquel. The patient also suffers chronic pain. Her pain medication has been prescribed by her primary provider who recently referred her to our pain clinic. At the patient was given injections at the pain clinic but she found that unhelpful. During her last visit that the patient understood that no pain medication will be prescribed. She reports that she was in excruciating pain as injections were not working. She decided that she cannot go on and overdose on medications. Her husband found her and brought her to the hospital. She was confused and aggressive on admission. She was  admitted to CCU and was intubated. She was seen on the medical floor by Dr. Weber Cooks, our psychiatric consultant, and was transferred to psychiatry for further treatment. The patient denies any symptoms of depression as they were well controlled with her current regimen. She no longer feels suicidal. When she woke up on the medical floor and so her husband so taken and suffering she decided that she could never attempted suicide again. She decided that she will try another pain clinic to improve the quality of life. She spoke with her husband at length and she realizes how selfish her act has been. She is very remorseful. She denies symptoms of anxiety, psychosis, or symptoms suggestive of bipolar mania. She denies alcohol, illicit drugs, or prescription pill abuse.  Past psychiatric history. There was one suicide attempt over 20 years ago before she was diagnosed with depression and treated. She was treated in the past with Wellbutrin that she did not like. Cymbalta and Paxil worked better for her. Addition of Seroquel made enormous difference in controlling her depressive symptoms. Family psychiatric history. Father with depression.  Social history. She is a retired [employer] scientist. She lives with her husband of 35. She she suffers chronic back and neck pain that has not been adequately addressed. Patient will benefit from crisis stabilization, medication evaluation, group therapy, and psycho education in addition to case management for discharge planning. Patient and CSW reviewed pt's identified goals and treatment plan. Pt verbalized understanding and agreed to treatment plan.    Review of initial/current patient goals per problem list:  1. Goal(s): Patient will participate in aftercare plan   Met:  Yes  Target date: 3-5 days post admission date   As evidenced by: Patient will participate within aftercare plan AEB aftercare provider and housing plan at discharge being identified.   5/2: Pt will discharge to  [place] to live with her husband and follow up with [facility] for medication management and therapy    2. Goal (s): Patient will exhibit decreased depressive symptoms and suicidal ideations.   Met: Adequate for discharge per MD.  Target date: 3-5 days post admission date   As evidenced by: Patient will utilize self-rating of depression at 3 or below and demonstrate decreased signs of depression or be deemed stable for discharge by MD.   5/2: Adequate for discharge per MD.  Pt denies SI/HI.  Pt reports he is safe for discharge.   3. Goal(s): Patient will demonstrate decreased signs and symptoms of anxiety.   Met: Adequate for discharge per MD.  Target date: 3-5 days post admission date   As evidenced by: Patient will utilize self-rating of anxiety at 3 or below and demonstrated decreased signs of anxiety, or be deemed stable for discharge by MD  5/2: Adequate for discharge per MD.  Pt reports baseline symptoms of anxiety     4. Goal(s): Patient will demonstrate decreased signs of withdrawal due to substance abuse   Met: Yes  Target date: 3-5 days post admission date   As evidenced by: Patient will produce a CIWA/COWS score of 0, have stable vitals signs, and no symptoms of withdrawal   5/2: Patient produced a CIWA/COWS score of 0, has stable vitals signs, and no symptoms of withdrawal       Attendees:  Patient:  Brittany Cummings Family:  Physician: Dr. Bary Leriche, MD    02/14/2016 9:30 AM  Nursing: Polly Cobia, RN     02/14/2016 9:30 AM  Clinical Social Worker: Marylou Flesher, [place]  02/14/2016 9:30 AM  Clinical Social Worker: Carmell Austria, [place]  02/14/2016 9:30 AM  Recreational Therapist: Everitt Amber, LRT  02/14/2016 9:30 AM  Psychologist: Consuella Lose. Psy D  02/14/2016 9:30 AM  Nursing: Abran Cantor, RN   02/14/2016 9:30 AM    Alphonse Guild. Caylei Sperry, LCSWA, LCAS  02/14/16

## 2016-02-14 NOTE — Progress Notes (Signed)
D: Pt is pleasant and cooperative this evening. She discusses with writer her issues with chronic back pain. She verbalizes regret over her attempted OD and states, "I would never try anything like that again. I can't do that to my husband." Pt denies SI/HI/AVH at this time. She rates her pain a 9/10 and is given scheduled pain medication.  A: Emotional support and encouragement provided. Medications administered with education. q15 minute safety checks maintained. R: Pt remains free from harm. Will continue to monitor.

## 2016-02-14 NOTE — Progress Notes (Signed)
Recreation Therapy Notes  Date: 05.02.17 Time: 3:00 pm Location: Craft Room  Group Topic: Decision Making  Goal Area(s) Addresses:  Patient will be able to identify a good decision they have made. Patient will be able to identify a bad decision they have made.  Behavioral Response: Attentive, Interactive  Intervention: Mind Mapping  Activity: Patients were given a mind map and instructed to write 4 bad decisions they have made and the consequences of those decisions as well as 4 good decisions they have made and the outcome of those decisions.  Education: LRT educated patients on why it is important make good decisions.  Education Outcome: Acknowledges education/In group clarification offered   Clinical Observations/Feedback: Patient completed activity by writing 4 good decisions, 4 bad decisions, and the outcomes of those decisions. Patient contributed to group discussion by stating that she can use this activity to help her make better decisions in the future.  Leonette Monarch, LRT/CTRS 02/14/2016 4:13 PM

## 2016-02-14 NOTE — [facility] Counselor (Signed)
Patient discharged in less than 24 hours of admission, no PSA required.  Carmell Austria, MSW, LCSW [phone]

## 2016-02-16 ENCOUNTER — Telehealth: Payer: Self-pay

## 2016-02-16 LAB — CULTURE, BLOOD (ROUTINE X 2)
CULTURE: NO GROWTH
Culture: NO GROWTH

## 2016-02-16 NOTE — Telephone Encounter (Deleted)
Patient wants to know what she should do she is in pain again.. Its unbearable should she go back to the ER

## 2016-03-01 ENCOUNTER — Ambulatory Visit: Payer: Medicare Other | Admitting: Pain Medicine

## 2016-08-09 ENCOUNTER — Other Ambulatory Visit: Payer: Self-pay | Admitting: Neurosurgery

## 2016-08-09 DIAGNOSIS — G8929 Other chronic pain: Secondary | ICD-10-CM

## 2016-08-09 DIAGNOSIS — M545 Low back pain: Principal | ICD-10-CM

## 2016-08-16 ENCOUNTER — Ambulatory Visit
Admission: RE | Admit: 2016-08-16 | Discharge: 2016-08-16 | Disposition: A | Discharge status: Home / Self Care | Payer: Medicare Other | Source: Ambulatory Visit | Attending: Neurosurgery | Admitting: Neurosurgery

## 2016-08-16 DIAGNOSIS — Z9889 Other specified postprocedural states: Secondary | ICD-10-CM | POA: Diagnosis not present

## 2016-08-16 DIAGNOSIS — G8929 Other chronic pain: Secondary | ICD-10-CM | POA: Insufficient documentation

## 2016-08-16 DIAGNOSIS — M47896 Other spondylosis, lumbar region: Secondary | ICD-10-CM | POA: Insufficient documentation

## 2016-08-16 DIAGNOSIS — M545 Low back pain: Secondary | ICD-10-CM | POA: Insufficient documentation

## 2016-08-17 IMAGING — CR DG CHEST 2V
2 series · 2 of 2 positions shown · non-contrast
Comparison: PA and lateral chest 05/08/2012.  CT chest 12/26/2012.

CLINICAL DATA: Status post trip and fall last night with a blow to
the right side of the chest. Pain. Initial encounter.

EXAM:
CHEST  2 VIEW

[chest pa]
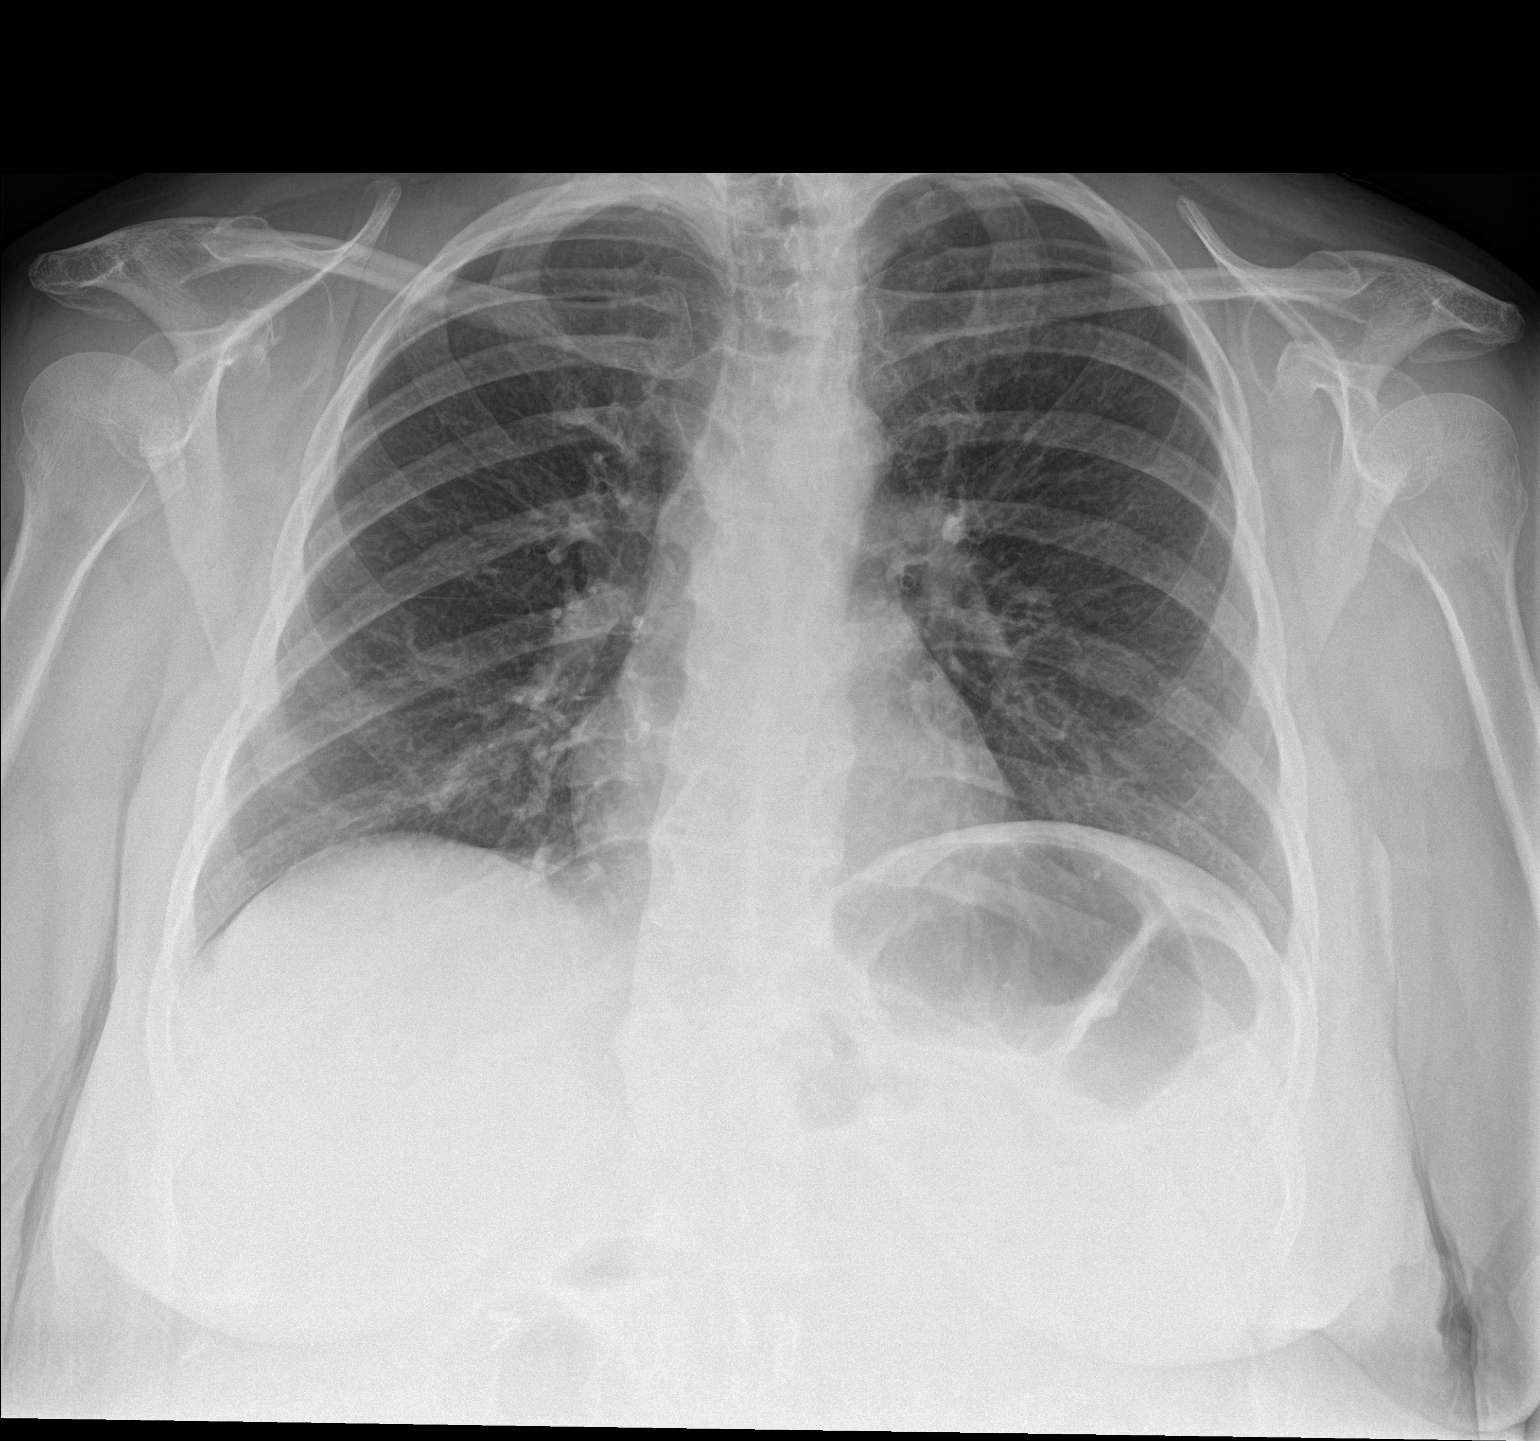

[chest lat]
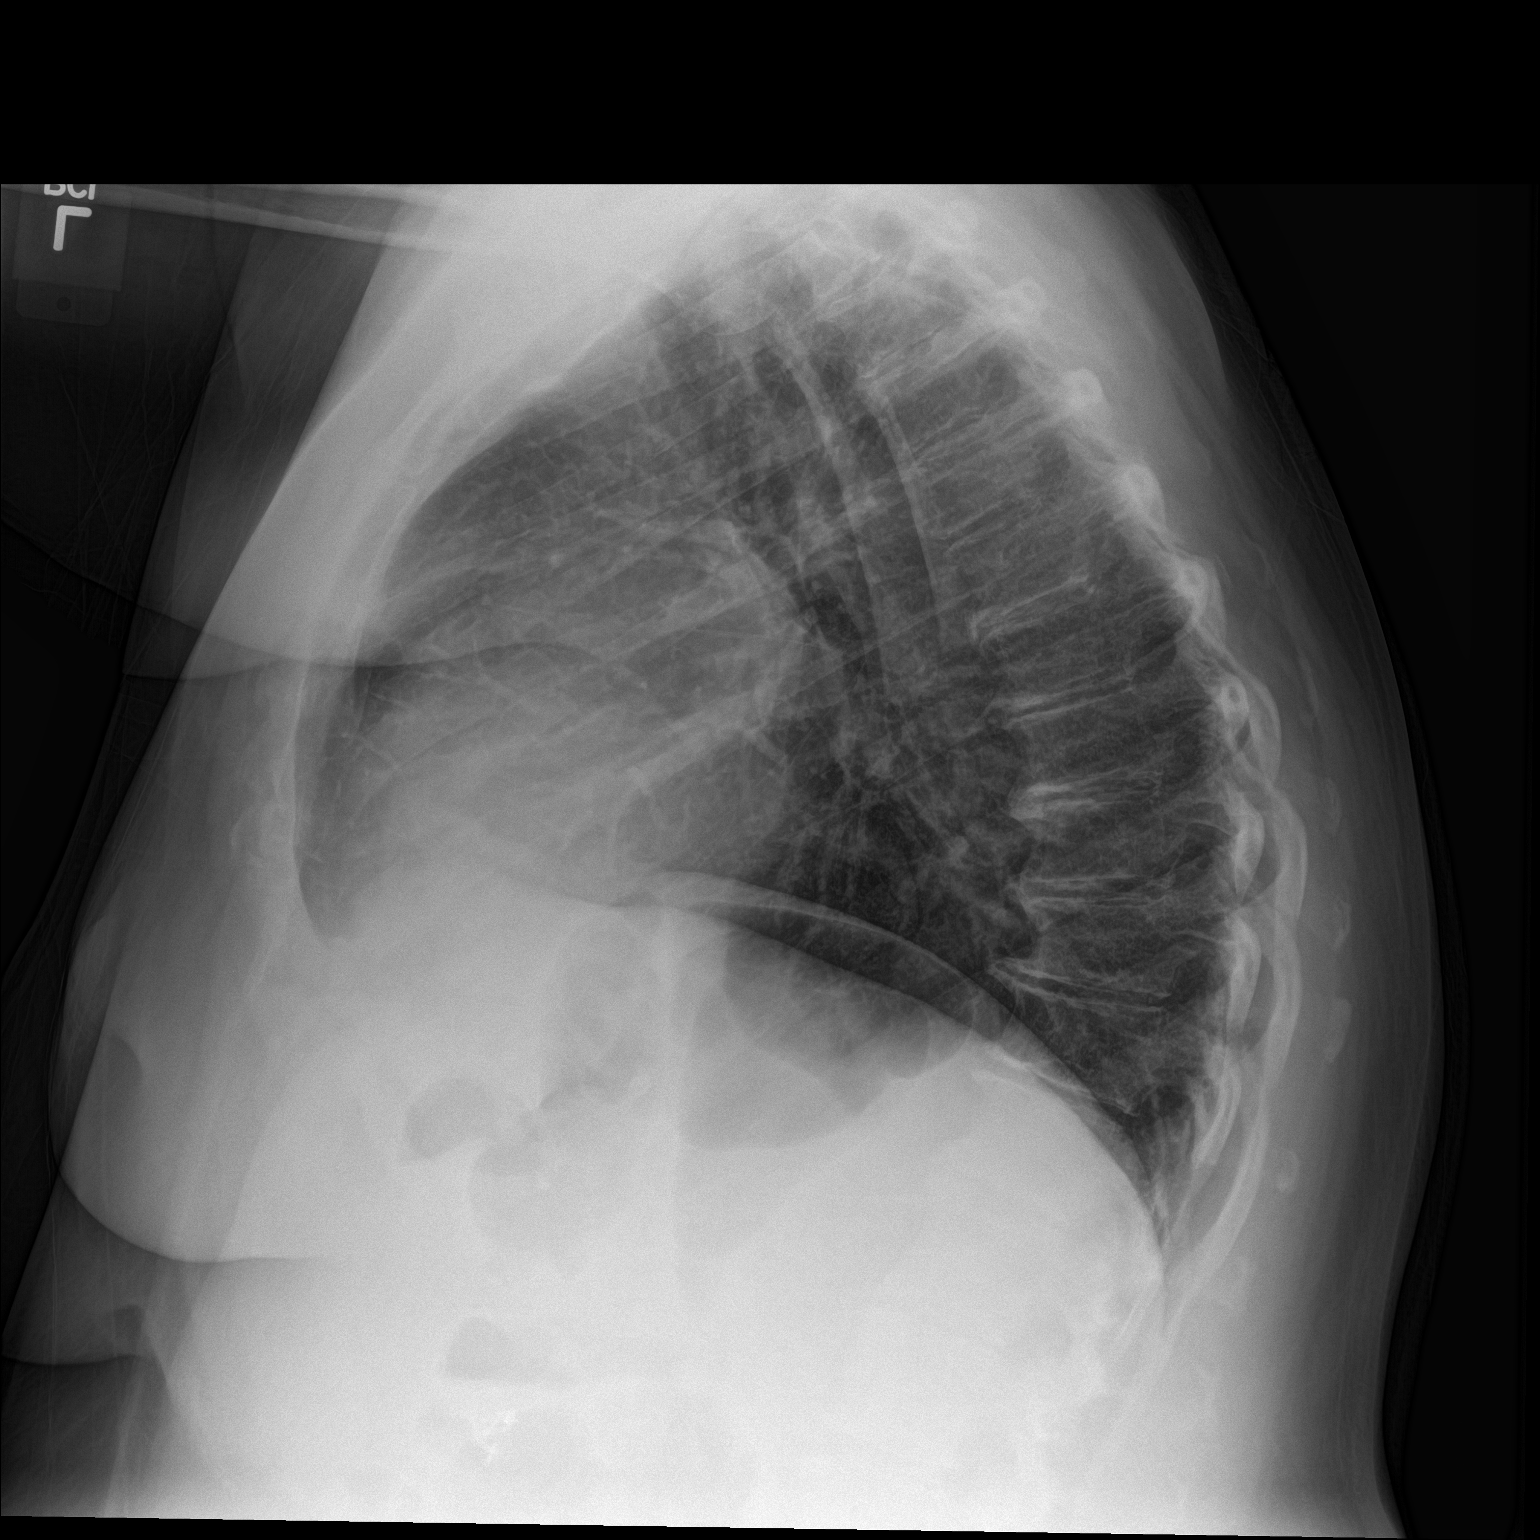

[2 of 2 positions shown; findings below may reference images not displayed]

FINDINGS: The lungs are clear. No pneumothorax or pleural effusion. Heart size
is normal. No focal bony abnormality.
IMPRESSION: Negative chest.

## 2016-08-19 IMAGING — CR DG RIBS W/ CHEST 3+V*R*
3 series · 4 of 4 positions shown · non-contrast
Comparison: Chest radiographs 08/30/2015

CLINICAL DATA: RIGHT-sided anterior rib/chest pain after fall, pain
persisting for 2 days, hurts to take a deep breath, hypertension,
diabetes mellitus, smoker

EXAM:
RIGHT RIBS AND CHEST - 3+ VIEW

[chest pa]
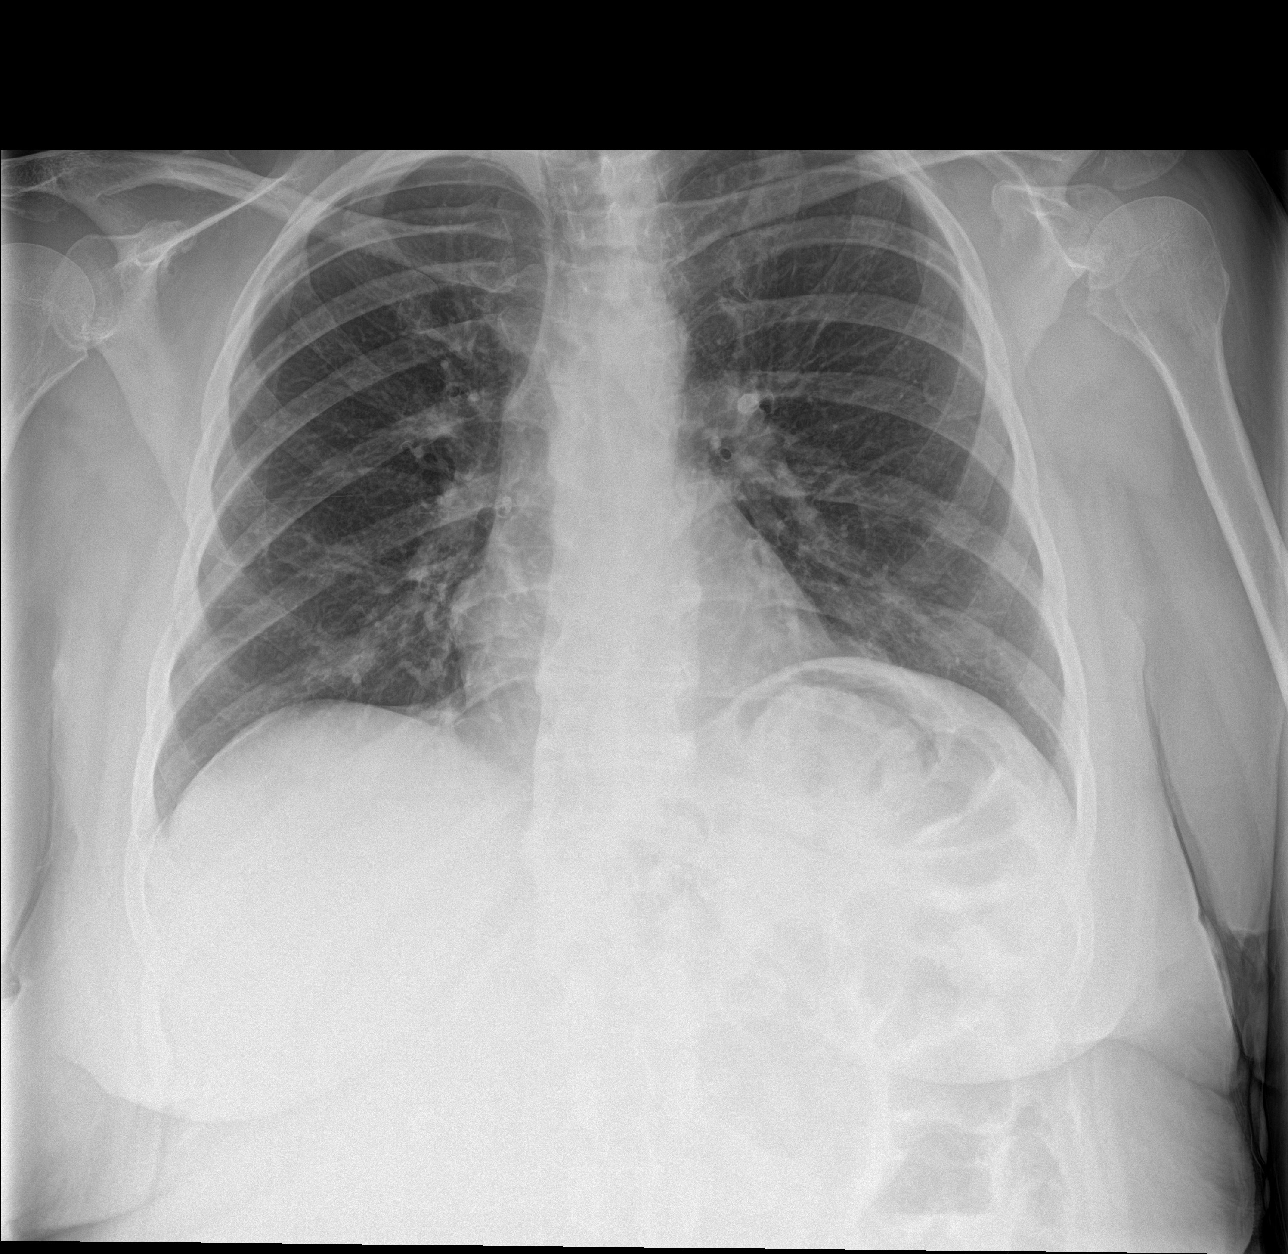

[Series 2: rib pa · 0.14mm/px · 2 of 2 slices shown]
[im 1/2]
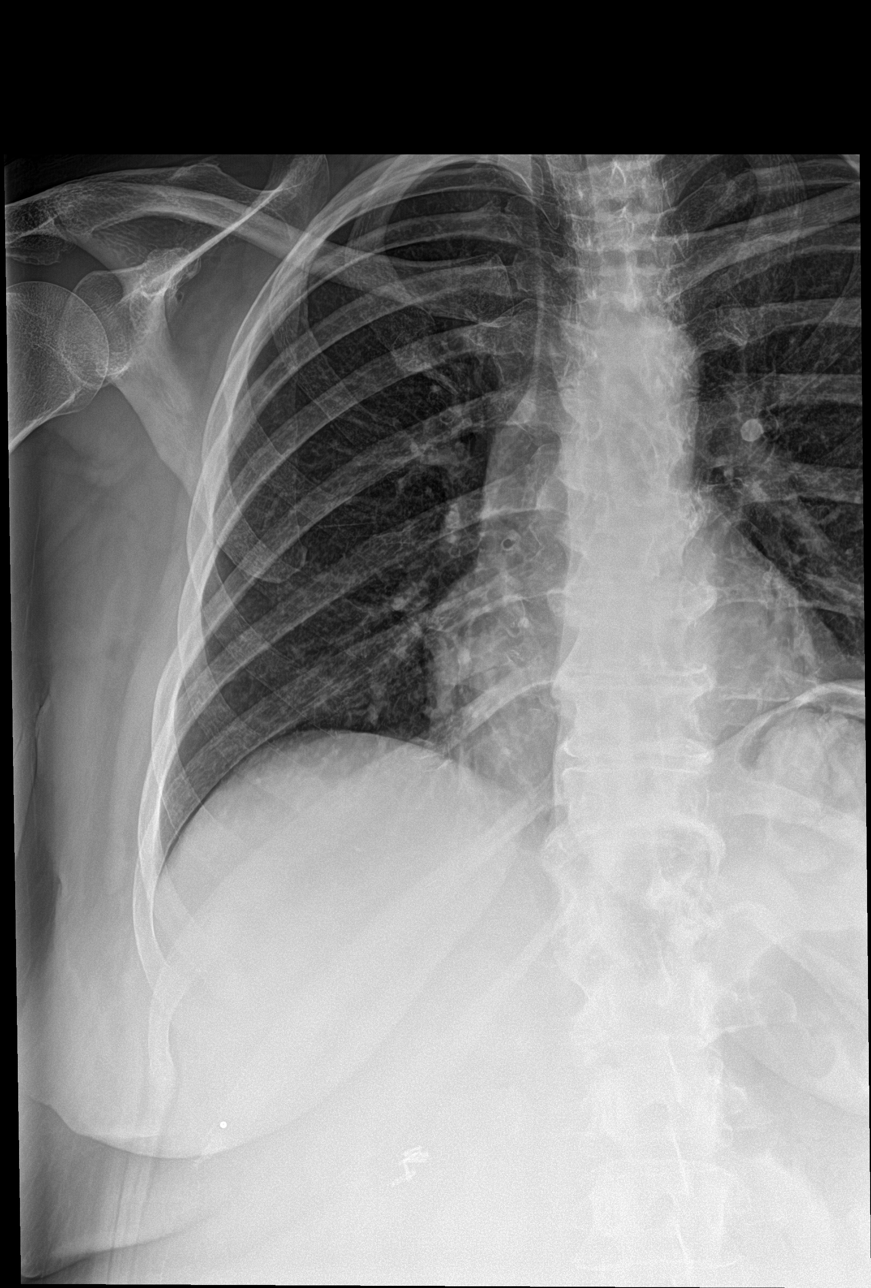
[im 2/2]
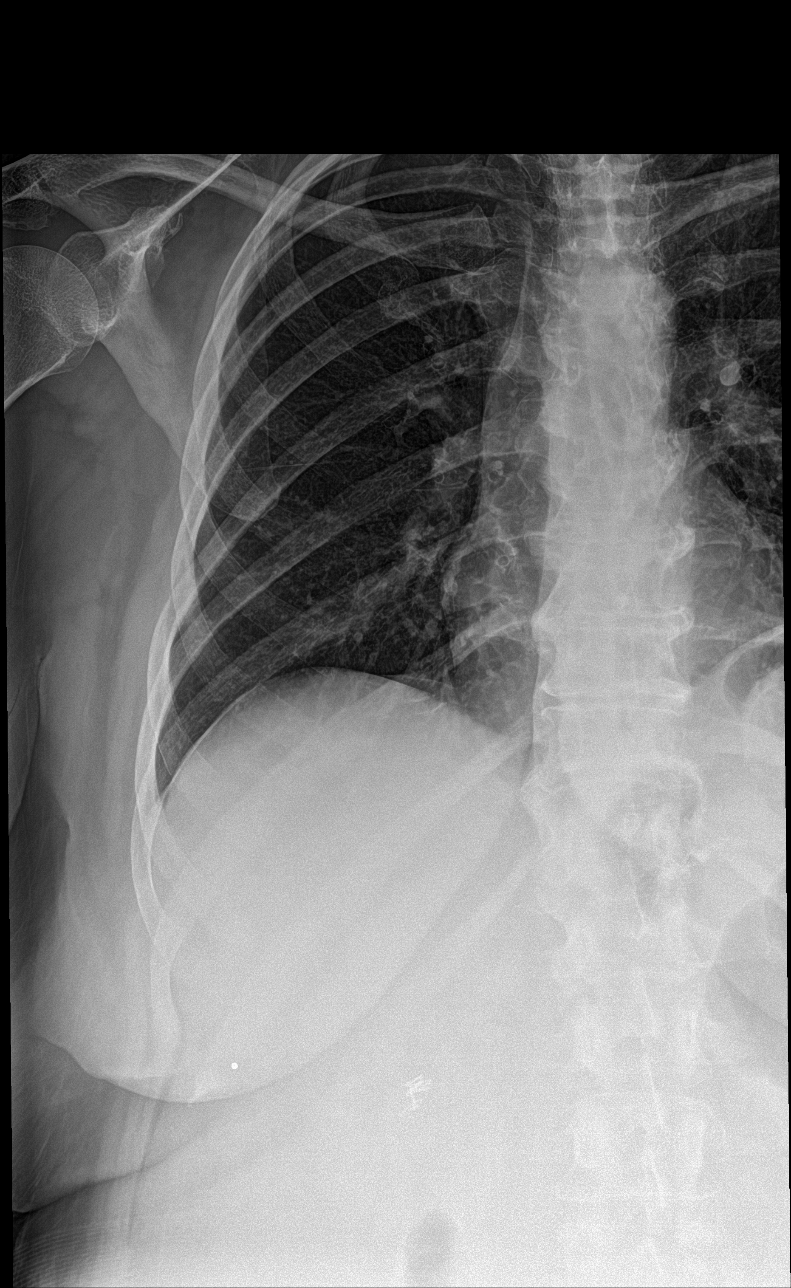

[rib pa obl]
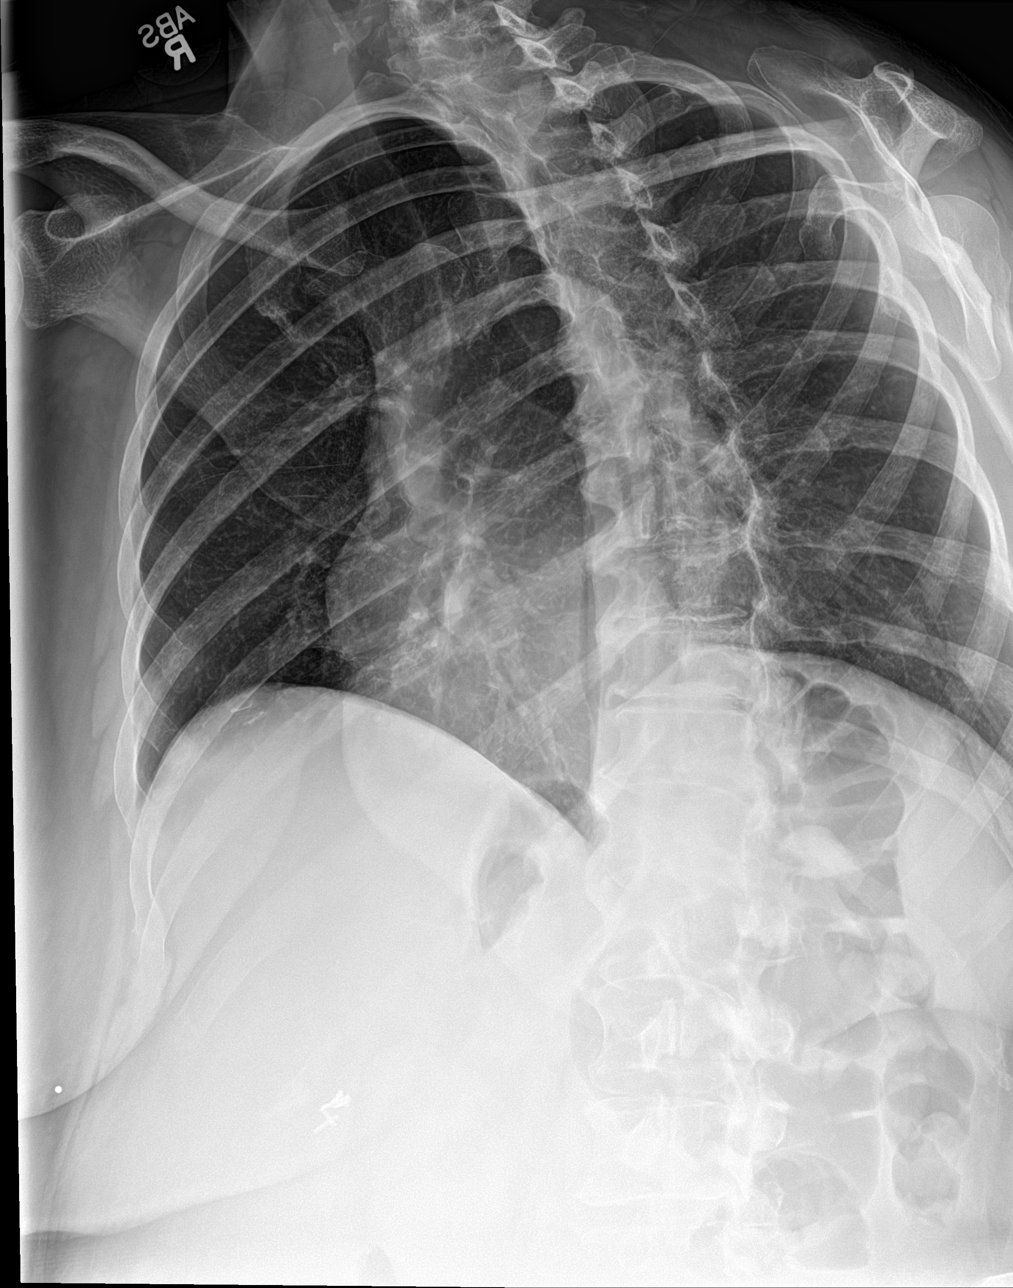

[4 of 4 positions shown; findings below may reference images not displayed]

FINDINGS: Normal heart size, mediastinal contours, and pulmonary vascularity.

Bronchitic changes without infiltrate, pleural effusion or
pneumothorax.

Osseous mineralization grossly normal.

Degenerative disc disease changes thoracic spine.

BB placed at site of symptoms lower RIGHT chest ; this projects over
costal cartilage.

Questionable contour abnormality at the anterior RIGHT seventh rib,
cannot exclude subtle nondisplaced fracture ; this is approximately
8 cm distant from the BB.
IMPRESSION: Bronchitic changes.

Questionable nondisplaced fracture anterior RIGHT seventh rib as
above.

## 2017-01-26 ENCOUNTER — Encounter: Payer: Self-pay | Admitting: Emergency Medicine

## 2017-01-26 DIAGNOSIS — R1010 Upper abdominal pain, unspecified: Secondary | ICD-10-CM | POA: Insufficient documentation

## 2017-01-26 DIAGNOSIS — E119 Type 2 diabetes mellitus without complications: Secondary | ICD-10-CM | POA: Diagnosis not present

## 2017-01-26 DIAGNOSIS — Z5321 Procedure and treatment not carried out due to patient leaving prior to being seen by health care provider: Secondary | ICD-10-CM | POA: Diagnosis not present

## 2017-01-26 DIAGNOSIS — Z794 Long term (current) use of insulin: Secondary | ICD-10-CM | POA: Insufficient documentation

## 2017-01-26 DIAGNOSIS — I10 Essential (primary) hypertension: Secondary | ICD-10-CM | POA: Diagnosis not present

## 2017-01-26 DIAGNOSIS — F17210 Nicotine dependence, cigarettes, uncomplicated: Secondary | ICD-10-CM | POA: Insufficient documentation

## 2017-01-26 LAB — CBC
HEMATOCRIT: 48.7 % — AB (ref 35.0–47.0)
HEMOGLOBIN: 16.7 g/dL — AB (ref 12.0–16.0)
MCH: 28.5 pg (ref 26.0–34.0)
MCHC: 34.2 g/dL (ref 32.0–36.0)
MCV: 83.4 fL (ref 80.0–100.0)
PLATELETS: 264 10*3/uL (ref 150–440)
RBC: 5.85 MIL/uL — ABNORMAL HIGH (ref 3.80–5.20)
RDW: 13.5 % (ref 11.5–14.5)
WBC: 17.7 10*3/uL — AB (ref 3.6–11.0)

## 2017-01-26 LAB — LIPASE, BLOOD: LIPASE: 29 U/L (ref 11–51)

## 2017-01-26 LAB — URINALYSIS, COMPLETE (UACMP) WITH MICROSCOPIC
BILIRUBIN URINE: NEGATIVE
Glucose, UA: >=500 mg/dL — AB
Hgb urine dipstick: NEGATIVE
KETONES UR: NEGATIVE mg/dL
Nitrite: NEGATIVE
PH: 5 (ref 5.0–8.0)
PROTEIN: NEGATIVE mg/dL
Specific Gravity, Urine: 1.015 (ref 1.005–1.030)

## 2017-01-26 LAB — COMPREHENSIVE METABOLIC PANEL
ALT: 23 U/L (ref 14–54)
AST: 32 U/L (ref 15–41)
Albumin: 3.7 g/dL (ref 3.5–5.0)
Alkaline Phosphatase: 56 U/L (ref 38–126)
Anion gap: 5 (ref 5–15)
BUN: 14 mg/dL (ref 6–20)
CHLORIDE: 107 mmol/L (ref 101–111)
CO2: 25 mmol/L (ref 22–32)
Calcium: 8.9 mg/dL (ref 8.9–10.3)
Creatinine, Ser: 0.66 mg/dL (ref 0.44–1.00)
Glucose, Bld: 247 mg/dL — ABNORMAL HIGH (ref 65–99)
POTASSIUM: 5.1 mmol/L (ref 3.5–5.1)
SODIUM: 137 mmol/L (ref 135–145)
Total Bilirubin: 0.3 mg/dL (ref 0.3–1.2)
Total Protein: 6.8 g/dL (ref 6.5–8.1)

## 2017-01-26 MED ORDER — ONDANSETRON 4 MG PO TBDP
4.0000 mg | ORAL_TABLET | Freq: Once | ORAL | Status: DC | PRN
Start: 2017-01-26 — End: 2017-01-27

## 2017-01-26 NOTE — ED Triage Notes (Signed)
Patient with complaint of upper abdominal pain times one week. Patient reports that the pain has become worse over the past 2 days. Patient states that she has a history of gastric ulcers and the pain is similar. Patient reports that she has been taking Carafate at home with no improvement.

## 2017-01-27 ENCOUNTER — Emergency Department
Admission: EM | Admit: 2017-01-27 | Discharge: 2017-01-27 | Disposition: A | Discharge status: Home / Self Care | Payer: Medicare Other | Attending: Emergency Medicine | Admitting: Emergency Medicine

## 2017-01-28 ENCOUNTER — Telehealth: Payer: Self-pay | Admitting: Emergency Medicine

## 2017-01-28 NOTE — Telephone Encounter (Signed)
Called patient due to lwot to inquire about condition and follow up plans. Another person answered and says patient is sleeping.  I asked him to make sure she follows up with her doctor.  He agrees.

## 2017-01-29 ENCOUNTER — Other Ambulatory Visit: Payer: Self-pay | Admitting: Internal Medicine

## 2017-01-29 DIAGNOSIS — M5414 Radiculopathy, thoracic region: Secondary | ICD-10-CM

## 2017-01-29 DIAGNOSIS — S22000A Wedge compression fracture of unspecified thoracic vertebra, initial encounter for closed fracture: Secondary | ICD-10-CM

## 2017-02-06 ENCOUNTER — Ambulatory Visit: Payer: Medicare Other

## 2017-02-08 ENCOUNTER — Ambulatory Visit
Admission: RE | Admit: 2017-02-08 | Discharge: 2017-02-08 | Disposition: A | Discharge status: Home / Self Care | Payer: Medicare Other | Source: Ambulatory Visit | Attending: Internal Medicine | Admitting: Internal Medicine

## 2017-02-08 DIAGNOSIS — M5414 Radiculopathy, thoracic region: Secondary | ICD-10-CM | POA: Diagnosis present

## 2017-02-08 DIAGNOSIS — M4804 Spinal stenosis, thoracic region: Secondary | ICD-10-CM | POA: Diagnosis not present

## 2017-02-08 DIAGNOSIS — S22000A Wedge compression fracture of unspecified thoracic vertebra, initial encounter for closed fracture: Secondary | ICD-10-CM

## 2017-02-08 DIAGNOSIS — M5124 Other intervertebral disc displacement, thoracic region: Secondary | ICD-10-CM | POA: Insufficient documentation

## 2017-03-26 ENCOUNTER — Emergency Department
Admission: EM | Admit: 2017-03-26 | Discharge: 2017-03-26 | Disposition: A | Discharge status: Home / Self Care | Payer: Medicare Other | Attending: Emergency Medicine | Admitting: Emergency Medicine

## 2017-03-26 ENCOUNTER — Encounter: Payer: Self-pay | Admitting: Emergency Medicine

## 2017-03-26 DIAGNOSIS — E119 Type 2 diabetes mellitus without complications: Secondary | ICD-10-CM | POA: Insufficient documentation

## 2017-03-26 DIAGNOSIS — Z79899 Other long term (current) drug therapy: Secondary | ICD-10-CM | POA: Diagnosis not present

## 2017-03-26 DIAGNOSIS — L0291 Cutaneous abscess, unspecified: Secondary | ICD-10-CM

## 2017-03-26 DIAGNOSIS — Z9049 Acquired absence of other specified parts of digestive tract: Secondary | ICD-10-CM | POA: Insufficient documentation

## 2017-03-26 DIAGNOSIS — I10 Essential (primary) hypertension: Secondary | ICD-10-CM | POA: Insufficient documentation

## 2017-03-26 DIAGNOSIS — F17210 Nicotine dependence, cigarettes, uncomplicated: Secondary | ICD-10-CM | POA: Insufficient documentation

## 2017-03-26 DIAGNOSIS — L0231 Cutaneous abscess of buttock: Secondary | ICD-10-CM | POA: Diagnosis not present

## 2017-03-26 DIAGNOSIS — Z794 Long term (current) use of insulin: Secondary | ICD-10-CM | POA: Diagnosis not present

## 2017-03-26 MED ORDER — CLINDAMYCIN HCL 150 MG PO CAPS: ORAL_CAPSULE | ORAL | 0 refills | Status: AC

## 2017-03-26 NOTE — ED Notes (Signed)
Pt refused to have vital signs rechecked. States "she didn't do anything for me so I don't need to have them rechecked." pt angry upon DC. Pt did agree to sign for DC. Pt given prescription for antibiotic, informed to take antibiotic and follow up with her doctor. Pt states "I just want to get out of here." Pt ambulatory with visitor to lobby.

## 2017-03-26 NOTE — ED Triage Notes (Signed)
presents with possible abscess area to right buttocks  states she noticed the area about 1 week ago  Now also has additional abscess closer to vaginal area

## 2017-03-26 NOTE — Discharge Instructions (Signed)
Follow-up with Dr. Emily Filbert if any continued problems. Use warm compresses frequently to the area with a warm washcloth. Begin taking antibiotics 2 capsules every 6 hours for the next 7 days. Continue your Norco at home as directed by Dr. Sabra Heck. You need to follow-up with Dr. Sabra Heck if any continued pain medication is needed.

## 2017-03-26 NOTE — ED Provider Notes (Signed)
[facility] Emergency Department Provider Note   ____________________________________________   First MD Initiated Contact with Patient 03/26/17 1034     (approximate)  I have reviewed the triage vital signs and the nursing notes.   HISTORY  Chief Complaint Abscess    HPI Brittany Cummings is a 51 y.o. female is here with complaint of an abscess on her right buttocksfor approximately one week. Patient states that she has an additional abscess closer to the vaginal area. Patient has a history of abscesses but has not actually been told that she has MRSA. She does remember once she had vancomycin and the last time clindamycin. Currently she rates her pain as a 10 over 10. She currently is taking Norco that was prescribed by Dr. Sabra Heck. She states she is taking 2 at a time without any relief of her pain. She rates her pain as a 10 over 10.   Past Medical History:  Diagnosis Date  . Arthritis   . Cervical disc disease 02/08/2014  . Depression   . Diabetes mellitus without complication ([place])   . Diabetic neuropathy ([place])   . GERD (gastroesophageal reflux disease)   . Hypertension   . Multiple gastric ulcers   . Rib fracture    right side  . Seasonal allergies   . Spinal headache    migraines  . Spondylolisthesis   . Spondylolisthesis of lumbar region 09/15/2015    Patient Active Problem List   Diagnosis Date Noted  . Severe recurrent major depression without psychotic features ([place]) 02/12/2016  . Suicidal ideation 02/12/2016  . Intentional benzodiazepine overdose ([place]) 02/12/2016  . Lumbar foraminal stenosis (Severe) (L4-5) (Left) 01/11/2016  .  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 Cervical herniated disc (C5-6 and C6-7) 01/11/2016  . Thoracic disc herniation (T1-2) (Left) 01/11/2016  . Pain management 01/11/2016  . Adenomatous polyp 12/13/2015  . Glaucoma 12/13/2015  . Headache, migraine 12/13/2015  . Chronic  pain 12/13/2015  . Chronic pain syndrome 12/13/2015  . Long term prescription opiate use 12/13/2015  . Chronic abdominal pain (epigastric) 12/13/2015  . Cervical spondylosis 12/13/2015  . Lumbar spondylosis 12/13/2015  . 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) 12/13/2015  . Lumbar facet hypertrophy (Severe at L4-5) 12/13/2015  . Chronic neck pain (Bilateral) (L>R) 12/13/2015  . Cervicogenic headache (Left) 12/13/2015  . Occipital neuralgia (Left) 12/13/2015  . Chronic shoulder pain (Bilateral) (R>L) 12/13/2015  . Chronic cervical radicular pain (Bilateral) (L>R) 12/13/2015  . Chronic low back pain (Location of Primary Source of Pain) (Bilateral) (R>L) 12/13/2015  . Lumbar facet syndrome (Location of Primary Source of Pain) (Bilateral) (R>L) 12/13/2015  . Chronic knee pain (Location of Secondary source of pain) (Bilateral) (R>L) 12/13/2015  . Chronic lumbar radicular pain (Left) 12/13/2015  . Lumbosacral radiculopathy at L4 (Left) 12/13/2015  . Chronic lower extremity pain (Left) 12/13/2015  . Posture arthritis of knee (Location of Secondary source of pain) (Bilateral) (R>L) 12/13/2015  . Chronic foot pain (Bilateral) (L>R) 12/13/2015  . Chronic hip pain (Bilateral) (R>L) 12/13/2015  . Acne inversa 10/18/2014  . Type 1 diabetes mellitus ([place]) 02/08/2014  . Tobacco use disorder 02/08/2014    Past Surgical History:  Procedure Laterality Date  . abdominal abscess excision    . APPENDECTOMY    . BACK SURGERY    . BREAST REDUCTION SURGERY    .  CARPAL TUNNEL RELEASE     right   . CATARACT EXTRACTION     right  . CHOLECYSTECTOMY    . COLONOSCOPY W/ BIOPSIES AND POLYPECTOMY    . DIAGNOSTIC LAPAROSCOPY     exploratory lap and LOA  . GASTRIC BYPASS    . HERNIA REPAIR    . JOINT REPLACEMENT     left knee  . KNEE ARTHROSCOPY     Left  . SPINAL FUSION  09-15-2015    L4-5  . WISDOM TOOTH EXTRACTION      Prior to Admission medications   Medication Sig Start Date End Date Taking? Authorizing Provider  azelastine (ASTELIN) 0.1 % nasal spray Place 1 spray into both nostrils 2 (two) times daily as needed for rhinitis.     [provider]  Calcium Carb-Cholecalciferol (CALCIUM 600/VITAMIN D3) 600-800 MG-UNIT TABS Take 1 tablet by mouth daily.     [provider]  clindamycin (CLEOCIN) 150 MG capsule Take 2 capsules qid until finished 03/26/17   Letitia Neri L, PA-C  cyclobenzaprine (FLEXERIL) 10 MG tablet Take 1 tablet (10 mg total) by mouth 3 (three) times daily as needed for muscle spasms. 09/16/15   Kritzer, Louie Casa, MD  diclofenac sodium (VOLTAREN) 1 % GEL Apply 2 g topically 4 (four) times daily as needed (for pain). Pt applies to her back.    [provider]  DULoxetine (CYMBALTA) 60 MG capsule Take 60 mg by mouth 2 (two) times daily.    [provider]  estradiol-norethindrone (MIMVEY) 1-0.5 MG tablet Take 1 tablet by mouth daily.     [provider]  fluticasone (FLONASE) 50 MCG/ACT nasal spray Place 2 sprays into both nostrils daily.    [provider]  gabapentin (NEURONTIN) 300 MG capsule Take 300 mg by mouth at bedtime as needed (for pain).     [provider]  HYDROcodone-acetaminophen (NORCO) 7.5-325 MG tablet Take 1 tablet by mouth 3 (three) times daily as needed for moderate pain.    [provider]  insulin NPH Human (HUMULIN N,NOVOLIN N) 100 UNIT/ML injection Inject 0.15 mLs (15 Units total) into the skin 2 (two) times daily at 8 am and 10 pm. 02/13/16   Loletha Grayer, MD  Multiple Vitamin (MULTIVITAMIN WITH MINERALS) TABS tablet Take 1 tablet by mouth daily.    [provider]  nicotine (NICODERM CQ - DOSED IN MG/24 HOURS) 21 mg/24hr patch Place 1 patch (21 mg total) onto the skin daily. 02/13/16   Loletha Grayer, MD  pantoprazole (PROTONIX) 40 MG tablet Take 40 mg  by mouth daily.     [provider]  QUEtiapine (SEROQUEL) 100 MG tablet Take 100 mg by mouth at bedtime.     [provider]  sucralfate (CARAFATE) 1 G tablet Take 1 g by mouth 4 (four) times daily -  with meals and at bedtime.    [provider]  trolamine salicylate (ASPERCREME) 10 % cream Apply 1 application topically as needed (for pain). Pt applies to her back.    [provider]    Allergies Meperidine  Family History  Problem Relation Age of Onset  . Cancer Mother   . Cancer - Colon Father   . Diabetes Other   . Stroke Other   . Hypertension Other     Social History Social History  Substance Use Topics  . Smoking status: Current Every Day Smoker    Packs/day: 1.00    Years: 25.00    Types: Cigarettes  .  Smokeless tobacco: Never Used  . Alcohol use No    Review of Systems Constitutional: No fever/chills Cardiovascular: Denies chest pain. Respiratory: Denies shortness of breath. Gastrointestinal:   No nausea, no vomiting. Musculoskeletal: Negative for back pain. Skin: Positive for abscesses. Neurological: Negative for headaches, focal weakness or numbness.  ____________________________________________   PHYSICAL EXAM:  VITAL SIGNS: ED Triage Vitals  Enc Vitals Group     BP 03/26/17 1033 (!) 148/90     Pulse Rate 03/26/17 1033 (!) 130     Resp 03/26/17 1033 18     Temp 03/26/17 1033 98.2 F (36.8 C)     Temp Source 03/26/17 1033 Oral     SpO2 03/26/17 1033 99 %     Weight 03/26/17 1033 168 lb (76.2 kg)     Height 03/26/17 1033 5\' 3"  (1.6 m)     Head Circumference --      Peak Flow --      Pain Score 03/26/17 1120 10     Pain Loc --      Pain Edu? --      Excl. in [place]? --     Constitutional: Alert and oriented. Well appearing and in no acute distress. Eyes: Conjunctivae are normal. PERRL. EOMI. Head: Atraumatic. Cardiovascular: Normal rate, regular rhythm. Grossly normal heart sounds.  Good peripheral  circulation. Respiratory: Normal respiratory effort.  No retractions. Lungs CTAB. Musculoskeletal: Moves upper and lower extremities without assistance. Patient has surgical scar lumbar spine from previous back surgery. Neurologic:  Normal speech and language. No gross focal neurologic deficits are appreciated. No gait instability. Skin:  Skin is warm, dry. There is a 1 cm papule on the right medial buttocks near the anus with minimal erythema. Skin is unroofed with the appearance that it has drained. There is tenderness on palpation. No active drainage at this time. There is also a small red papule posterior right labia without cystic formation. Psychiatric: Mood and affect are normal. Speech and behavior are normal.  ____________________________________________   LABS (all labs ordered are listed, but only abnormal results are displayed)  Labs Reviewed - No data to display   PROCEDURES  Procedure(s) performed: None  Procedures  Critical Care performed: No  ____________________________________________   INITIAL IMPRESSION / ASSESSMENT AND PLAN / ED COURSE  Pertinent labs & imaging results that were available during my care of the patient were reviewed by me and considered in my medical decision making (see chart for details).  Patient is current issues warm compresses to the area frequently. She is placed on a prescription of clindamycin 150 mg 2 capsules every 6 hours for the next 7 days. She is also to follow-up with Dr. Emily Filbert. Patient states that she is taking Norco 2 tablets every 4 hours for pain and is requesting something stronger. In looking through her records it appears that she is been treated for chronic pain syndrome in multiple areas. She is documented getting 50 Norco each month.      ____________________________________________   FINAL CLINICAL IMPRESSION(S) / ED DIAGNOSES  Final diagnoses:  Abscess      NEW MEDICATIONS STARTED DURING THIS  VISIT:  Discharge Medication List as of 03/26/2017 11:11 AM    START taking these medications   Details  clindamycin (CLEOCIN) 150 MG capsule Take 2 capsules qid until finished, Print         Note:  This document was prepared using Dragon voice recognition software and may include unintentional dictation errors.  Johnn Hai, PA-C 03/26/17 1251    Eula Listen, MD 03/26/17 [phone]

## 2018-06-16 ENCOUNTER — Emergency Department: Payer: Medicare Other

## 2018-06-16 ENCOUNTER — Other Ambulatory Visit: Payer: Self-pay

## 2018-06-16 ENCOUNTER — Emergency Department
Admission: EM | Admit: 2018-06-16 | Discharge: 2018-06-17 | Disposition: A | Discharge status: Home / Self Care | Payer: Medicare Other | Attending: Emergency Medicine | Admitting: Emergency Medicine

## 2018-06-16 ENCOUNTER — Encounter: Payer: Self-pay | Admitting: Emergency Medicine

## 2018-06-16 DIAGNOSIS — Z96652 Presence of left artificial knee joint: Secondary | ICD-10-CM | POA: Diagnosis not present

## 2018-06-16 DIAGNOSIS — Z9884 Bariatric surgery status: Secondary | ICD-10-CM | POA: Diagnosis not present

## 2018-06-16 DIAGNOSIS — F329 Major depressive disorder, single episode, unspecified: Secondary | ICD-10-CM | POA: Insufficient documentation

## 2018-06-16 DIAGNOSIS — Z79899 Other long term (current) drug therapy: Secondary | ICD-10-CM | POA: Insufficient documentation

## 2018-06-16 DIAGNOSIS — R1013 Epigastric pain: Secondary | ICD-10-CM | POA: Insufficient documentation

## 2018-06-16 DIAGNOSIS — E109 Type 1 diabetes mellitus without complications: Secondary | ICD-10-CM | POA: Diagnosis not present

## 2018-06-16 DIAGNOSIS — I10 Essential (primary) hypertension: Secondary | ICD-10-CM | POA: Diagnosis not present

## 2018-06-16 DIAGNOSIS — F17210 Nicotine dependence, cigarettes, uncomplicated: Secondary | ICD-10-CM | POA: Diagnosis not present

## 2018-06-16 DIAGNOSIS — Z9049 Acquired absence of other specified parts of digestive tract: Secondary | ICD-10-CM | POA: Insufficient documentation

## 2018-06-16 DIAGNOSIS — Z794 Long term (current) use of insulin: Secondary | ICD-10-CM | POA: Diagnosis not present

## 2018-06-16 NOTE — ED Triage Notes (Signed)
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

## 2018-06-17 LAB — BASIC METABOLIC PANEL
ANION GAP: 5 (ref 5–15)
BUN: 11 mg/dL (ref 6–20)
CALCIUM: 7.9 mg/dL — AB (ref 8.9–10.3)
CO2: 21 mmol/L — ABNORMAL LOW (ref 22–32)
Chloride: 109 mmol/L (ref 98–111)
Creatinine, Ser: 0.85 mg/dL (ref 0.44–1.00)
Glucose, Bld: 140 mg/dL — ABNORMAL HIGH (ref 70–99)
Potassium: 4.5 mmol/L (ref 3.5–5.1)
Sodium: 135 mmol/L (ref 135–145)

## 2018-06-17 LAB — TROPONIN I: Troponin I: <0.03 ng/mL (ref <0.03)

## 2018-06-17 LAB — CBC
HCT: 41 % (ref 35.0–47.0)
HEMOGLOBIN: 13.3 g/dL (ref 12.0–16.0)
MCH: 25.4 pg — ABNORMAL LOW (ref 26.0–34.0)
MCHC: 32.5 g/dL (ref 32.0–36.0)
MCV: 78.2 fL — ABNORMAL LOW (ref 80.0–100.0)
Platelets: 216 10*3/uL (ref 150–440)
RBC: 5.24 MIL/uL — AB (ref 3.80–5.20)
RDW: 16.1 % — ABNORMAL HIGH (ref 11.5–14.5)
WBC: 11.7 10*3/uL — ABNORMAL HIGH (ref 3.6–11.0)

## 2018-06-17 MED ORDER — GI COCKTAIL ~~LOC~~
30.0000 mL | Freq: Once | ORAL | Status: AC
  Administered 2018-06-17: 30 mL via ORAL

## 2018-06-17 MED ORDER — PANTOPRAZOLE SODIUM 20 MG PO TBEC: 20.0000 mg | DELAYED_RELEASE_TABLET | Freq: Two times a day (BID) | ORAL | 0 refills | Status: AC

## 2018-06-17 MED ORDER — PANTOPRAZOLE SODIUM 20 MG PO TBEC
20.0000 mg | DELAYED_RELEASE_TABLET | Freq: Once | ORAL | Status: DC
  Filled 2018-06-17: qty 1

## 2018-06-17 NOTE — ED Provider Notes (Addendum)
ED ECG REPORT I, Alma N BROWN, the attending physician, personally viewed and interpreted this ECG.   Date: 06/17/2018  EKG Time: 11:10 PM  Rate: 65  Rhythm: Normal sinus rhythm  Axis: Normal  Intervals: Normal  ST&T Change: None    Gregor Hams, MD 06/17/18 0117    Gregor Hams, MD 06/18/18 [phone]

## 2018-06-17 NOTE — ED Provider Notes (Signed)
[facility] Emergency Department Provider Note  ____________________________________________   None    (approximate)  I have reviewed the triage vital signs and the nursing notes.   HISTORY  Chief Complaint Chest Pain   HPI Brittany Cummings is a 52 y.o. female who presents to the emergency department for treatment and evaluation of epigastric pain that radiates into the left chest wall.  Patient states that she does have a history of ulcers, but has not had any problems over the past couple years.  She takes Carafate for symptoms which has not helped.  Patient does states that she has had an increase in stress over the past couple of weeks since her husband passed away unexpectedly.  Past Medical History:  Diagnosis Date  . Arthritis   . Cervical disc disease 02/08/2014  . Depression   . Diabetes mellitus without complication ([place])   . Diabetic neuropathy ([place])   . GERD (gastroesophageal reflux disease)   . Hypertension   . Multiple gastric ulcers   . Rib fracture    right side  . Seasonal allergies   . Spinal headache    migraines  . Spondylolisthesis   . Spondylolisthesis of lumbar region 09/15/2015    Patient Active Problem List   Diagnosis Date Noted  . Severe recurrent major depression without psychotic features ([place]) 02/12/2016  . Suicidal ideation 02/12/2016  . Intentional benzodiazepine overdose ([place]) 02/12/2016  . Lumbar foraminal stenosis (Severe) (L4-5) (Left) 01/11/2016  .  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 Cervical herniated disc (C5-6 and C6-7) 01/11/2016  . Thoracic disc herniation (T1-2) (Left) 01/11/2016  . Pain management 01/11/2016  . Adenomatous polyp 12/13/2015  . Glaucoma 12/13/2015  . Headache, migraine 12/13/2015  . Chronic pain 12/13/2015  . Chronic pain syndrome 12/13/2015  . Long term prescription opiate use 12/13/2015  . Chronic abdominal pain (epigastric) 12/13/2015  .  Cervical spondylosis 12/13/2015  . Lumbar spondylosis 12/13/2015  . 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) 12/13/2015  . Lumbar facet hypertrophy (Severe at L4-5) 12/13/2015  . Chronic neck pain (Bilateral) (L>R) 12/13/2015  . Cervicogenic headache (Left) 12/13/2015  . Occipital neuralgia (Left) 12/13/2015  . Chronic shoulder pain (Bilateral) (R>L) 12/13/2015  . Chronic cervical radicular pain (Bilateral) (L>R) 12/13/2015  . Chronic low back pain (Location of Primary Source of Pain) (Bilateral) (R>L) 12/13/2015  . Lumbar facet syndrome (Location of Primary Source of Pain) (Bilateral) (R>L) 12/13/2015  . Chronic knee pain (Location of Secondary source of pain) (Bilateral) (R>L) 12/13/2015  . Chronic lumbar radicular pain (Left) 12/13/2015  . Lumbosacral radiculopathy at L4 (Left) 12/13/2015  . Chronic lower extremity pain (Left) 12/13/2015  . Posture arthritis of knee (Location of Secondary source of pain) (Bilateral) (R>L) 12/13/2015  . Chronic foot pain (Bilateral) (L>R) 12/13/2015  . Chronic hip pain (Bilateral) (R>L) 12/13/2015  . Acne inversa 10/18/2014  . Type 1 diabetes mellitus ([place]) 02/08/2014  . Tobacco use disorder 02/08/2014    Past Surgical History:  Procedure Laterality Date  . abdominal abscess excision    . APPENDECTOMY    . BACK SURGERY    . BREAST REDUCTION SURGERY    . CARPAL TUNNEL RELEASE     right   . CATARACT EXTRACTION     right  . CHOLECYSTECTOMY    . COLONOSCOPY W/ BIOPSIES AND POLYPECTOMY    .  DIAGNOSTIC LAPAROSCOPY     exploratory lap and LOA  . GASTRIC BYPASS    . HERNIA REPAIR    . JOINT REPLACEMENT     left knee  . KNEE ARTHROSCOPY     Left  . SPINAL FUSION  09-15-2015   L4-5  . WISDOM TOOTH EXTRACTION      Prior to Admission medications   Medication Sig Start Date End Date Taking? Authorizing Provider  azelastine  (ASTELIN) 0.1 % nasal spray Place 1 spray into both nostrils 2 (two) times daily as needed for rhinitis.     [provider]  Calcium Carb-Cholecalciferol (CALCIUM 600/VITAMIN D3) 600-800 MG-UNIT TABS Take 1 tablet by mouth daily.     [provider]  clindamycin (CLEOCIN) 150 MG capsule Take 2 capsules qid until finished 03/26/17   Letitia Neri L, PA-C  cyclobenzaprine (FLEXERIL) 10 MG tablet Take 1 tablet (10 mg total) by mouth 3 (three) times daily as needed for muscle spasms. 09/16/15   Kritzer, Louie Casa, MD  diclofenac sodium (VOLTAREN) 1 % GEL Apply 2 g topically 4 (four) times daily as needed (for pain). Pt applies to her back.    [provider]  DULoxetine (CYMBALTA) 60 MG capsule Take 60 mg by mouth 2 (two) times daily.    [provider]  estradiol-norethindrone (MIMVEY) 1-0.5 MG tablet Take 1 tablet by mouth daily.     [provider]  fluticasone (FLONASE) 50 MCG/ACT nasal spray Place 2 sprays into both nostrils daily.    [provider]  gabapentin (NEURONTIN) 300 MG capsule Take 300 mg by mouth at bedtime as needed (for pain).     [provider]  HYDROcodone-acetaminophen (NORCO) 7.5-325 MG tablet Take 1 tablet by mouth 3 (three) times daily as needed for moderate pain.    [provider]  insulin NPH Human (HUMULIN N,NOVOLIN N) 100 UNIT/ML injection Inject 0.15 mLs (15 Units total) into the skin 2 (two) times daily at 8 am and 10 pm. 02/13/16   Loletha Grayer, MD  Multiple Vitamin (MULTIVITAMIN WITH MINERALS) TABS tablet Take 1 tablet by mouth daily.    [provider]  nicotine (NICODERM CQ - DOSED IN MG/24 HOURS) 21 mg/24hr patch Place 1 patch (21 mg total) onto the skin daily. 02/13/16   Loletha Grayer, MD  pantoprazole (PROTONIX) 20 MG tablet Take 1 tablet (20 mg total) by mouth 2 (two) times daily. 06/17/18 07/17/18  Gregor Hams, MD  pantoprazole (PROTONIX) 40 MG tablet Take 40 mg by mouth daily.      [provider]  QUEtiapine (SEROQUEL) 100 MG tablet Take 100 mg by mouth at bedtime.     [provider]  sucralfate (CARAFATE) 1 G tablet Take 1 g by mouth 4 (four) times daily -  with meals and at bedtime.    [provider]  trolamine salicylate (ASPERCREME) 10 % cream Apply 1 application topically as needed (for pain). Pt applies to her back.    [provider]    Allergies Meperidine  Family History  Problem Relation Age of Onset  . Cancer Mother   . Cancer - Colon Father   . Diabetes Other   . Stroke Other   . Hypertension Other     Social History Social History   Tobacco Use  . Smoking status: Current Every Day Smoker    Packs/day: 1.00    Years: 25.00    Pack years: 25.00    Types: Cigarettes  . Smokeless  tobacco: Never Used  Substance Use Topics  . Alcohol use: No    Alcohol/week: 0.0 standard drinks  . Drug use: No    Review of Systems  Constitutional: No fever/chills Eyes: No visual changes. ENT: No sore throat. Cardiovascular: Positive for chest pain. Respiratory: Denies shortness of breath. Gastrointestinal: Today for epigastric abdominal pain.  No nausea, no vomiting.  No diarrhea.  No constipation. Genitourinary: Negative for dysuria. Musculoskeletal: Negative for back pain. Skin: Negative for rash. Neurological: Negative for headaches, focal weakness or numbness. ____________________________________________   PHYSICAL EXAM:  VITAL SIGNS: ED Triage Vitals  Enc Vitals Group     BP 06/16/18 2305 137/77     Pulse Rate 06/16/18 2305 72     Resp 06/16/18 2305 20     Temp --      Temp Source 06/16/18 2305 Oral     SpO2 06/16/18 2305 99 %     Weight 06/16/18 2306 150 lb (68 kg)     Height 06/16/18 2306 5\' 3"  (1.6 m)     Head Circumference --      Peak Flow --      Pain Score 06/16/18 2306 10     Pain Loc --      Pain Edu? --      Excl. in [place]? --     Constitutional: Alert and oriented. Well appearing and  in no acute distress. Eyes: Conjunctivae are normal. Head: Atraumatic. Nose: No congestion/rhinnorhea. Mouth/Throat: Mucous membranes are moist.  Oropharynx non-erythematous. Neck: No stridor.   Cardiovascular: Normal rate, regular rhythm. Grossly normal heart sounds.  Good peripheral circulation. Respiratory: Normal respiratory effort.  No retractions. Lungs CTAB. Gastrointestinal: Soft and nontender. No distention. No abdominal bruits. No CVA tenderness. Musculoskeletal: No lower extremity tenderness nor edema.  No joint effusions. Neurologic:  Normal speech and language. No gross focal neurologic deficits are appreciated. No gait instability. Skin:  Skin is warm, dry and intact. No rash noted. Psychiatric: Mood and affect are normal. Speech and behavior are normal.  ____________________________________________   LABS (all labs ordered are listed, but only abnormal results are displayed)  Labs Reviewed  BASIC METABOLIC PANEL - Abnormal; Notable for the following components:      Result Value   CO2 21 (*)    Glucose, Bld 140 (*)    Calcium 7.9 (*)    All other components within normal limits  CBC - Abnormal; Notable for the following components:   WBC 11.7 (*)    RBC 5.24 (*)    MCV 78.2 (*)    MCH 25.4 (*)    RDW 16.1 (*)    All other components within normal limits  TROPONIN I  TROPONIN I   ____________________________________________  EKG  ED ECG REPORT I, Yarimar Lavis, FNP-BC personally viewed and interpreted this ECG.   Date: 06/17/2018  EKG Time: 23:10  Rate: 65  Rhythm: normal EKG, normal sinus rhythm  Axis: normal  Intervals:none  ST&T Change: no ST elevation  ____________________________________________  RADIOLOGY  ED MD interpretation: No acute cardiopulmonary abnormality.  Official radiology report(s): Dg Chest 2 View  Result Date: 06/16/2018 CLINICAL DATA:  52 year old female with chest pain. EXAM: CHEST - 2 VIEW COMPARISON:  Chest radiograph  dated 02/11/2016 FINDINGS: There is mild emphysematous changes of the lungs. No focal consolidation, pleural effusion, or pneumothorax. The cardiac silhouette is within normal limits. Lower cervical fixation hardware. No acute osseous pathology. IMPRESSION: No active cardiopulmonary disease. Electronically Signed   By: Anner Crete  M.D.   On: 06/16/2018 23:34    ____________________________________________   PROCEDURES  Procedure(s) performed: None  Procedures  Critical Care performed: No  ____________________________________________   INITIAL IMPRESSION / ASSESSMENT AND PLAN / ED COURSE  As part of my medical decision making, I reviewed the following data within the electronic MEDICAL RECORD NUMBER Notes from prior ED visits   52 year old female presenting to the emergency department for treatment and evaluation of epigastric pain that radiates into the chest wall.  Symptoms and exam are most consistent with reflux or peptic ulcer disease. She will be given a GI cocktail and monitor closely.  Initial troponin is normal.  ----------------------------------------- 01:45 AM on 06/17/2018 -----------------------------------------  Patient care relinquished to Dr. Owens Shark who will follow up on second troponin and make disposition.      ____________________________________________   FINAL CLINICAL IMPRESSION(S) / ED DIAGNOSES  Final diagnoses:  Epigastric pain     ED Discharge Orders         Ordered    pantoprazole (PROTONIX) 20 MG tablet  2 times daily     06/17/18 0454           Note:  This document was prepared using Dragon voice recognition software and may include unintentional dictation errors.    Victorino Dike, FNP 06/17/18 1549    Gregor Hams, MD 06/18/18 [phone]

## 2018-08-05 ENCOUNTER — Emergency Department
Admission: EM | Admit: 2018-08-05 | Discharge: 2018-08-05 | Disposition: A | Discharge status: Home / Self Care | Payer: Medicare Other | Attending: Emergency Medicine | Admitting: Emergency Medicine

## 2018-08-05 ENCOUNTER — Other Ambulatory Visit: Payer: Self-pay

## 2018-08-05 ENCOUNTER — Emergency Department: Payer: Medicare Other

## 2018-08-05 DIAGNOSIS — Y9389 Activity, other specified: Secondary | ICD-10-CM | POA: Diagnosis not present

## 2018-08-05 DIAGNOSIS — I10 Essential (primary) hypertension: Secondary | ICD-10-CM | POA: Diagnosis not present

## 2018-08-05 DIAGNOSIS — F17210 Nicotine dependence, cigarettes, uncomplicated: Secondary | ICD-10-CM | POA: Insufficient documentation

## 2018-08-05 DIAGNOSIS — Z79899 Other long term (current) drug therapy: Secondary | ICD-10-CM | POA: Diagnosis not present

## 2018-08-05 DIAGNOSIS — S299XXA Unspecified injury of thorax, initial encounter: Secondary | ICD-10-CM | POA: Diagnosis present

## 2018-08-05 DIAGNOSIS — S2243XA Multiple fractures of ribs, bilateral, initial encounter for closed fracture: Secondary | ICD-10-CM | POA: Diagnosis not present

## 2018-08-05 DIAGNOSIS — Z794 Long term (current) use of insulin: Secondary | ICD-10-CM | POA: Diagnosis not present

## 2018-08-05 DIAGNOSIS — X509XXA Other and unspecified overexertion or strenuous movements or postures, initial encounter: Secondary | ICD-10-CM | POA: Diagnosis not present

## 2018-08-05 DIAGNOSIS — E119 Type 2 diabetes mellitus without complications: Secondary | ICD-10-CM | POA: Diagnosis not present

## 2018-08-05 DIAGNOSIS — Y999 Unspecified external cause status: Secondary | ICD-10-CM | POA: Diagnosis not present

## 2018-08-05 DIAGNOSIS — Y929 Unspecified place or not applicable: Secondary | ICD-10-CM | POA: Diagnosis not present

## 2018-08-05 DIAGNOSIS — R52 Pain, unspecified: Secondary | ICD-10-CM

## 2018-08-05 MED ORDER — MELOXICAM 15 MG PO TABS: 15.0000 mg | ORAL_TABLET | Freq: Every day | ORAL | 0 refills | Status: AC
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
## 2019-06-03 ENCOUNTER — Ambulatory Visit: Admit: 2019-06-03 | Discharge: 2019-06-04 | Payer: MEDICARE | Attending: Dermatology | Primary: Dermatology

## 2019-06-03 DIAGNOSIS — L404 Guttate psoriasis: Principal | ICD-10-CM

## 2019-06-03 DIAGNOSIS — L400 Psoriasis vulgaris: Secondary | ICD-10-CM

## 2019-06-03 MED ORDER — OTEZLA STARTER 10 MG (4)-20 MG (4)-30 MG(47) TABLETS IN A DOSE PACK
PACK | 0 refills | 0 days | Status: CP
Start: 2019-06-03 — End: ?

## 2020-12-22 ENCOUNTER — Ambulatory Visit
Admit: 2020-12-22 | Discharge: 2020-12-23 | Payer: MEDICARE | Attending: Student in an Organized Health Care Education/Training Program | Primary: Student in an Organized Health Care Education/Training Program

## 2020-12-22 DIAGNOSIS — L400 Psoriasis vulgaris: Principal | ICD-10-CM

## 2020-12-22 DIAGNOSIS — L4050 Arthropathic psoriasis, unspecified: Principal | ICD-10-CM

## 2020-12-22 MED ORDER — DESONIDE 0.05 % TOPICAL OINTMENT
Freq: Two times a day (BID) | TOPICAL | 1 refills | 0.00000 days | Status: CP
Start: 2020-12-22 — End: ?

## 2020-12-22 MED ORDER — CLOBETASOL 0.05 % TOPICAL OINTMENT
OPHTHALMIC | 1 refills | 0.00000 days | Status: CP
Start: 2020-12-22 — End: ?

## 2020-12-22 MED ORDER — TRIAMCINOLONE ACETONIDE 0.1 % TOPICAL OINTMENT
Freq: Two times a day (BID) | TOPICAL | 1 refills | 0.00000 days | Status: CP
Start: 2020-12-22 — End: ?

## 2020-12-22 MED ORDER — FLUOCINONIDE 0.05 % TOPICAL SOLUTION
Freq: Two times a day (BID) | TOPICAL | 1 refills | 0.00000 days | Status: CP
Start: 2020-12-22 — End: 2021-12-22

## 2021-01-20 ENCOUNTER — Other Ambulatory Visit: Admit: 2021-01-20 | Discharge: 2021-01-20 | Payer: MEDICARE

## 2021-01-20 ENCOUNTER — Ambulatory Visit
Admit: 2021-01-20 | Discharge: 2021-01-20 | Payer: MEDICARE | Attending: Student in an Organized Health Care Education/Training Program | Primary: Student in an Organized Health Care Education/Training Program

## 2021-01-20 DIAGNOSIS — Z7289 Other problems related to lifestyle: Principal | ICD-10-CM

## 2021-01-20 DIAGNOSIS — Z79899 Other long term (current) drug therapy: Principal | ICD-10-CM

## 2021-01-20 DIAGNOSIS — Z1159 Encounter for screening for other viral diseases: Principal | ICD-10-CM

## 2021-01-25 DIAGNOSIS — L409 Psoriasis, unspecified: Principal | ICD-10-CM

## 2021-01-25 MED ORDER — HUMIRA PEN CITRATE FREE 40 MG/0.4 ML
SUBCUTANEOUS | 3 refills | 28.00000 days | Status: CP
Start: 2021-01-25 — End: ?

## 2021-01-25 MED ORDER — HUMIRA PEN CITRATE FREE STARTER PACK FOR CROHN'S/UC/HS 3 X 80 MG/0.8 ML
ORAL | 0 refills | 0.00000 days | Status: CP
Start: 2021-01-25 — End: ?

## 2021-01-26 DIAGNOSIS — L409 Psoriasis, unspecified: Principal | ICD-10-CM

## 2021-01-31 NOTE — Unmapped (Signed)
01/31/21 - Attempted to call patient to discuss her blood work results, but she did not answer. Her hepatitis serologies and quant gold are reassuring, as is her ALT, AST, BUN and Cr. Interestingly her WBC was elevated (although she had not signs of infection at the date of the visit) and her absolute eosinophil count was elevated and has been persistently rising over the past couple of years (CBC with diff seen in care everywhere). I would like to have an e-consult with hematology to discuss if this is something that needs to be looked into further or if this is just a reflection of inflammation 2/2 her psoriasis. Will need consent from the patient before placing this consult. Her Humira has been approved, but I would like to have this question answered on whether this blood value is OK before she starts the  medication. LNC

## 2021-01-31 NOTE — Unmapped (Signed)
[facility] Specialty Medication Onboarding    Specialty Medication: Humira starter kit and maintenance  Prior Authorization: Approved   Financial Assistance: No - copay  <$25  Final Copay/Day Supply: $9.85 / 28 days (for both)    Insurance Restrictions: None     Notes to Pharmacist:     The triage team has completed the benefits investigation and has determined that the patient is able to fill this medication at [facility]. Please contact the patient to complete the onboarding or follow up with the prescribing physician as needed.

## 2021-02-01 NOTE — Unmapped (Signed)
Per notes from dermatologist, Humira is being held until patient can have additional workup regarding some lab abnormalities. Will continue to hold medication for now and will check in chart periodically for updates.    Remmy Riffe A. Katrinka Blazing, PharmD, BCPS - Pharmacist   [facility]

## 2021-02-06 DIAGNOSIS — D721 Eosinophilia, unspecified type: Principal | ICD-10-CM

## 2021-02-06 NOTE — Unmapped (Signed)
02/06/21 - Called the patient and discussed her blood work results. We discussed that her white blood cell count was high, and has slowly been trending upwards over time. She said that she knows that it has been high in the past when checked with her PCP, but he has never mentioned it being an issue. I asked her if she would be ok with me doing an e-visit with hematology to ask if this is something that should be worked up further, and that this would likely incur an office visit co-pay. She consents to this. I will place a referral for an e-visit to ask if there is any need for further work up of her elevated white count and rising eosinophil count. If this is not an issue, will give the OK to dispense Humira for the treatment of her psoriasis. LNC

## 2021-02-08 DIAGNOSIS — D72118 Other hypereosinophilic syndrome: Principal | ICD-10-CM

## 2021-02-08 NOTE — Unmapped (Signed)
ID: Mariah Alvarez is a 55 yo with psoriasis and increased eosinophils.     ASSESSMENT:  Mariah Alvarez is referred with increased eosinophils and psoriasis by her dermatologist who is interested in treating her with the anti-TNF therapy adalimumab.     Technically, her eosinophil count does not reach the benchmark for hyperreosinophilia, which is > 1.5 on two occasions one month apart.  The diagnosis of hypereosinophilic syndrome requires hypereosinophilia plus evidence of tissue damage. So the short answer is she does not have HES.     The most likely answer is her eosinophils are reactive to an underlying autoimmune disorder. She was treated for costochondritis in the past and I know that her doctors are entertaining a diagnosis of psoriatic arthritis.  The increase in her eosinophils could be secondary to this process.       The one caveat to the use of adalimumab in this case is the risk of lymphoma.  Hypereosinophilia can be driven by T cell lymphomas. In fact, if we do not find another cause, we often blame HES on low grade lymphomas and treat them with an IL-5 inhibitor.  This is usually a diagnosis of exclusion.  In other words, scanning and bone marrow biopsies are often negative.  We can send a TCR/BCR clonality study though the negative predictive value is low.     Finally, I can not find a skin biopsy result.  She could have a cutaneous T cell lymphoma.  This would explain eosinophilia as well.     RECOMMENDATIONS:   1) Send TCR, BCR clonality studies  2) Skin biopsy to rule out cutaneous T cell lymphoma  3) If negative, I would consider treatment with an alternative agent and avoid adalimumab given the risk of lymphoma.  If positive, she will need to be referred to our CTCL clinic.     HEME HX:   04/23/14: 13.1/15.8/227; Nl differential  11/03/14: 12.0/16.3/222; Eos 84  10/26/15: 10.7/14.25/252; Eos 108; Baso 9  08/20/17: 11.2/13.9/237; Eos 980; Baso 130  05/25/19: 10.1/13.9/205; Eos 750; Baso: 110   05/26/20: 9.6/12.8/195; Eos 920; Baso: 130  01/20/21: 18.4/14.3/249; Eos 1100; Mono 1200    PMHx:  ?? DM:  ?? A1c: < 7.0 since 2018  ?? Costochondritis (2018)  ?? Txed with steroids/NSAIDs  ?? Psoriatic Arthritis   ?? 5% noted in 05/2019; not responsive to clobetasol; attempted to get [facility]   ?? Adenomatous polyp   ?? Cervical disc disease 02/08/2014   ?? Depression   ?? GERD (gastroesophageal reflux disease)   ?? HTN (hypertension) 02/08/2014   ?? Lumbar disc disease 03/22/2014   ?? Migraine headache   ?? Neuropathy   ?? Scoliosis   ?? TIA (transient ischemic attack) 02/08/2014      PSHx  ??? APPENDECTOMY   ??? ARTHRODESIS ANTERIOR CERVICLE SPINE N/A 04/25/2016   ??? CARPAL TUNNEL RELEASE   ??? Cataract surgery 04/12/2009, 04/10/2005   ??? COLONOSCOPY 09/02/2017   ??? COLPOSCOPY   ??? EGD 06/25/2012   09/29/2009, 04/12/2009, 04/10/2005, 05/28/1996   ??? INSERTION STRUCTURAL BONE ALLOGRAFT FOR SPINE SURGERY N/A 04/25/2016   ??? INSTRUMENTATION ANTERIOR SPINE 2 TO 3 VERTEBRAL   ??? JOINT REPLACEMENT   ??? LAMINECTOMY LUMBAR SPINE 1995, 2016   ??? LAPAROSCOPIC CHOLECYSTECTOMY W/ CHOLANGIOGRAPHY 2015   ??? LAPAROSCOPIC GASTRIC BYPASS 2014   ??? LYSIS ADHESIONS 04/24/2014   [facility]- with hernia repair   ??? REDUCTION MAMMAPLASTY   ??? REPLACEMENT TOTAL KNEE Left 2010

## 2021-02-16 NOTE — Unmapped (Signed)
Specialty Medication(s): Humira    Mariah Alvarez has been dis-enrolled from the [facility] specialty pharmacy services due to decision by providers to not use TNF-inhibitors (received message from dermatologist). If new order is received for alternative biologic, will re-enroll in our services..    Additional information provided to the patient: NA    Mariah Alvarez A Mariah Alvarez [facility] Specialty Pharmacist

## 2021-07-04 ENCOUNTER — Other Ambulatory Visit: Payer: Self-pay

## 2021-07-04 ENCOUNTER — Emergency Department: Payer: Medicare Other

## 2021-07-04 ENCOUNTER — Emergency Department
Admission: EM | Admit: 2021-07-04 | Discharge: 2021-07-04 | Disposition: A | Discharge status: Home / Self Care | Payer: Medicare Other | Attending: Emergency Medicine | Admitting: Emergency Medicine

## 2021-07-04 DIAGNOSIS — Z9884 Bariatric surgery status: Secondary | ICD-10-CM | POA: Insufficient documentation

## 2021-07-04 DIAGNOSIS — K429 Umbilical hernia without obstruction or gangrene: Secondary | ICD-10-CM | POA: Insufficient documentation

## 2021-07-04 DIAGNOSIS — F17210 Nicotine dependence, cigarettes, uncomplicated: Secondary | ICD-10-CM | POA: Insufficient documentation

## 2021-07-04 DIAGNOSIS — Z794 Long term (current) use of insulin: Secondary | ICD-10-CM | POA: Diagnosis not present

## 2021-07-04 DIAGNOSIS — E109 Type 1 diabetes mellitus without complications: Secondary | ICD-10-CM | POA: Insufficient documentation

## 2021-07-04 DIAGNOSIS — K760 Fatty (change of) liver, not elsewhere classified: Secondary | ICD-10-CM | POA: Diagnosis not present

## 2021-07-04 DIAGNOSIS — K2970 Gastritis, unspecified, without bleeding: Secondary | ICD-10-CM | POA: Diagnosis not present

## 2021-07-04 DIAGNOSIS — Z96652 Presence of left artificial knee joint: Secondary | ICD-10-CM | POA: Insufficient documentation

## 2021-07-04 DIAGNOSIS — R109 Unspecified abdominal pain: Secondary | ICD-10-CM | POA: Diagnosis present

## 2021-07-04 LAB — COMPREHENSIVE METABOLIC PANEL
ALT: 16 U/L (ref 0–44)
AST: 23 U/L (ref 15–41)
Albumin: 3.5 g/dL (ref 3.5–5.0)
Alkaline Phosphatase: 76 U/L (ref 38–126)
Anion gap: 9 (ref 5–15)
BUN: 8 mg/dL (ref 6–20)
CO2: 26 mmol/L (ref 22–32)
Calcium: 8.7 mg/dL — ABNORMAL LOW (ref 8.9–10.3)
Chloride: 102 mmol/L (ref 98–111)
Creatinine, Ser: 0.8 mg/dL (ref 0.44–1.00)
GFR, Estimated: >60 mL/min (ref >60)
Glucose, Bld: 134 mg/dL — ABNORMAL HIGH (ref 70–99)
Potassium: 4.1 mmol/L (ref 3.5–5.1)
Sodium: 137 mmol/L (ref 135–145)
Total Bilirubin: 0.7 mg/dL (ref 0.3–1.2)
Total Protein: 6.8 g/dL (ref 6.5–8.1)

## 2021-07-04 LAB — CBC
HCT: 46.4 % — ABNORMAL HIGH (ref 36.0–46.0)
Hemoglobin: 15.4 g/dL — ABNORMAL HIGH (ref 12.0–15.0)
MCH: 26.9 pg (ref 26.0–34.0)
MCHC: 33.2 g/dL (ref 30.0–36.0)
MCV: 81.1 fL (ref 80.0–100.0)
Platelets: 197 10*3/uL (ref 150–400)
RBC: 5.72 MIL/uL — ABNORMAL HIGH (ref 3.87–5.11)
RDW: 16.4 % — ABNORMAL HIGH (ref 11.5–15.5)
WBC: 10.4 10*3/uL (ref 4.0–10.5)
nRBC: 0 % (ref 0.0–0.2)

## 2021-07-04 LAB — LIPASE, BLOOD: Lipase: 27 U/L (ref 11–51)

## 2021-07-04 MED ORDER — SODIUM CHLORIDE 0.9 % IV BOLUS
1000.0000 mL | Freq: Once | INTRAVENOUS | Status: AC
  Administered 2021-07-04: 1000 mL via INTRAVENOUS

## 2021-07-04 MED ORDER — METOCLOPRAMIDE HCL 10 MG PO TABS: 10.0000 mg | ORAL_TABLET | Freq: Four times a day (QID) | ORAL | 0 refills | Status: AC | PRN

## 2021-07-04 MED ORDER — IOHEXOL 350 MG/ML SOLN
75.0000 mL | Freq: Once | INTRAVENOUS | Status: AC | PRN
  Administered 2021-07-04: 75 mL via INTRAVENOUS
  Filled 2021-07-04: qty 75

## 2021-07-04 MED ORDER — FAMOTIDINE 20 MG IN NS 100 ML IVPB
20.0000 mg | Freq: Once | INTRAVENOUS | Status: AC
  Administered 2021-07-04: 20 mg via INTRAVENOUS
  Filled 2021-07-04: qty 100

## 2021-07-04 MED ORDER — ONDANSETRON 4 MG PO TBDP
4.0000 mg | ORAL_TABLET | Freq: Once | ORAL | Status: AC
  Administered 2021-07-04: 4 mg via ORAL
  Filled 2021-07-04: qty 1

## 2021-07-04 MED ORDER — MORPHINE SULFATE (PF) 4 MG/ML IV SOLN
4.0000 mg | Freq: Once | INTRAVENOUS | Status: AC
  Administered 2021-07-04: 4 mg via INTRAVENOUS
  Filled 2021-07-04: qty 1

## 2021-07-04 MED ORDER — OXYCODONE-ACETAMINOPHEN 5-325 MG PO TABS
1.0000 | ORAL_TABLET | Freq: Once | ORAL | Status: AC
Start: 2021-07-04 — End: 2021-07-04
  Administered 2021-07-04: 1 via ORAL
  Filled 2021-07-04: qty 1

## 2021-07-04 MED ORDER — PANTOPRAZOLE SODIUM 40 MG IV SOLR
40.0000 mg | Freq: Once | INTRAVENOUS | Status: AC
  Administered 2021-07-04: 40 mg via INTRAVENOUS
  Filled 2021-07-04: qty 40

## 2021-07-04 MED ORDER — METOCLOPRAMIDE HCL 5 MG/ML IJ SOLN
10.0000 mg | Freq: Once | INTRAMUSCULAR | Status: AC
  Administered 2021-07-04: 10 mg via INTRAVENOUS
  Filled 2021-07-04: qty 2

## 2021-07-04 MED ORDER — FAMOTIDINE 20 MG PO TABS: 20.0000 mg | ORAL_TABLET | Freq: Two times a day (BID) | ORAL | 0 refills | Status: AC

## 2021-07-04 NOTE — ED Provider Notes (Signed)
[facility] Emergency Department Provider Note  ____________________________________________  Time seen: Approximately 7:45 PM  I have reviewed the triage vital signs and the nursing notes.   HISTORY  Chief Complaint Abdominal Pain    HPI CHARMION Cummings is a 55 y.o. female with a history of diabetes hypertension and Roux-en-Y gastric bypass who comes ED complaining of diffuse upper abdominal pain and vomiting for the last 2 or 3 days.  Waxing and Cummings, worse with eating, no alleviating factors.  Nonradiating.  She reports a history of GERD, takes Carafate whenever she starts having symptoms like this which usually helps.  This time around she has been taking for the last 2 days without relief.  Past Medical History:  Diagnosis Date   Arthritis    Cervical disc disease 02/08/2014   Depression    Diabetes mellitus without complication (HCC)    Diabetic neuropathy (HCC)    GERD (gastroesophageal reflux disease)    Hypertension    Multiple gastric ulcers    Rib fracture    right side   Seasonal allergies    Spinal headache    migraines   Spondylolisthesis    Spondylolisthesis of lumbar region 09/15/2015     Patient Active Problem List   Diagnosis Date Noted   Severe recurrent major depression without psychotic features ([place]) 02/12/2016   Suicidal ideation 02/12/2016   Intentional benzodiazepine overdose ([place] 02/12/2016   Lumbar foraminal stenosis (Severe) (L4-5) (Left) 01/11/2016    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) 01/11/2016   Cervical herniated disc (C5-6 and C6-7) 01/11/2016   Thoracic disc herniation (T1-2) (Left) 01/11/2016   Pain management 01/11/2016   Adenomatous polyp 12/13/2015   Glaucoma 12/13/2015   Headache, migraine 12/13/2015   Chronic pain 12/13/2015   Chronic pain syndrome 12/13/2015   Long term prescription opiate use 12/13/2015   Chronic abdominal pain (epigastric) 12/13/2015   Cervical  spondylosis 12/13/2015   Lumbar spondylosis 12/13/2015   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) 12/13/2015   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) 12/13/2015   Lumbar facet hypertrophy (Severe at L4-5) 12/13/2015   Chronic neck pain (Bilateral) (L>R) 12/13/2015   Cervicogenic headache (Left) 12/13/2015   Occipital neuralgia (Left) 12/13/2015   Chronic shoulder pain (Bilateral) (R>L) 12/13/2015   Chronic cervical radicular pain (Bilateral) (L>R) 12/13/2015   Chronic low back pain (Location of Primary Source of Pain) (Bilateral) (R>L) 12/13/2015   Lumbar facet syndrome (Location of Primary Source of Pain) (Bilateral) (R>L) 12/13/2015   Chronic knee pain (Location of Secondary source of pain) (Bilateral) (R>L) 12/13/2015   Chronic lumbar radicular pain (Left) 12/13/2015   Lumbosacral radiculopathy at L4 (Left) 12/13/2015   Chronic lower extremity pain (Left) 12/13/2015   Posture arthritis of knee (Location of Secondary source of pain) (Bilateral) (R>L) 12/13/2015   Chronic foot pain (Bilateral) (L>R) 12/13/2015   Chronic hip pain (Bilateral) (R>L) 12/13/2015   Acne inversa 10/18/2014   Type 1 diabetes mellitus ([place]) 02/08/2014   Tobacco use disorder 02/08/2014     Past Surgical History:  Procedure Laterality Date   abdominal abscess excision     APPENDECTOMY     BACK SURGERY     BREAST REDUCTION SURGERY     CARPAL TUNNEL RELEASE     right    CATARACT EXTRACTION     right   CHOLECYSTECTOMY     COLONOSCOPY W/ BIOPSIES AND POLYPECTOMY  DIAGNOSTIC LAPAROSCOPY     exploratory lap and LOA   GASTRIC BYPASS     HERNIA REPAIR     JOINT REPLACEMENT     left knee   KNEE ARTHROSCOPY     Left   SPINAL FUSION  09-15-2015   L4-5   WISDOM TOOTH EXTRACTION       Prior to Admission medications   Medication Sig Start Date End Date Taking? Authorizing Provider  famotidine (PEPCID) 20 MG tablet Take 1 tablet (20 mg  total) by mouth 2 (two) times daily. 07/04/21  Yes Carrie Mew, MD  metoCLOPramide (REGLAN) 10 MG tablet Take 1 tablet (10 mg total) by mouth every 6 (six) hours as needed. 07/04/21  Yes Carrie Mew, MD  azelastine (ASTELIN) 0.1 % nasal spray Place 1 spray into both nostrils 2 (two) times daily as needed for rhinitis.     [provider]  Calcium Carb-Cholecalciferol (CALCIUM 600/VITAMIN D3) 600-800 MG-UNIT TABS Take 1 tablet by mouth daily.     [provider]  clindamycin (CLEOCIN) 150 MG capsule Take 2 capsules qid until finished 03/26/17   Letitia Neri L, PA-C  cyclobenzaprine (FLEXERIL) 10 MG tablet Take 1 tablet (10 mg total) by mouth 3 (three) times daily as needed for muscle spasms. 09/16/15   Kritzer, Louie Casa, MD  diclofenac sodium (VOLTAREN) 1 % GEL Apply 2 g topically 4 (four) times daily as needed (for pain). Pt applies to her back.    [provider]  DULoxetine (CYMBALTA) 60 MG capsule Take 60 mg by mouth 2 (two) times daily.    [provider]  estradiol-norethindrone (MIMVEY) 1-0.5 MG tablet Take 1 tablet by mouth daily.     [provider]  fluticasone (FLONASE) 50 MCG/ACT nasal spray Place 2 sprays into both nostrils daily.    [provider]  gabapentin (NEURONTIN) 300 MG capsule Take 300 mg by mouth at bedtime as needed (for pain).     [provider]  HYDROcodone-acetaminophen (NORCO) 7.5-325 MG tablet Take 1 tablet by mouth 3 (three) times daily as needed for moderate pain.    [provider]  insulin NPH Human (HUMULIN N,NOVOLIN N) 100 UNIT/ML injection Inject 0.15 mLs (15 Units total) into the skin 2 (two) times daily at 8 am and 10 pm. 02/13/16   Loletha Grayer, MD  meloxicam (MOBIC) 15 MG tablet Take 1 tablet (15 mg total) by mouth daily. 08/05/18   Cuthriell, Charline Bills, PA-C  Multiple Vitamin (MULTIVITAMIN WITH MINERALS) TABS tablet Take 1 tablet by mouth daily.    [provider]   nicotine (NICODERM CQ - DOSED IN MG/24 HOURS) 21 mg/24hr patch Place 1 patch (21 mg total) onto the skin daily. 02/13/16   Loletha Grayer, MD  pantoprazole (PROTONIX) 20 MG tablet Take 1 tablet (20 mg total) by mouth 2 (two) times daily. 06/17/18 07/17/18  Gregor Hams, MD  pantoprazole (PROTONIX) 40 MG tablet Take 40 mg by mouth daily.     [provider]  QUEtiapine (SEROQUEL) 100 MG tablet Take 100 mg by mouth at bedtime.     [provider]  sucralfate (CARAFATE) 1 G tablet Take 1 g by mouth 4 (four) times daily -  with meals and at bedtime.    [provider]  trolamine salicylate (ASPERCREME) 10 % cream Apply 1 application topically as needed (for pain). Pt applies to her back.    [provider]     Allergies Meperidine   Family History  Problem Relation Age of Onset   Cancer Mother    Cancer - Colon Father    Diabetes Other    Stroke Other    Hypertension Other     Social History Social History   Tobacco Use   Smoking status: Every Day    Packs/day: 1.00    Years: 25.00    Pack years: 25.00    Types: Cigarettes   Smokeless tobacco: Never  Substance Use Topics   Alcohol use: No    Alcohol/week: 0.0 standard drinks   Drug use: No    Review of Systems  Constitutional:   No fever or chills.  ENT:   No sore throat. No rhinorrhea. Cardiovascular:   No chest pain or syncope. Respiratory:   No dyspnea or cough. Gastrointestinal:   Positive for abdominal pain and vomiting.  Musculoskeletal:   Negative for focal pain or swelling All other systems reviewed and are negative except as documented above in ROS and HPI.  ____________________________________________   PHYSICAL EXAM:  VITAL SIGNS: ED Triage Vitals  Enc Vitals Group     BP 07/04/21 1404 (!) 156/73     Pulse Rate 07/04/21 1404 60     Resp 07/04/21 1404 18     Temp 07/04/21 1404 99.3 F (37.4 C)     Temp src --      SpO2 07/04/21 1404 100 %     Weight --       Height --      Head Circumference --      Peak Flow --      Pain Score 07/04/21 1400 10     Pain Loc --      Pain Edu? --      Excl. in [place]? --     Vital signs reviewed, nursing assessments reviewed.   Constitutional:   Alert and oriented. Non-toxic appearance. Eyes:   Conjunctivae are normal. EOMI. PERRL. ENT      Head:   Normocephalic and atraumatic.      Nose:   Wearing a mask.      Mouth/Throat:   Wearing a mask.      Neck:   No meningismus. Full ROM. Hematological/Lymphatic/Immunilogical:   No cervical lymphadenopathy. Cardiovascular:   RRR. Symmetric bilateral radial and DP pulses.  No murmurs. Cap refill less than 2 seconds. Respiratory:   Normal respiratory effort without tachypnea/retractions. Breath sounds are clear and equal bilaterally. No wheezes/rales/rhonchi. Gastrointestinal:   Soft with left upper quadrant tenderness.  Nondistended.  No rebound, rigidity, or guarding. Genitourinary:   deferred Musculoskeletal:   Normal range of motion in all extremities. No joint effusions.  No lower extremity tenderness.  No edema. Neurologic:   Normal speech and language.  Motor grossly intact. No acute focal neurologic deficits are appreciated.  Skin:    Skin is warm, dry and intact. No rash noted.  No petechiae, purpura, or bullae.  ____________________________________________    LABS (pertinent positives/negatives) (all labs ordered are listed, but only abnormal results are displayed) Labs Reviewed  COMPREHENSIVE METABOLIC PANEL - Abnormal; Notable for the following components:      Result Value   Glucose, Bld 134 (*)    Calcium 8.7 (*)    All other components within normal limits  CBC - Abnormal; Notable for the following components:   RBC 5.72 (*)    Hemoglobin 15.4 (*)    HCT 46.4 (*)    RDW 16.4 (*)    All other components within normal limits  LIPASE, BLOOD  URINALYSIS, COMPLETE (UACMP) WITH MICROSCOPIC    ____________________________________________   EKG    ____________________________________________    RADIOLOGY  CT ABDOMEN PELVIS W CONTRAST  Result Date: 07/04/2021 CLINICAL DATA:  Abdominal pain, hernia suspected Abdominal pain, acute, nonlocalized Abdominal pain.  History of gastric bypass. EXAM: CT ABDOMEN AND PELVIS WITH CONTRAST TECHNIQUE: Multidetector CT imaging of the abdomen and pelvis was performed using the standard protocol following bolus administration of intravenous contrast. CONTRAST:  57mL OMNIPAQUE IOHEXOL 350 MG/ML SOLN COMPARISON:  CT 12/28/2015 FINDINGS: Lower chest: Focal airspace disease or pleural effusion. The heart is normal in size. Hepatobiliary: Diffuse hepatic steatosis. Elongated right lobe of the liver extending into the upper pelvis. No focal hepatic lesion. Cholecystectomy. No biliary dilatation. Pancreas: No ductal dilatation or inflammation. Occasional calcifications in the distal body and tail. No evidence of focal lesion. Spleen: Normal in size without focal abnormality. Adrenals/Urinary Tract: Normal adrenal glands. No hydronephrosis or perinephric edema. Homogeneous renal enhancement with symmetric excretion on delayed phase imaging. Small cyst in the upper medial left kidney. No visualized calculi. Urinary bladder is physiologically distended without wall thickening. Stomach/Bowel: Gastric bypass anatomy. There is a small amount of fluid in the excluded gastric remnant. Mild hyperemia of the excluded gastric remnant. No perigastric fat stranding. Roux limb is nondilated. Jejunal anastomosis is minimally patulous. There is no small bowel obstruction, inflammation, or abnormal distention. Small volume of stool in the colon. Appendectomy. Vascular/Lymphatic: Aortic atherosclerosis. No aortic aneurysm. Patent portal vein. No acute vascular findings no abdominopelvic adenopathy. Reproductive: Quiescent appearance of the uterus.  No adnexal mass. Other: No free  air or ascites. Tiny fat containing umbilical hernia. Small bowel loop approaches the umbilical hernia without wall thickening or inflammation. Musculoskeletal: Posterior L5-S1 lumbar fusion. Mild right hip osteoarthritis. There are no acute or suspicious osseous abnormalities. IMPRESSION: 1. Gastric bypass anatomy. There is mild hyperemia and a small amount of fluid in the excluded gastric remnant. Findings may be due to peptic ulcer disease or gastrogastric fistula. 2. No bowel obstruction or evidence of internal hernia. 3. Tiny fat containing umbilical hernia. Small bowel loop approaches the umbilical hernia without wall thickening or inflammation. 4. Hepatic steatosis. Aortic Atherosclerosis (ICD10-I70.0). Electronically Signed   By: Keith Rake M.D.   On: 07/04/2021 19:21    ____________________________________________   PROCEDURES Procedures  ____________________________________________  DIFFERENTIAL DIAGNOSIS   Gastritis, bowel obstruction, perforation, diverticulitis, internal hernia  CLINICAL IMPRESSION / ASSESSMENT AND PLAN / ED COURSE  Medications ordered in the ED: Medications  ondansetron (ZOFRAN-ODT) disintegrating tablet 4 mg (4 mg Oral Given 07/04/21 1427)  oxyCODONE-acetaminophen (PERCOCET/ROXICET) 5-325 MG per tablet 1 tablet (1 tablet Oral Given 07/04/21 1427)  morphine 4 MG/ML injection 4 mg (4 mg Intravenous Given 07/04/21 1857)  metoCLOPramide (REGLAN) injection 10 mg (10 mg Intravenous Given 07/04/21 1857)  pantoprazole (PROTONIX) injection 40 mg (40 mg Intravenous Given 07/04/21 1857)  famotidine (PEPCID) IVPB 20 mg in NS 100 mL IVPB (20 mg Intravenous New Bag/Given 07/04/21 1857)  sodium chloride 0.9 % bolus 1,000 mL (1,000 mLs Intravenous New Bag/Given 07/04/21 1856)  iohexol (OMNIPAQUE) 350 MG/ML injection 75 mL (75 mLs Intravenous Contrast Given 07/04/21 1834)    Pertinent labs & imaging results that were available during my care of the patient were reviewed by me  and considered in my medical decision making (see chart for details).  BROOKELIN FELBER was evaluated in Emergency Department on 07/04/2021 for the symptoms described in the history of present illness. She  was evaluated in the context of the global COVID-19 pandemic, which necessitated consideration that the patient might be at risk for infection with the SARS-CoV-2 virus that causes COVID-19. Institutional protocols and algorithms that pertain to the evaluation of patients at risk for COVID-19 are in a state of rapid change based on information released by regulatory bodies including the CDC and federal and state organizations. These policies and algorithms were followed during the patient's care in the ED.   Patient presents with upper abdominal pain and tenderness in the setting of Roux-en-Y gastric bypass.  She has tried usual GERD measures at home without relief this time.  CT scan obtained which is unremarkable except for some evidence of gastritis and suspicion of peptic ulcer disease.  Otherwise unremarkable without acute findings.  Vital signs unremarkable, feeling better after antacids, stable for discharge.  Recommend close follow-up with PCP/GI for ongoing evaluation of ulcers.      ____________________________________________   FINAL CLINICAL IMPRESSION(S) / ED DIAGNOSES    Final diagnoses:  Gastritis without bleeding, unspecified chronicity, unspecified gastritis type  History of Roux-en-Y gastric bypass     ED Discharge Orders          Ordered    famotidine (PEPCID) 20 MG tablet  2 times daily        07/04/21 1945    metoCLOPramide (REGLAN) 10 MG tablet  Every 6 hours PRN        07/04/21 1945            Portions of this note were generated with dragon dictation software. Dictation errors may occur despite best attempts at proofreading.    Carrie Mew, MD 07/04/21 1949

## 2021-07-04 NOTE — ED Triage Notes (Signed)
Pt comes with c/o belly pain and vomiting for few days.

## 2021-07-04 NOTE — ED Notes (Signed)
Patient transported to CT 

## 2021-07-04 NOTE — ED Provider Notes (Signed)
Emergency Medicine Provider Triage Evaluation Note  Brittany Cummings , a 55 y.o. female  was evaluated in triage.  Pt complains of abdominal pain, nausea vomiting.  History of gastric bypass..  Review of Systems  Positive: Positive abdominal pain, nausea vomiting Negative: No fever, chills  Physical Exam  BP (!) 156/73   Pulse 60   Temp 99.3 F (37.4 C)   Resp 18   LMP 11/13/2009   SpO2 100%  Gen:   Awake, no distress   Resp:  Normal effort  MSK:   Moves extremities without difficulty  Other:  Abdomen tender in the epigastric area  Medical Decision Making  Medically screening exam initiated at 2:06 PM.  Appropriate orders placed.  Brittany Cummings was informed that the remainder of the evaluation will be completed by another provider, this initial triage assessment does not replace that evaluation, and the importance of remaining in the ED until their evaluation is complete.     Versie Starks, PA-C 07/04/21 1408    Carrie Mew, MD 07/04/21 [phone]

## 2022-09-02 ENCOUNTER — Other Ambulatory Visit: Payer: Self-pay

## 2022-09-02 ENCOUNTER — Emergency Department: Payer: Medicare Other

## 2022-09-02 ENCOUNTER — Encounter: Payer: Self-pay | Admitting: Emergency Medicine

## 2022-09-02 ENCOUNTER — Emergency Department
Admission: EM | Admit: 2022-09-02 | Discharge: 2022-09-02 | Disposition: A | Discharge status: Home / Self Care | Payer: Medicare Other | Attending: Emergency Medicine | Admitting: Emergency Medicine

## 2022-09-02 DIAGNOSIS — R109 Unspecified abdominal pain: Secondary | ICD-10-CM

## 2022-09-02 DIAGNOSIS — E119 Type 2 diabetes mellitus without complications: Secondary | ICD-10-CM | POA: Diagnosis not present

## 2022-09-02 LAB — URINALYSIS, ROUTINE W REFLEX MICROSCOPIC
Bilirubin Urine: NEGATIVE
Glucose, UA: NEGATIVE mg/dL
Hgb urine dipstick: NEGATIVE
Ketones, ur: NEGATIVE mg/dL
Leukocytes,Ua: NEGATIVE
Nitrite: NEGATIVE
Protein, ur: NEGATIVE mg/dL
Specific Gravity, Urine: 1.009 (ref 1.005–1.030)
pH: 5 (ref 5.0–8.0)

## 2022-09-02 LAB — COMPREHENSIVE METABOLIC PANEL
ALT: 16 U/L (ref 0–44)
AST: 21 U/L (ref 15–41)
Albumin: 3.4 g/dL — ABNORMAL LOW (ref 3.5–5.0)
Alkaline Phosphatase: 68 U/L (ref 38–126)
Anion gap: 8 (ref 5–15)
BUN: 13 mg/dL (ref 6–20)
CO2: 25 mmol/L (ref 22–32)
Calcium: 8.4 mg/dL — ABNORMAL LOW (ref 8.9–10.3)
Chloride: 109 mmol/L (ref 98–111)
Creatinine, Ser: 0.57 mg/dL (ref 0.44–1.00)
GFR, Estimated: >60 mL/min (ref >60)
Glucose, Bld: 130 mg/dL — ABNORMAL HIGH (ref 70–99)
Potassium: 3.3 mmol/L — ABNORMAL LOW (ref 3.5–5.1)
Sodium: 142 mmol/L (ref 135–145)
Total Bilirubin: 0.6 mg/dL (ref 0.3–1.2)
Total Protein: 6.9 g/dL (ref 6.5–8.1)

## 2022-09-02 LAB — CBC
HCT: 43.9 % (ref 36.0–46.0)
Hemoglobin: 14 g/dL (ref 12.0–15.0)
MCH: 27.1 pg (ref 26.0–34.0)
MCHC: 31.9 g/dL (ref 30.0–36.0)
MCV: 85.1 fL (ref 80.0–100.0)
Platelets: 214 10*3/uL (ref 150–400)
RBC: 5.16 MIL/uL — ABNORMAL HIGH (ref 3.87–5.11)
RDW: 13.1 % (ref 11.5–15.5)
WBC: 10.6 10*3/uL — ABNORMAL HIGH (ref 4.0–10.5)
nRBC: 0 % (ref 0.0–0.2)

## 2022-09-02 LAB — LIPASE, BLOOD: Lipase: 34 U/L (ref 11–51)

## 2022-09-02 MED ORDER — IOHEXOL 300 MG/ML  SOLN
100.0000 mL | Freq: Once | INTRAMUSCULAR | Status: AC | PRN
  Administered 2022-09-02: 100 mL via INTRAVENOUS

## 2022-09-02 MED ORDER — OXYCODONE-ACETAMINOPHEN 5-325 MG PO TABS: 1.0000 | ORAL_TABLET | Freq: Three times a day (TID) | ORAL | 0 refills | Status: AC | PRN

## 2022-09-02 NOTE — ED Triage Notes (Signed)
Pt via POV from home. Pt c/o intermittent abd pain that started in Jan but states has gotten worse in the past week. Denies NVD. Pt has no gallbladder or appendix. States the pain is worse when she lays down. Denies urinary symptoms. Pt is A&OX4 and NAD

## 2022-09-02 NOTE — ED Notes (Addendum)
Pt has pain in abdomen are both anterior and posterior on right side. Pt says this has been an ongoing problem since January put has gotten worse this past week much more extreme pain making it a 10 on the pain scale. Pt also states she has a hx of gastric ulcers. States BMs are normal and urine also states no N/V/D.

## 2022-09-02 NOTE — ED Provider Notes (Signed)
[facility] Provider Note    Event Date/Time   First MD Initiated Contact with Patient 09/02/22 0813     (approximate)   History   Abdominal Pain   HPI  Brittany Cummings is a 56 y.o. female with a history of diabetes chronic neck pain, chronic abdominal pain, chronic shoulder pain, chronic lumbar radicular pain who presents with complaints of right-sided flank pain.  Patient reports it is worse when she lies on her right side.  She reports it has been severe for approximately 10 days has been intermittent for months.  No dysuria, no hematuria reported.     Physical Exam   Triage Vital Signs: ED Triage Vitals  Enc Vitals Group     BP 09/02/22 0802 (!) 172/86     Pulse Rate 09/02/22 0802 71     Resp 09/02/22 0802 20     Temp 09/02/22 0804 98.4 F (36.9 C)     Temp src --      SpO2 09/02/22 0802 98 %     Weight 09/02/22 0800 67.6 kg (149 lb)     Height 09/02/22 0800 1.6 m ('5\' 3"'$ )     Head Circumference --      Peak Flow --      Pain Score 09/02/22 0800 10     Pain Loc --      Pain Edu? --      Excl. in [place]? --     Most recent vital signs: Vitals:   09/02/22 1030 09/02/22 1045  BP: (!) 155/67   Pulse: 65 78  Resp: 18   Temp: 98.4 F (36.9 C)   SpO2: 97% 98%     General: Awake, no distress.  CV:  Good peripheral perfusion.  Resp:  Normal effort.  Abd:  No distention.  No CVA tenderness, tenderness to the right inferior flank, no masses or abnormalities palpated Other:     ED Results / Procedures / Treatments   Labs (all labs ordered are listed, but only abnormal results are displayed) Labs Reviewed  COMPREHENSIVE METABOLIC PANEL - Abnormal; Notable for the following components:      Result Value   Potassium 3.3 (*)    Glucose, Bld 130 (*)    Calcium 8.4 (*)    Albumin 3.4 (*)    All other components within normal limits  CBC - Abnormal; Notable for the following components:   WBC 10.6 (*)    RBC 5.16 (*)    All other  components within normal limits  URINALYSIS, ROUTINE W REFLEX MICROSCOPIC - Abnormal; Notable for the following components:   Color, Urine YELLOW (*)    APPearance CLEAR (*)    All other components within normal limits  LIPASE, BLOOD  POC URINE PREG, ED     EKG     RADIOLOGY CT abdomen pelvis    PROCEDURES:  Critical Care performed:   Procedures   MEDICATIONS ORDERED IN ED: Medications  iohexol (OMNIPAQUE) 300 MG/ML solution 100 mL (100 mLs Intravenous Contrast Given 09/02/22 0944)     IMPRESSION / MDM / ASSESSMENT AND PLAN / ED COURSE  I reviewed the triage vital signs and the nursing notes. Patient's presentation is most consistent with acute presentation with potential threat to life or bodily function.  Patient presents with abdominal pain as detailed above.  Differential includes ureterolithiasis, UTI, diverticulitis/colitis, musculoskeletal pain.  Lab work reviewed and is overall reassuring.  Will obtain CT abdomen pelvis given duration of pain  CT scan reviewed, no acute findings responsible for right lower flank pain.  Discussed with patient gastrojejunal fistula and need for follow-up with her bariatric surgeon  Will provide course of analgesics for the patient no indication for admission at this time      FINAL CLINICAL IMPRESSION(S) / ED DIAGNOSES   Final diagnoses:  Flank pain     Rx / DC Orders   ED Discharge Orders          Ordered    oxyCODONE-acetaminophen (PERCOCET) 5-325 MG tablet  Every 8 hours PRN        09/02/22 1038             Note:  This document was prepared using Dragon voice recognition software and may include unintentional dictation errors.   Lavonia Drafts, MD 09/02/22 [phone]

## 2022-09-03 LAB — POC URINE PREG, ED: Preg Test, Ur: NEGATIVE

## 2023-01-18 ENCOUNTER — Other Ambulatory Visit: Payer: Self-pay | Admitting: Internal Medicine

## 2023-01-18 DIAGNOSIS — R0609 Other forms of dyspnea: Secondary | ICD-10-CM

## 2023-01-18 DIAGNOSIS — F17209 Nicotine dependence, unspecified, with unspecified nicotine-induced disorders: Secondary | ICD-10-CM

## 2023-01-18 DIAGNOSIS — M5010 Cervical disc disorder with radiculopathy, unspecified cervical region: Secondary | ICD-10-CM

## 2023-01-18 DIAGNOSIS — G8929 Other chronic pain: Secondary | ICD-10-CM

## 2023-01-29 ENCOUNTER — Ambulatory Visit
Admission: RE | Admit: 2023-01-29 | Discharge: 2023-01-29 | Disposition: A | Discharge status: Home / Self Care | Payer: Medicare Other | Source: Ambulatory Visit | Attending: Internal Medicine | Admitting: Internal Medicine

## 2023-01-29 DIAGNOSIS — M5010 Cervical disc disorder with radiculopathy, unspecified cervical region: Secondary | ICD-10-CM | POA: Diagnosis present

## 2023-01-29 DIAGNOSIS — G8929 Other chronic pain: Secondary | ICD-10-CM | POA: Diagnosis present

## 2023-01-29 DIAGNOSIS — M542 Cervicalgia: Secondary | ICD-10-CM | POA: Diagnosis present

## 2023-01-29 DIAGNOSIS — R0609 Other forms of dyspnea: Secondary | ICD-10-CM | POA: Insufficient documentation

## 2023-01-29 DIAGNOSIS — F17209 Nicotine dependence, unspecified, with unspecified nicotine-induced disorders: Secondary | ICD-10-CM

## 2023-01-29 MED ORDER — IOHEXOL 300 MG/ML  SOLN
75.0000 mL | Freq: Once | INTRAMUSCULAR | Status: AC | PRN
  Administered 2023-01-29: 75 mL via INTRAVENOUS

## 2023-06-12 ENCOUNTER — Other Ambulatory Visit: Payer: Self-pay | Admitting: Internal Medicine

## 2023-06-12 DIAGNOSIS — R600 Localized edema: Secondary | ICD-10-CM

## 2023-06-13 ENCOUNTER — Ambulatory Visit
Admission: RE | Admit: 2023-06-13 | Discharge: 2023-06-13 | Disposition: A | Discharge status: Home / Self Care | Payer: Medicare Other | Source: Ambulatory Visit | Attending: Internal Medicine | Admitting: Internal Medicine

## 2023-06-13 DIAGNOSIS — R600 Localized edema: Secondary | ICD-10-CM | POA: Diagnosis not present

## 2023-08-20 ENCOUNTER — Telehealth: Payer: Self-pay | Admitting: Licensed Clinical Social Worker

## 2023-08-20 NOTE — Telephone Encounter (Signed)
[facility] and patient called seeking crisis services. LCSW provided information about [facility].

## 2024-02-19 ENCOUNTER — Other Ambulatory Visit: Payer: Self-pay | Admitting: Family Medicine

## 2024-02-19 DIAGNOSIS — M5414 Radiculopathy, thoracic region: Secondary | ICD-10-CM

## 2024-02-29 ENCOUNTER — Ambulatory Visit
Admission: RE | Admit: 2024-02-29 | Discharge: 2024-02-29 | Disposition: A | Discharge status: Home / Self Care | Source: Ambulatory Visit | Attending: Family Medicine | Admitting: Family Medicine

## 2024-02-29 DIAGNOSIS — M5414 Radiculopathy, thoracic region: Secondary | ICD-10-CM

## 2024-11-19 ENCOUNTER — Encounter: Payer: Self-pay | Admitting: Internal Medicine
